# Patient Record
Sex: Female | Born: 1945 | Race: White | Hispanic: No | Marital: Married | Smoking: Never smoker
Health system: Southern US, Community
[De-identification: ages and names within clinical notes are randomized; demographics above are authoritative.]

## PROBLEM LIST (undated history)

## (undated) DIAGNOSIS — Z8719 Personal history of other diseases of the digestive system: Secondary | ICD-10-CM

## (undated) DIAGNOSIS — M8580 Other specified disorders of bone density and structure, unspecified site: Secondary | ICD-10-CM

## (undated) DIAGNOSIS — K219 Gastro-esophageal reflux disease without esophagitis: Secondary | ICD-10-CM

## (undated) DIAGNOSIS — K5730 Diverticulosis of large intestine without perforation or abscess without bleeding: Secondary | ICD-10-CM

## (undated) DIAGNOSIS — M5416 Radiculopathy, lumbar region: Secondary | ICD-10-CM

## (undated) DIAGNOSIS — F419 Anxiety disorder, unspecified: Secondary | ICD-10-CM

## (undated) DIAGNOSIS — K5903 Drug induced constipation: Secondary | ICD-10-CM

## (undated) DIAGNOSIS — Z9109 Other allergy status, other than to drugs and biological substances: Secondary | ICD-10-CM

## (undated) DIAGNOSIS — R112 Nausea with vomiting, unspecified: Secondary | ICD-10-CM

## (undated) DIAGNOSIS — R42 Dizziness and giddiness: Secondary | ICD-10-CM

## (undated) DIAGNOSIS — E785 Hyperlipidemia, unspecified: Secondary | ICD-10-CM

## (undated) DIAGNOSIS — Z9889 Other specified postprocedural states: Secondary | ICD-10-CM

## (undated) HISTORY — PX: COLONOSCOPY W/ POLYPECTOMY: SHX1380

## (undated) HISTORY — DX: Hyperlipidemia, unspecified: E78.5

## (undated) HISTORY — DX: Diverticulosis of large intestine without perforation or abscess without bleeding: K57.30

## (undated) HISTORY — DX: Radiculopathy, lumbar region: M54.16

## (undated) HISTORY — DX: Other specified disorders of bone density and structure, unspecified site: M85.80

## (undated) HISTORY — PX: TUBAL LIGATION: SHX77

## (undated) HISTORY — DX: Gastro-esophageal reflux disease without esophagitis: K21.9

---

## 1976-09-04 HISTORY — PX: NASAL SINUS SURGERY: SHX719

## 1984-09-04 HISTORY — PX: ABDOMINAL HYSTERECTOMY: SHX81

## 1995-05-06 DIAGNOSIS — E785 Hyperlipidemia, unspecified: Secondary | ICD-10-CM

## 1995-05-06 HISTORY — DX: Hyperlipidemia, unspecified: E78.5

## 2002-01-23 HISTORY — PX: HAND SURGERY: SHX662

## 2002-01-28 ENCOUNTER — Emergency Department (HOSPITAL_COMMUNITY): Admission: AC | Admit: 2002-01-28 | Discharge: 2002-01-28 | Payer: Self-pay

## 2002-01-28 ENCOUNTER — Encounter: Payer: Self-pay | Admitting: Emergency Medicine

## 2003-09-05 DIAGNOSIS — K5730 Diverticulosis of large intestine without perforation or abscess without bleeding: Secondary | ICD-10-CM

## 2003-09-05 HISTORY — DX: Diverticulosis of large intestine without perforation or abscess without bleeding: K57.30

## 2004-06-20 ENCOUNTER — Ambulatory Visit: Payer: Self-pay | Admitting: Obstetrics and Gynecology

## 2004-07-25 ENCOUNTER — Ambulatory Visit: Payer: Self-pay | Admitting: Family Medicine

## 2004-09-23 ENCOUNTER — Ambulatory Visit: Payer: Self-pay | Admitting: Family Medicine

## 2004-10-05 ENCOUNTER — Ambulatory Visit: Payer: Self-pay | Admitting: Family Medicine

## 2004-10-05 LAB — CONVERTED CEMR LAB: Hgb A1c MFr Bld: 5.7 %

## 2004-10-12 ENCOUNTER — Ambulatory Visit: Payer: Self-pay | Admitting: Family Medicine

## 2004-10-14 ENCOUNTER — Ambulatory Visit: Payer: Self-pay | Admitting: Family Medicine

## 2004-12-03 ENCOUNTER — Encounter: Payer: Self-pay | Admitting: Family Medicine

## 2004-12-03 LAB — CONVERTED CEMR LAB

## 2005-06-04 ENCOUNTER — Encounter: Payer: Self-pay | Admitting: Family Medicine

## 2005-06-04 LAB — CONVERTED CEMR LAB
Hgb A1c MFr Bld: 5.5 %
Urinalysis: 2.3

## 2005-06-13 ENCOUNTER — Ambulatory Visit: Payer: Self-pay | Admitting: Family Medicine

## 2005-06-15 ENCOUNTER — Ambulatory Visit: Payer: Self-pay | Admitting: Family Medicine

## 2005-06-29 ENCOUNTER — Ambulatory Visit: Payer: Self-pay | Admitting: Family Medicine

## 2005-07-20 ENCOUNTER — Ambulatory Visit: Payer: Self-pay | Admitting: Obstetrics and Gynecology

## 2005-10-09 ENCOUNTER — Ambulatory Visit: Payer: Self-pay | Admitting: Family Medicine

## 2006-01-02 ENCOUNTER — Encounter: Payer: Self-pay | Admitting: Family Medicine

## 2006-01-02 LAB — CONVERTED CEMR LAB: Hgb A1c MFr Bld: 6 %

## 2006-01-03 ENCOUNTER — Ambulatory Visit: Payer: Self-pay | Admitting: Family Medicine

## 2006-01-25 ENCOUNTER — Ambulatory Visit: Payer: Self-pay | Admitting: Family Medicine

## 2006-07-05 ENCOUNTER — Encounter: Payer: Self-pay | Admitting: Family Medicine

## 2006-07-05 LAB — CONVERTED CEMR LAB: Urinalysis: 4.4

## 2006-07-16 ENCOUNTER — Ambulatory Visit: Payer: Self-pay | Admitting: Family Medicine

## 2006-07-16 LAB — CONVERTED CEMR LAB: Hgb A1c MFr Bld: 6.4 %

## 2006-07-18 ENCOUNTER — Ambulatory Visit: Payer: Self-pay | Admitting: Family Medicine

## 2006-07-18 DIAGNOSIS — F411 Generalized anxiety disorder: Secondary | ICD-10-CM | POA: Insufficient documentation

## 2006-07-31 ENCOUNTER — Ambulatory Visit: Payer: Self-pay | Admitting: Obstetrics and Gynecology

## 2006-10-03 ENCOUNTER — Ambulatory Visit: Payer: Self-pay | Admitting: Family Medicine

## 2006-10-25 ENCOUNTER — Ambulatory Visit: Payer: Self-pay | Admitting: Family Medicine

## 2006-11-21 ENCOUNTER — Ambulatory Visit: Payer: Self-pay | Admitting: Family Medicine

## 2006-12-03 ENCOUNTER — Ambulatory Visit: Payer: Self-pay | Admitting: Unknown Physician Specialty

## 2007-01-07 ENCOUNTER — Encounter: Payer: Self-pay | Admitting: Family Medicine

## 2007-01-07 DIAGNOSIS — K219 Gastro-esophageal reflux disease without esophagitis: Secondary | ICD-10-CM

## 2007-01-07 DIAGNOSIS — E119 Type 2 diabetes mellitus without complications: Secondary | ICD-10-CM

## 2007-01-09 DIAGNOSIS — J309 Allergic rhinitis, unspecified: Secondary | ICD-10-CM | POA: Insufficient documentation

## 2007-01-09 DIAGNOSIS — N6019 Diffuse cystic mastopathy of unspecified breast: Secondary | ICD-10-CM

## 2007-01-09 DIAGNOSIS — N958 Other specified menopausal and perimenopausal disorders: Secondary | ICD-10-CM | POA: Insufficient documentation

## 2007-01-14 ENCOUNTER — Ambulatory Visit: Payer: Self-pay | Admitting: Family Medicine

## 2007-01-14 LAB — CONVERTED CEMR LAB
AST: 18 units/L (ref 0–37)
CO2: 28 meq/L (ref 19–32)
Calcium: 9.2 mg/dL (ref 8.4–10.5)
GFR calc Af Amer: 94 mL/min
GFR calc non Af Amer: 78 mL/min
Glucose, Bld: 153 mg/dL — ABNORMAL HIGH (ref 70–99)
Sodium: 144 meq/L (ref 135–145)

## 2007-01-16 ENCOUNTER — Ambulatory Visit: Payer: Self-pay | Admitting: Family Medicine

## 2007-03-21 ENCOUNTER — Ambulatory Visit: Payer: Self-pay | Admitting: Family Medicine

## 2007-03-21 ENCOUNTER — Encounter (INDEPENDENT_AMBULATORY_CARE_PROVIDER_SITE_OTHER): Payer: Self-pay | Admitting: *Deleted

## 2007-04-02 ENCOUNTER — Telehealth (INDEPENDENT_AMBULATORY_CARE_PROVIDER_SITE_OTHER): Payer: Self-pay | Admitting: *Deleted

## 2007-06-03 ENCOUNTER — Telehealth (INDEPENDENT_AMBULATORY_CARE_PROVIDER_SITE_OTHER): Payer: Self-pay | Admitting: *Deleted

## 2007-07-26 ENCOUNTER — Ambulatory Visit: Payer: Self-pay | Admitting: Family Medicine

## 2007-07-26 DIAGNOSIS — E782 Mixed hyperlipidemia: Secondary | ICD-10-CM | POA: Insufficient documentation

## 2007-07-27 LAB — CONVERTED CEMR LAB
ALT: 12 units/L (ref 0–35)
AST: 17 units/L (ref 0–37)
CO2: 28 meq/L (ref 19–32)
Calcium: 9.7 mg/dL (ref 8.4–10.5)
Hgb A1c MFr Bld: 6.3 % — ABNORMAL HIGH (ref 4.6–6.0)
Microalb, Ur: 1.2 mg/dL (ref 0.0–1.9)
Potassium: 4.6 meq/L (ref 3.5–5.1)
Total CHOL/HDL Ratio: 4.2
Triglycerides: 322 mg/dL (ref 0–149)
VLDL: 64 mg/dL — ABNORMAL HIGH (ref 0–40)

## 2007-07-31 ENCOUNTER — Ambulatory Visit: Payer: Self-pay | Admitting: Family Medicine

## 2007-08-15 ENCOUNTER — Ambulatory Visit: Payer: Self-pay | Admitting: Obstetrics and Gynecology

## 2007-08-23 ENCOUNTER — Ambulatory Visit: Payer: Self-pay | Admitting: Family Medicine

## 2007-08-23 LAB — FECAL OCCULT BLOOD, GUAIAC: Fecal Occult Blood: NEGATIVE

## 2007-08-26 ENCOUNTER — Encounter (INDEPENDENT_AMBULATORY_CARE_PROVIDER_SITE_OTHER): Payer: Self-pay | Admitting: *Deleted

## 2007-09-06 ENCOUNTER — Telehealth (INDEPENDENT_AMBULATORY_CARE_PROVIDER_SITE_OTHER): Payer: Self-pay | Admitting: *Deleted

## 2007-10-20 ENCOUNTER — Other Ambulatory Visit: Payer: Self-pay

## 2007-10-20 ENCOUNTER — Observation Stay: Payer: Self-pay | Admitting: Internal Medicine

## 2007-10-21 ENCOUNTER — Ambulatory Visit: Payer: Self-pay | Admitting: Internal Medicine

## 2007-10-21 ENCOUNTER — Encounter: Payer: Self-pay | Admitting: Family Medicine

## 2007-11-22 ENCOUNTER — Ambulatory Visit: Payer: Self-pay | Admitting: Family Medicine

## 2007-11-22 LAB — CONVERTED CEMR LAB: Hgb A1c MFr Bld: 6.3 % — ABNORMAL HIGH (ref 4.6–6.0)

## 2007-11-27 ENCOUNTER — Ambulatory Visit: Payer: Self-pay | Admitting: Family Medicine

## 2007-12-27 ENCOUNTER — Ambulatory Visit: Payer: Self-pay | Admitting: Family Medicine

## 2008-05-18 ENCOUNTER — Telehealth: Payer: Self-pay | Admitting: Family Medicine

## 2008-08-06 ENCOUNTER — Ambulatory Visit: Payer: Self-pay | Admitting: Family Medicine

## 2008-08-06 LAB — CONVERTED CEMR LAB
ALT: 17 units/L (ref 0–35)
AST: 27 units/L (ref 0–37)
BUN: 10 mg/dL (ref 6–23)
Basophils Relative: 0.5 % (ref 0.0–3.0)
CO2: 27 meq/L (ref 19–32)
Calcium: 9.2 mg/dL (ref 8.4–10.5)
Chloride: 109 meq/L (ref 96–112)
Creatinine, Ser: 0.8 mg/dL (ref 0.4–1.2)
Creatinine,U: 128.5 mg/dL
Direct LDL: 109.8 mg/dL
Eosinophils Relative: 2.1 % (ref 0.0–5.0)
Glucose, Bld: 128 mg/dL — ABNORMAL HIGH (ref 70–99)
Hemoglobin: 14.7 g/dL (ref 12.0–15.0)
Lymphocytes Relative: 29.9 % (ref 12.0–46.0)
MCHC: 34.8 g/dL (ref 30.0–36.0)
Microalb, Ur: 0.6 mg/dL (ref 0.0–1.9)
Monocytes Relative: 7.8 % (ref 3.0–12.0)
Neutro Abs: 4.7 10*3/uL (ref 1.4–7.7)
Neutrophils Relative %: 59.7 % (ref 43.0–77.0)
RBC: 4.85 M/uL (ref 3.87–5.11)
Total CHOL/HDL Ratio: 4.2
Total Protein: 6.9 g/dL (ref 6.0–8.3)
VLDL: 41 mg/dL — ABNORMAL HIGH (ref 0–40)
WBC: 7.8 10*3/uL (ref 4.5–10.5)

## 2008-08-10 ENCOUNTER — Ambulatory Visit: Payer: Self-pay | Admitting: Family Medicine

## 2008-08-12 ENCOUNTER — Telehealth: Payer: Self-pay | Admitting: Family Medicine

## 2008-09-15 ENCOUNTER — Ambulatory Visit: Payer: Self-pay | Admitting: Obstetrics and Gynecology

## 2008-09-15 ENCOUNTER — Encounter: Payer: Self-pay | Admitting: Family Medicine

## 2008-09-22 ENCOUNTER — Ambulatory Visit: Payer: Self-pay | Admitting: Family Medicine

## 2008-09-30 ENCOUNTER — Ambulatory Visit: Payer: Self-pay | Admitting: Family Medicine

## 2008-12-07 ENCOUNTER — Telehealth: Payer: Self-pay | Admitting: Family Medicine

## 2009-01-18 ENCOUNTER — Telehealth: Payer: Self-pay | Admitting: Family Medicine

## 2009-02-17 ENCOUNTER — Ambulatory Visit: Payer: Self-pay | Admitting: Family Medicine

## 2009-02-17 LAB — CONVERTED CEMR LAB
Basophils Absolute: 0 10*3/uL (ref 0.0–0.1)
Basophils Relative: 0.6 % (ref 0.0–3.0)
HCT: 42.5 % (ref 36.0–46.0)
Hemoglobin: 14.8 g/dL (ref 12.0–15.0)
Lymphocytes Relative: 26.6 % (ref 12.0–46.0)
Lymphs Abs: 2.2 10*3/uL (ref 0.7–4.0)
MCHC: 34.9 g/dL (ref 30.0–36.0)
Monocytes Relative: 9.6 % (ref 3.0–12.0)
Neutro Abs: 4.9 10*3/uL (ref 1.4–7.7)
RBC: 4.83 M/uL (ref 3.87–5.11)
RDW: 12.8 % (ref 11.5–14.6)

## 2009-02-24 ENCOUNTER — Ambulatory Visit: Payer: Self-pay | Admitting: Family Medicine

## 2009-04-05 ENCOUNTER — Telehealth: Payer: Self-pay | Admitting: Family Medicine

## 2009-07-21 ENCOUNTER — Ambulatory Visit: Payer: Self-pay | Admitting: Family Medicine

## 2009-07-22 LAB — CONVERTED CEMR LAB
ALT: 17 units/L (ref 0–35)
Albumin: 4 g/dL (ref 3.5–5.2)
Alkaline Phosphatase: 66 units/L (ref 39–117)
BUN: 10 mg/dL (ref 6–23)
Basophils Relative: 0.9 % (ref 0.0–3.0)
Bilirubin, Direct: 0 mg/dL (ref 0.0–0.3)
Direct LDL: 132.4 mg/dL
Eosinophils Absolute: 0.1 10*3/uL (ref 0.0–0.7)
Eosinophils Relative: 1.7 % (ref 0.0–5.0)
GFR calc non Af Amer: 67.16 mL/min (ref >60)
HCT: 45.3 % (ref 36.0–46.0)
Hemoglobin: 14.9 g/dL (ref 12.0–15.0)
Hgb A1c MFr Bld: 6.4 % (ref 4.6–6.5)
Lymphs Abs: 2.3 10*3/uL (ref 0.7–4.0)
MCHC: 32.8 g/dL (ref 30.0–36.0)
MCV: 90.5 fL (ref 78.0–100.0)
Microalb, Ur: 1.3 mg/dL (ref 0.0–1.9)
Monocytes Absolute: 0.6 10*3/uL (ref 0.1–1.0)
Neutro Abs: 5.1 10*3/uL (ref 1.4–7.7)
Potassium: 4.8 meq/L (ref 3.5–5.1)
RBC: 5.01 M/uL (ref 3.87–5.11)
Sodium: 146 meq/L — ABNORMAL HIGH (ref 135–145)
Total Protein: 7.2 g/dL (ref 6.0–8.3)
Vit D, 25-Hydroxy: 8 ng/mL — ABNORMAL LOW (ref 30–89)
WBC: 8.2 10*3/uL (ref 4.5–10.5)

## 2009-08-04 ENCOUNTER — Ambulatory Visit: Payer: Self-pay | Admitting: Family Medicine

## 2009-10-12 ENCOUNTER — Ambulatory Visit: Payer: Self-pay | Admitting: Family Medicine

## 2009-10-12 DIAGNOSIS — E559 Vitamin D deficiency, unspecified: Secondary | ICD-10-CM | POA: Insufficient documentation

## 2009-10-12 LAB — CONVERTED CEMR LAB
ALT: 13 units/L (ref 0–35)
AST: 19 units/L (ref 0–37)

## 2009-10-13 LAB — CONVERTED CEMR LAB: Vit D, 25-Hydroxy: 41 ng/mL

## 2009-12-06 ENCOUNTER — Ambulatory Visit: Payer: Self-pay | Admitting: Family Medicine

## 2009-12-06 LAB — CONVERTED CEMR LAB
Cholesterol: 223 mg/dL — ABNORMAL HIGH (ref 0–200)
Direct LDL: 146.1 mg/dL
Total CHOL/HDL Ratio: 4

## 2009-12-07 LAB — CONVERTED CEMR LAB: Vit D, 25-Hydroxy: 46 ng/mL (ref 30–89)

## 2009-12-09 ENCOUNTER — Ambulatory Visit: Payer: Self-pay | Admitting: Family Medicine

## 2009-12-15 ENCOUNTER — Ambulatory Visit: Payer: Self-pay | Admitting: Obstetrics and Gynecology

## 2009-12-21 ENCOUNTER — Ambulatory Visit: Payer: Self-pay | Admitting: Obstetrics and Gynecology

## 2009-12-24 ENCOUNTER — Encounter: Payer: Self-pay | Admitting: Family Medicine

## 2010-03-09 ENCOUNTER — Telehealth: Payer: Self-pay | Admitting: Family Medicine

## 2010-04-07 ENCOUNTER — Encounter (INDEPENDENT_AMBULATORY_CARE_PROVIDER_SITE_OTHER): Payer: Self-pay | Admitting: *Deleted

## 2010-05-31 ENCOUNTER — Ambulatory Visit: Payer: Self-pay | Admitting: Internal Medicine

## 2010-06-23 ENCOUNTER — Ambulatory Visit: Payer: Self-pay | Admitting: Obstetrics and Gynecology

## 2010-07-21 ENCOUNTER — Ambulatory Visit: Payer: Self-pay | Admitting: Family Medicine

## 2010-07-22 ENCOUNTER — Ambulatory Visit: Payer: Self-pay | Admitting: Family Medicine

## 2010-07-27 ENCOUNTER — Encounter: Payer: Self-pay | Admitting: Family Medicine

## 2010-08-08 ENCOUNTER — Ambulatory Visit: Payer: Self-pay | Admitting: Family Medicine

## 2010-08-09 LAB — CONVERTED CEMR LAB
Albumin: 4 g/dL (ref 3.5–5.2)
Alkaline Phosphatase: 66 units/L (ref 39–117)
BUN: 17 mg/dL (ref 6–23)
Calcium: 9.3 mg/dL (ref 8.4–10.5)
Creatinine, Ser: 1 mg/dL (ref 0.4–1.2)
Creatinine,U: 141.9 mg/dL
GFR calc non Af Amer: 57.93 mL/min — ABNORMAL LOW (ref >60.00)
Glucose, Bld: 154 mg/dL — ABNORMAL HIGH (ref 70–99)
HDL: 43.4 mg/dL (ref >39.00)
Hgb A1c MFr Bld: 7.1 % — ABNORMAL HIGH (ref 4.6–6.5)
Microalb Creat Ratio: 0.4 mg/g (ref 0.0–30.0)
Microalb, Ur: 0.5 mg/dL (ref 0.0–1.9)
Sodium: 139 meq/L (ref 135–145)
Triglycerides: 318 mg/dL — ABNORMAL HIGH (ref 0.0–149.0)

## 2010-08-10 ENCOUNTER — Ambulatory Visit: Payer: Self-pay | Admitting: Family Medicine

## 2010-08-10 DIAGNOSIS — M7710 Lateral epicondylitis, unspecified elbow: Secondary | ICD-10-CM | POA: Insufficient documentation

## 2010-08-17 ENCOUNTER — Encounter: Payer: Self-pay | Admitting: Family Medicine

## 2010-10-04 NOTE — Assessment & Plan Note (Signed)
Summary: Sarah Velasquez PPD GIVE TO [name]  Nurse Visit   Allergies: 1)  ! Omeprazole (Omeprazole) 2)  Demerol (Meperidine Hcl) 3)  * Tricor (Fenofibrate Micronized) 4)  Zithromax (Azithromycin)  Immunizations Administered:  PPD Skin Test:    Vaccine Type: PPD    Site: left forearm    Mfr: Sanofi Pasteur    Dose: 0.1 ml    Route: ID    Given by: Delilah Shan CMA (AAMA)    Exp. Date: 07/07/2011    Lot #: [id number]  PPD Results    Date of reading: 07/25/2010    Interpretation: negative  Orders Added: 1)  TB Skin Test [86580] 2)  Admin 1st Vaccine [47829]

## 2010-10-04 NOTE — Assessment & Plan Note (Signed)
Summary: 4 MONTH FOLLOW UP/[name]   Vital Signs:  Patient profile:   65 year old female Weight:      173.75 pounds BMI:     29.47 Temp:     97.9 degrees F oral Pulse rate:   76 / minute Pulse rhythm:   regular BP sitting:   118 / 74  (left arm) Cuff size:   large  Vitals Entered By: Sydell Axon LPN (December 09, 1608 8:19 AM) CC: 4 Month follow-up after lab work   History of Present Illness: Pt here for followup. She lost her job and started getting insurance last week, paying for it herself...she is paying for her insurance via [employer] and her husband must be insured also and is demented and uncoverable. She is depressed about work and jobs but cannot find a job...is currently on unemployment. She has taken a class for substitute teaching.  She checks her sugar at home.  Allergies: 1)  ! Omeprazole (Omeprazole) 2)  Demerol (Meperidine Hcl) 3)  * Tricor (Fenofibrate Micronized) 4)  Zithromax (Azithromycin)   Impression & Recommendations:  Problem # 1:  UNSPECIFIED VITAMIN D DEFICIENCY (ICD-268.9) Assessment Improved Now go to Vit D 1000Iu two times a day. Will check Vit D again next Fall.  Problem # 2:  MIXED HYPERLIPIDEMIA (ICD-272.2) Assessment: Deteriorated  Simvastatin not doing the job. She wants to go back to Vytorin which helped her. Scipt sent.  She has tolerated this ok in the past. Will check again in the Fall. The following medications were removed from the medication list:    Simvastatin 80 Mg Tabs (Simvastatin) ..... One tab by mouth at night. Her updated medication list for this problem includes:    Vytorin 10-80 Mg Tabs (Ezetimibe-simvastatin) ..... One tab by mouth at night  Labs Reviewed: SGOT: 22 (12/06/2009)   SGPT: 15 (12/06/2009)   HDL:52.50 (12/06/2009), 47.30 (07/21/2009)  LDL:DEL (08/06/2008), DEL (07/26/2007)  Chol:223 (12/06/2009), 204 (07/21/2009)  Trig:286.0 (12/06/2009), 236.0 (07/21/2009)  Problem # 3:  DIABETES MELLITUS, TYPE II  (ICD-250.00) Assessment: Unchanged Stable. Script written for new machine. Nos currently stable, typically running 120's in AM, occas 140. If nos trend higher, incr Metformin as dir, incr AM if PM nos high, PM if AM nos high...discussed. Will recheck in the Fall. Her updated medication list for this problem includes:    Metformin Hcl 500 Mg Tabs (Metformin hcl) ..... One tab by mouth at am and pm.  Complete Medication List: 1)  Prevacid 30 Mg Cpdr (Lansoprazole) .... Take one by mouth  daily 2)  Estradiol 2 Mg Tabs (Estradiol) .... Take 1 tablet by mouth once a day 3)  Zyrtec 10 Mg Tabs (Cetirizine hcl) .... One tab by mouth once daily 4)  Metformin Hcl 500 Mg Tabs (Metformin hcl) .... One tab by mouth at am and pm. 5)  Zantac 150 Maximum Strength 150 Mg Tabs (Ranitidine hcl) .Marland Kitchen.. 1 tablet at bedtime 6)  Buspar 15 Mg Tabs (Buspirone hcl) .Marland Kitchen.. 1 tab by mouth  two times a day,  take consistently with regard to food. 7)  Flonase 50 Mcg/act Susp (Fluticasone propionate) .... 2 sprays each nostril  daily 8)  Astepro 0.15 % Soln (Azelastine hcl) .... 2 sprays each nostril two times a day 9)  Freestyle Test Strp (Glucose blood) .... Check sugar daily and if needed for  for symptoms of elevated sugar are low sugar icd9 code 250.00 10)  Freestyle Lancets Misc (Lancets) .... Use as directed two times a  day 11)  Ergocalciferol 50000 Unit Caps (Ergocalciferol) .... One tab by mouth once weekly 12)  Vytorin 10-80 Mg Tabs (Ezetimibe-simvastatin) .... One tab by mouth at night  Patient Instructions: 1)  Get new meter today. 2)  RTC in the Fall, usual time for Comp Exam. Call in May for appt. Prescriptions: VYTORIN 10-80 MG TABS (EZETIMIBE-SIMVASTATIN) one tab by mouth at night  #30 x 12   Entered and Authorized by:   Shaune Leeks MD   Signed by:   Shaune Leeks MD on 12/09/2009   Method used:   [facility]        [facility] [phone]* (retail)       [address] [place], [place]  [address]       Ph: [phone] or [phone]       Fax: [phone]   RxID:   [id number]   Current Allergies (reviewed today): ! OMEPRAZOLE (OMEPRAZOLE) DEMEROL (MEPERIDINE HCL) * TRICOR (FENOFIBRATE MICRONIZED) ZITHROMAX (AZITHROMYCIN)

## 2010-10-04 NOTE — Letter (Signed)
Summary: Nadara Eaton letter  [facility]  [address] [place], [place] [address]   Phone: [phone]  Fax: [phone]       04/07/2010 MRN: [id number]  DENECIA BRUNETTE [address] [place], [place]  [address]  Dear Ms. [facility] [place], and [facility] announce the retirement of Arta Silence, M.D., from full-time practice at the [facility] office effective March 03, 2010 and his plans of returning part-time.  It is important to Dr. Hetty Ely and to our practice that you understand that [facility] has seven physicians in our office for your health care needs.  We will continue to offer the same exceptional care that you have today.    Dr. Hetty Ely has spoken to many of you about his plans for retirement and returning part-time in the fall.   We will continue to work with you through the transition to schedule appointments for you in the office and meet the high standards that [facility] is committed to.   Again, it is with great pleasure that we share the news that Dr. Hetty Ely will return to [facility] in October of 2011 with a reduced schedule.    If you have any questions, or would like to request an appointment with one of our physicians, please call us at [phone] and press the option for Scheduling an appointment.  We take pleasure in providing you with excellent patient care and look forward to seeing you at your next office visit.  Our [facility] Physicians are:  Tillman Abide, M.D. Laurita Quint, M.D. Roxy Manns, M.D. Kerby Nora, M.D. Hannah Beat, M.D. Ruthe Mannan, M.D. We proudly welcomed Raechel Ache, M.D. and Eustaquio Boyden, M.D. to the practice in July/August 2011.  Sincerely,  [facility]

## 2010-10-04 NOTE — Assessment & Plan Note (Signed)
Summary: CPX/CLE   Vital Signs:  Patient profile:   65 year old female Weight:      175.25 pounds Temp:     97.0 degrees F oral Pulse rate:   76 / minute Pulse rhythm:   regular BP sitting:   126 / 78  (left arm) Cuff size:   large  Vitals Entered By: Sydell Axon LPN (August 10, 2010 9:29 AM) CC: 30 Minute checkup, sees Dr. Logan Bores for her GYN care, had a colonoscopy 3/08 by Dr. Mechele Collin   History of Present Illness: Pt here for Comp Exam. She sees Dr Logan Bores for Gyn care, last seen in Sep.  Hasn't had Pap in long time...told she doesn't need one. Had repeat Mammo with U/s which was then fine.  Her A1C is elevated ...no change in diet but gets minimal exercise. She is home all day. She doesn't snack much...she eats sweets and realizes she shouldn't. She has not substituted in school yet. She had orientation last week. She is still in the checkout phase. Husband is still having difficulty, especially at night...lots of stress.  Preventive Screening-Counseling & Management  Alcohol-Tobacco     Alcohol drinks/day: <1     Alcohol type: rare wine once a year     Smoking Status: never     Passive Smoke Exposure: no  Caffeine-Diet-Exercise     Caffeine use/day: rare     Does Patient Exercise: no     Type of exercise: walks     Times/week: 7  Problems Prior to Update: 1)  Lateral Epicondylitis, Left  (ICD-726.32) 2)  Diabetes Mellitus, Type II  (ICD-250.00) 3)  Mixed Hyperlipidemia  (ICD-272.2) 4)  Gerd  (ICD-530.81) 5)  Unspecified Vitamin D Deficiency  (ICD-268.9) 6)  Anxiety Disorder, Generalized  (ICD-300.02) 7)  Disorder, 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 Take One By Mouth  Daily 2)  Estradiol 2 Mg Tabs (Estradiol) .... Take 1/2  Tablet By Mouth Once A Day 3)  Zyrtec 10 Mg Tabs (Cetirizine Hcl) .... One Tab By Mouth Once Daily 4)  Metformin Hcl 500 Mg Tabs (Metformin Hcl) .... One Tab By Mouth At Am and Pm. 5)  Zantac 150 Maximum Strength 150 Mg  Tabs (Ranitidine Hcl) .Marland Kitchen.. 1 Tablet At Bedtime 6)  Buspar 15 Mg Tabs (Buspirone Hcl) .... 1/2 Tab By Mouth  Two Times A Day,  Take Consistently With Regard To Food. 7)  Freestyle Test  Strp (Glucose Blood) .... Check Sugar Daily and If Needed For  For Symptoms of Elevated Sugar Are Low Sugar Icd9 Code 250.00 8)  Freestyle Lancets  Misc (Lancets) .... Use As Directed Two Times A Day 9)  Vytorin 10-80 Mg Tabs (Ezetimibe-Simvastatin) .... One Tab By Mouth At Night 10)  Vitamin D 1000 Unit Tabs (Cholecalciferol) .Marland Kitchen.. 1 By Mouth Two Times A Day  Current Medications (verified): 1)  Prevacid 30 Mg Cpdr (Lansoprazole) .... Take One By Mouth  Daily 2)  Estradiol 2 Mg Tabs (Estradiol) .... Take 1/2  Tablet By Mouth Once A Day 3)  Zyrtec 10 Mg Tabs (Cetirizine Hcl) .... One Tab By Mouth Once Daily 4)  Metformin Hcl 500 Mg Tabs (Metformin Hcl) .... One  Tab By Mouth At Am and Pm. 5)  Zantac 150 Maximum Strength 150 Mg  Tabs (Ranitidine Hcl) .Marland Kitchen.. 1 Tablet At Bedtime 6)  Buspar 15 Mg Tabs (Buspirone Hcl) .... 1/2 Tab By Mouth  Two Times A Day,  Take Consistently With Regard To Food. 7)  Freestyle Test  Strp (Glucose Blood) .... Check Sugar Daily and If Needed For  For Symptoms of Elevated Sugar Are Low Sugar Icd9 Code 250.00 8)  Freestyle Lancets  Misc (Lancets) .... Use As Directed Two Times A Day 9)  Vytorin 10-80 Mg Tabs (Ezetimibe-Simvastatin) .... One Tab By Mouth At Night 10)  Vitamin D 1000 Unit Tabs (Cholecalciferol) .Marland Kitchen.. 1 By Mouth Two Times A Day  Allergies: 1)  ! Omeprazole (Omeprazole) 2)  Demerol (Meperidine Hcl) 3)  * Tricor (Fenofibrate Micronized) 4)  Zithromax (Azithromycin)  Past  History:  Past Medical History: Last updated: 01/07/2007 GERD Diabetes mellitus, type II Diverticulosis, colon (09/2003) Hyperlipidemia (05/1995)  Past Surgical History: Last updated: 10/25/2007 NSVDx2 BTL Sinus opn 1978 Hyst, ovaries intact Uterine prolapse  1986 EGD- H. H., esophagitis/ Sigmoidoscopy WNL (Dr. Mechele Collin)  03/04/1996 ABD. U/S WNL 10/97 EGD, Sm. H. H., multi polyps 09/16/2003 Colonoscopy polyps, multi. benign, int. hemorrhoids, divertics .09/16/03 Stress cardiolite, apical thinning, negative ischemia 09/14/2003 Colonoscoopy Ext/Int. hemorrhoids Mechele Collin) 5 years 12/03/2006 [facility]) Chest Pain R/O'd  GERD 2/15-2/16/2009 Stress Myoview negative 10/21/2007  Family History: Last updated: 03-Sep-2010 Father: Died at age 42, CABG, emphysema, esophageal and brain cancer  Mother :Died at age 54 MI Siblings: Brother Clerance Lav) A 46  MI               Sister A 31 (Etoh)  Social History: Last updated: 2010/09/03 Marital Status: Married, lives with husband Children: Son age 72 married [place], one daughter Occupation:  CSR (Museum/gallery conservator) [employer]    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    Downsized Caffeine use/day:  rare Does Patient Exercise:  no  Review of Systems General:  Complains of fatigue; denies chills, fever, sweats, weakness, and weight loss; Poor sleep from husband's disease.. Eyes:  Denies  blurring, discharge, and eye pain; chronic right eye tearing. ENT:  Denies decreased hearing, ear discharge, earache, and ringing in ears. CV:  Denies chest pain or discomfort, fainting, palpitations, shortness of breath with exertion, swelling of feet, and swelling of hands. Resp:  Denies cough, shortness of breath, and wheezing. GI:  Denies abdominal pain, bloody stools, change in bowel habits, constipation, dark tarry stools, diarrhea, indigestion, loss of appetite, nausea, vomiting, vomiting blood, and yellowish skin color. GU:  Denies discharge, dysuria, nocturia, and urinary frequency; bladder problrem with prolapsed bladder and rectum. . MS:  Denies joint pain, muscle aches, muscle weakness, and stiffness. Derm:  Denies dryness, itching, and rash. Neuro:  Denies numbness, poor balance, tingling, and tremors.  Physical Exam  General:  alert, well-developed, well-nourished, and well-hydrated.  NAD  Head:  Normocephalic and atraumatic without obvious abnormalities. No apparent alopecia or balding. Sinuses NT. Eyes:  Conjunctiva clear bilaterally.  Ears:  External ear exam shows no significant lesions or deformities.  Otoscopic examination reveals clear canals, tympanic membranes are intact bilaterally without bulging, retraction, inflammation or discharge. Hearing is grossly normal bilaterally. Nose:  External nasal examination shows no deformity or inflammation. Nasal mucosa are pink and moist without lesions or exudates. Mouth:  Oral mucosa and oropharynx without lesions or exudates.  Teeth in good repair. Neck:  No deformities, masses, or tenderness noted. Chest Wall:  No deformities, masses, or tenderness noted. Breasts:  Not done, Dr Logan Bores Lungs:  Normal respiratory effort, chest expands symmetrically. Lungs are clear to auscultation, no crackles or wheezes. Heart:  Normal rate and regular rhythm. S1 and S2 normal without gallop, murmur, click, rub or other extra sounds. Abdomen:   abdomen soft and non-tender without masses, organomegaly or hernias noted. Rectal:  Not done, Dr Sanjuana Kava:  Not done, Dr Logan Bores Msk:  No deformity or scoliosis noted of thoracic or lumbar spine.   Pulses:  R and L carotid,radial,femoral,dorsalis pedis and posterior tibial pulses are full and equal bilaterally Extremities:  No clubbing, cyanosis, edema noted with normal full range of motion of all joints.  Surgical correction of hand trauma with finger for opposable thumb on right hand. Neurologic:  No cranial nerve deficits noted. Station and gait are normal. Sensory, motor and coordinative functions appear intact. Skin:  Intact without suspicious lesions or rashes Cervical Nodes:  No lymphadenopathy noted Inguinal Nodes:  No significant adenopathy Psych:  Cognition and judgment appear intact. Alert and cooperative with normal attention span and concentration. No apparent delusions, illusions, hallucinations, anxious and tearful discussing her husband's situation and status.  Diabetes Management Exam:    Foot Exam (with socks and/or shoes not present):       Sensory-Pinprick/Light touch:          Left medial foot (L-4): normal          Left dorsal foot (L-5): normal          Left lateral foot (S-1): normal          Right medial foot (L-4): normal          Right dorsal foot (L-5): normal          Right lateral foot (S-1): normal       Sensory-Monofilament:          Left foot: normal          Right foot: normal       Inspection:          Left foot: normal          Right foot: normal       Nails:          Left foot: normal          Right foot: normal   Impression & Recommendations:  Problem # 1:  HEALTH MAINTENANCE EXAM (ICD-V70.0) Assessment Comment Only Flu vaccine administered today. Discussed Zostavax. Will give Pneumovax  next year at 89 yoa.  Problem # 2:  LATERAL EPICONDYLITIS, LEFT (ICD-726.32) Assessment: New Is currently wearing an elbow strap. Will have her  seen at [facility] for poss inj tx. Orders: Orthopedic Referral (Ortho)  Problem # 3:  DIABETES MELLITUS, TYPE II (ICD-250.00) Assessment: Deteriorated Mildly elevated over previous values. Discussed motivation and control. Will increase Metformin. Her updated medication list for this problem includes:  Metformin Hcl 1000 Mg Tabs (Metformin hcl) ..... One tab by mouth two times a day.  Labs Reviewed: Creat: 1.0 (08/08/2010)     Last Eye Exam: normal (07/20/2009) Reviewed HgBA1c results: 7.1 (08/08/2010)  6.4 (07/21/2009)  Problem # 4:  MIXED HYPERLIPIDEMIA (ICD-272.2) Assessment: Unchanged Cont curr meds. Her updated medication list for this problem includes:    Vytorin 10-80 Mg Tabs (Ezetimibe-simvastatin) ..... One tab by mouth at night  Labs Reviewed: SGOT: 37 (08/08/2010)   SGPT: 19 (08/08/2010)   HDL:43.40 (08/08/2010), 52.50 (12/06/2009)  LDL:DEL (08/06/2008), DEL (07/26/2007)  Chol:185 (08/08/2010), 223 (12/06/2009)  Trig:318.0 (08/08/2010), 286.0 (12/06/2009)  Problem # 5:  GERD (ICD-530.81) Stable, again discussed conservative measures. Her updated medication list for this problem includes:    Prevacid 30 Mg Cpdr (Lansoprazole) .Marland Kitchen... Take one by mouth  daily    Zantac 150 Maximum Strength 150 Mg Tabs (Ranitidine hcl) .Marland Kitchen... 1 tablet at bedtime  Problem # 6:  UNSPECIFIED VITAMIN D DEFICIENCY (ICD-268.9) Assessment: Improved Adequately replaced. Cont curr tx.  Problem # 7:  ANXIETY DISORDER, GENERALIZED (ICD-300.02) Assessment: Unchanged Stable, doing well given the current situation. Will refer pt to [facility] for help with husband's care. Her updated medication list for this problem includes:    Buspar 15 Mg Tabs (Buspirone hcl) .Marland Kitchen... 1/2 tab by mouth  two times a day,  take consistently with regard to food.  Complete Medication List: 1)  Prevacid 30 Mg Cpdr (Lansoprazole) .... Take one by mouth  daily 2)  Estradiol 2 Mg Tabs (Estradiol) .... Take 1/2   tablet by mouth once a day 3)  Zyrtec 10 Mg Tabs (Cetirizine hcl) .... One tab by mouth once daily 4)  Metformin Hcl 1000 Mg Tabs (Metformin hcl) .... One tab by mouth two times a day. 5)  Zantac 150 Maximum Strength 150 Mg Tabs (Ranitidine hcl) .Marland Kitchen.. 1 tablet at bedtime 6)  Buspar 15 Mg Tabs (Buspirone hcl) .... 1/2 tab by mouth  two times a day,  take consistently with regard to food. 7)  Freestyle Test Strp (Glucose blood) .... Check sugar daily and if needed for  for symptoms of elevated sugar are low sugar icd9 code 250.00 8)  Freestyle Lancets Misc (Lancets) .... Use as directed two times a day 9)  Vytorin 10-80 Mg Tabs (Ezetimibe-simvastatin) .... One tab by mouth at night 10)  Vitamin D 1000 Unit Tabs (Cholecalciferol) .Marland Kitchen.. 1 by mouth two times a day  Other Orders: Admin 1st Vaccine (14782) Flu Vaccine 49yrs + ([address])  Patient Instructions: 1)  RTC 6mos, A1C prior 250.00 2)  Refer to Ortho. 3)  Will refer to [facility] 4)  Increase Metformin to 1000mg  two times a day. 5)  Look into Shingles shot.  6)  Flu shot today. Prescriptions: METFORMIN HCL 1000 MG TABS (METFORMIN HCL) one tab by mouth two times a day.  #60 x 12   Entered and Authorized by:   Shaune Leeks MD   Signed by:   Shaune Leeks MD on 08/10/2010   Method used:   [facility]        [facility] [phone]* (retail)       [address] [place], [place]  [address]       Ph: [phone] or [phone]       Fax: [phone]   RxID:   [id number]    Orders Added: 1)  Admin 1st Vaccine [90471] 2)  Flu Vaccine 87yrs + [62130] 3)  Orthopedic Referral [Ortho] 4)  Est. Patient 40-64 years [86578]    Current Allergies (reviewed today): ! OMEPRAZOLE (OMEPRAZOLE) DEMEROL (MEPERIDINE HCL) * TRICOR (FENOFIBRATE MICRONIZED) ZITHROMAX (AZITHROMYCIN)Flu Vaccine Consent Questions     Do you have a history of severe allergic reactions to this vaccine? no    Any prior  history of allergic reactions to egg and/or gelatin? no    Do you have a sensitivity to the preservative Thimersol? no    Do you have a past history of Guillan-Barre Syndrome? no    Do you currently have an acute febrile illness? no    Have you ever had a severe reaction to latex? no    Vaccine information given and explained to patient? yes    Are you currently pregnant? no    Lot Number:[id number]   Exp Date:03/04/2011   Site Given  Left Deltoid IM (AZITHROMYCIN)

## 2010-10-04 NOTE — Letter (Signed)
Summary: [facility] / UROGYNECOLOGY NEW PATIENT CONSULT / PELVIC PROLAPSE / DR.  [facility] / UROGYNECOLOGY NEW PATIENT CONSULT / PELVIC PROLAPSE / DR. AMIE KAWASAKI   Imported By: Carin Primrose 12/28/2009 15:58:08  _____________________________________________________________________  External Attachment:    Type:   Image     Comment:   External Document

## 2010-10-04 NOTE — Progress Notes (Signed)
Summary: Rx Buspar  Phone Note Refill Request Call back at [phone] Message from:  [facility] on March 09, 2010 11:12 AM  Refills Requested: Medication #1:  BUSPAR 15 MG TABS 1 tab by mouth  two times a day  Method Requested: Electronic Initial call taken by: Sydell Axon LPN,  March 09, 1190 11:17 AM  Follow-up for Phone Call       Follow-up by: Crawford Givens MD,  March 09, 2010 2:16 PM    Prescriptions: BUSPAR 15 MG TABS (BUSPIRONE HCL) 1 tab by mouth  two times a day,  take consistently with regard to food.  #60 x 5   Entered and Authorized by:   Crawford Givens MD   Signed by:   Crawford Givens MD on 03/09/2010   Method used:   [facility]        [facility] [phone]* (retail)       [address] [place], [place]  [address]       Ph: [phone] or [phone]       Fax: [phone]   RxID:   [id number]

## 2010-10-04 NOTE — Letter (Signed)
Summary: [place] Engineer, manufacturing systems  [employer] Health Examination Certificate   Imported By: Beau Fanny 07/27/2010 08:27:47  _____________________________________________________________________  External Attachment:    Type:   Image     Comment:   External Document

## 2010-10-04 NOTE — Assessment & Plan Note (Signed)
Summary: ST/CLE   Vital Signs:  Patient profile:   65 year old female Height:      64.5 inches Weight:      176.50 pounds Temp:     98.1 degrees F oral Pulse rate:   84 / minute Pulse rhythm:   regular BP sitting:   136 / 70  (left arm) Cuff size:   large  Vitals Entered By: Selena Batten Dance CMA Duncan Dull) (May 31, 2010 3:24 PM) CC: Sore throat   History of Present Illness: CC: ST  4d h/o ST, noted white spot on right tonsil.  No documented fevers/chills but feeling diaphoretic at night.  + mild cough, congestion.  Mild ear fullness on right.  + PND and nasal drainage (clear).  + sinus pressure.    No abd pain, n/v/d.  No myalgias, arthralgias.  No sick contacts.  No smokers at home.  Has tried gargling with salty water.    Current Medications (verified): 1)  Prevacid 30 Mg Cpdr (Lansoprazole) .... Take One By Mouth  Daily 2)  Estradiol 2 Mg Tabs (Estradiol) .... Take 1/2  Tablet By Mouth Once A Day 3)  Zyrtec 10 Mg Tabs (Cetirizine Hcl) .... One Tab By Mouth Once Daily 4)  Metformin Hcl 500 Mg Tabs (Metformin Hcl) .... One Tab By Mouth At Am and Pm. 5)  Zantac 150 Maximum Strength 150 Mg  Tabs (Ranitidine Hcl) .Marland Kitchen.. 1 Tablet At Bedtime 6)  Buspar 15 Mg Tabs (Buspirone Hcl) .... 1/2 Tab By Mouth  Two Times A Day,  Take Consistently With Regard To Food. 7)  Freestyle Test  Strp (Glucose Blood) .... Check Sugar Daily and If Needed For  For Symptoms of Elevated Sugar Are Low Sugar Icd9 Code 250.00 8)  Freestyle Lancets  Misc (Lancets) .... Use As Directed Two Times A Day 9)  Vytorin 10-80 Mg Tabs (Ezetimibe-Simvastatin) .... One Tab By Mouth At Night 10)  Vitamin D 1000 Unit Tabs (Cholecalciferol) .Marland Kitchen.. 1 By Mouth Two Times A Day  Allergies: 1)  ! Omeprazole (Omeprazole) 2)  Demerol (Meperidine Hcl) 3)  * Tricor (Fenofibrate Micronized) 4)  Zithromax (Azithromycin)  Past History:  Past Medical History: Last updated: 01/07/2007 GERD Diabetes mellitus, type II Diverticulosis,  colon (09/2003) Hyperlipidemia (05/1995)  Social History: Last updated: 08/04/2009 Marital Status: Married, lives with husband Children: Son age 25 married [place], one daughter Occupation:  CSR (Museum/gallery conservator) [employer]    Let GO PMH-FH-SH reviewed for relevance  Review of Systems       per HPI  Physical Exam  General:  alert, well-developed, well-nourished, and well-hydrated.  NAD Head:  Normocephalic and atraumatic without obvious abnormalities. No apparent alopecia or balding. Sinuses NT. Eyes:  Conjunctiva clear bilaterally.  Ears:  External ear exam shows no significant lesions or deformities.  Otoscopic examination reveals clear canals, tympanic membranes are intact bilaterally without bulging, retraction, inflammation or discharge. Hearing is grossly normal bilaterally. Nose:  no mucosal edema, no mucosal erythema.    Mouth:  + pharyngeal erythema.  + pale nodule on right tonsil, no exudates Neck:  No deformities, masses, or tenderness noted. Lungs:  Normal respiratory effort, chest expands symmetrically. Lungs are clear to auscultation, no crackles or wheezes. Heart:  Normal rate and regular rhythm. S1 and S2 normal without gallop, murmur, click, rub or other extra sounds. Abdomen:  abdomen soft and non-tender without masses, organomegaly or hernias noted. Pulses:  2+ rad pulses Extremities:  No clubbing, cyanosis, edema noted  with normal full range of motion of all joints.  Surgical correction of hand trauma with finger for opposable thumb on right hand. Skin:  Intact without suspicious lesions or rashes   Impression & Recommendations:  Problem # 1:  ACUTE PHARYNGITIS (ICD-462) rapid strep negative.  viral URTI.  treat supportively - ibuprofen, nasal saline, rest and fluids.  RTC for red flags.  discussed anticipated course of illness.  ? if allergies playing component (previously prescribed flonase and astelin, not currently  taking).  Orders: Rapid Strep (98119)  Complete Medication List: 1)  Prevacid 30 Mg Cpdr (Lansoprazole) .... Take one by mouth  daily 2)  Estradiol 2 Mg Tabs (Estradiol) .... Take 1/2  tablet by mouth once a day 3)  Zyrtec 10 Mg Tabs (Cetirizine hcl) .... One tab by mouth once daily 4)  Metformin Hcl 500 Mg Tabs (Metformin hcl) .... One tab by mouth at am and pm. 5)  Zantac 150 Maximum Strength 150 Mg Tabs (Ranitidine hcl) .Marland Kitchen.. 1 tablet at bedtime 6)  Buspar 15 Mg Tabs (Buspirone hcl) .... 1/2 tab by mouth  two times a day,  take consistently with regard to food. 7)  Freestyle Test Strp (Glucose blood) .... Check sugar daily and if needed for  for symptoms of elevated sugar are low sugar icd9 code 250.00 8)  Freestyle Lancets Misc (Lancets) .... Use as directed two times a day 9)  Vytorin 10-80 Mg Tabs (Ezetimibe-simvastatin) .... One tab by mouth at night 10)  Vitamin D 1000 Unit Tabs (Cholecalciferol) .Marland Kitchen.. 1 by mouth two times a day  Patient Instructions: 1)  Rapid strep today. 2)  Ibuprofen for throat inflammation.  Nasal saline for nose irritation.  Suck on cold things like popsicles, salt water gargles.  Plenty of rest and water. 3)  return if having high fevers >101.5, trouble breathing or swallowing, or other concerns.   4)  Pleasure to meet you today, call clinic with questions.  Current Allergies (reviewed today): ! OMEPRAZOLE (OMEPRAZOLE) DEMEROL (MEPERIDINE HCL) * TRICOR (FENOFIBRATE MICRONIZED) ZITHROMAX (AZITHROMYCIN)  Laboratory Results  Date/Time Received: May 31, 2010 4:04 PM Date/Time Reported: May 31, 2010 4:04 PM   Other Tests  Rapid Strep: negative

## 2010-10-04 NOTE — Assessment & Plan Note (Signed)
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 She has no back or musculoskeletal problems. She has lots of stress lately, esp with her husband. She otherwise feels well with no complaints.  Her shots are UTD. SHe has not had a PPD.  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 Take One By Mouth  Daily 2)  Estradiol 2 Mg Tabs (Estradiol) .... Take 1/2  Tablet By Mouth Once A Day 3)  Zyrtec 10 Mg Tabs (Cetirizine Hcl) .... One Tab By Mouth Once Daily 4)  Metformin Hcl 500 Mg Tabs (Metformin Hcl) .... One Tab By Mouth At Am and Pm. 5)  Zantac 150 Maximum Strength 150 Mg  Tabs (Ranitidine Hcl) .Marland Kitchen.. 1 Tablet At Bedtime 6)  Buspar 15 Mg Tabs (Buspirone Hcl) .... 1/2 Tab By Mouth  Two Times A Day,  Take Consistently With Regard To Food. 7)  Freestyle Test  Strp (Glucose Blood) .... Check Sugar Daily and If Needed For  For Symptoms of Elevated Sugar Are Low Sugar Icd9 Code  250.00 8)  Freestyle Lancets  Misc (Lancets) .... Use As Directed Two Times A Day 9)  Vytorin 10-80 Mg Tabs (Ezetimibe-Simvastatin) .... One Tab By Mouth At Night 10)  Vitamin D 1000 Unit Tabs (Cholecalciferol) .Marland Kitchen.. 1 By Mouth Two Times A Day  Current Medications (verified): 1)  Prevacid 30 Mg Cpdr (Lansoprazole) .... Take One By Mouth  Daily 2)  Estradiol 2 Mg Tabs (Estradiol) .... Take 1/2  Tablet By Mouth Once A Day 3)  Zyrtec 10 Mg Tabs (Cetirizine Hcl) .... One Tab By Mouth Once Daily 4)  Metformin Hcl 500 Mg Tabs (Metformin Hcl) .... One Tab By Mouth At Am and Pm. 5)  Zantac 150 Maximum Strength 150 Mg  Tabs (Ranitidine Hcl) .Marland Kitchen.. 1 Tablet At Bedtime 6)  Buspar 15 Mg Tabs (Buspirone Hcl) .... 1/2 Tab By Mouth  Two Times A Day,  Take Consistently With Regard To Food. 7)  Freestyle Test  Strp (Glucose Blood) .... Check Sugar Daily and If Needed For  For Symptoms of Elevated Sugar Are Low Sugar Icd9 Code 250.00 8)  Freestyle Lancets  Misc (Lancets) .... Use As Directed Two Times A Day 9)  Vytorin 10-80 Mg Tabs (Ezetimibe-Simvastatin) .... One Tab By Mouth At Night 10)  Vitamin D 1000 Unit Tabs (Cholecalciferol) .Marland Kitchen.. 1 By Mouth Two Times A Day  Allergies: 1)  ! Omeprazole (Omeprazole) 2)  Demerol (Meperidine Hcl) 3)  * Tricor (Fenofibrate Micronized) 4)  Zithromax (Azithromycin)  Physical Exam  General:  alert, well-developed, well-nourished, and well-hydrated.  NAD Head:  Normocephalic and atraumatic without obvious abnormalities. No apparent alopecia or balding. Sinuses NT. Eyes:  Conjunctiva clear bilaterally.  Ears:  External ear exam shows no significant lesions or deformities.  Otoscopic examination reveals clear canals, tympanic membranes are intact bilaterally without bulging, retraction, inflammation or discharge. Hearing is grossly normal bilaterally. Nose:  External nasal examination shows no deformity or inflammation. Nasal mucosa are pink and moist without lesions or  exudates. Mouth:  Oral mucosa and oropharynx without lesions or exudates.  Teeth in good repair. Neck:  No deformities, masses, or tenderness noted. Chest Wall:  No deformities, masses, or tenderness noted. Lungs:  Normal respiratory effort, chest expands symmetrically. Lungs are clear to auscultation, no crackles or wheezes. Heart:  Normal rate and regular rhythm. S1 and S2 normal without gallop, murmur, click, rub or other extra sounds. Abdomen:  abdomen soft and non-tender without masses, organomegaly or hernias noted. Msk:  No deformity or scoliosis noted of thoracic or lumbar spine.   Extremities:  No clubbing, cyanosis, edema noted with normal full range of motion of all joints.  Surgical correction of hand trauma with finger for opposable thumb on right hand.   Impression & Recommendations:  Problem # 1:  ACUTE PHARYNGITIS (ICD-462) Assessment Improved Resolved.  Problem # 2:  HEALTH MAINTENANCE EXAM (ICD-V70.0) Assessment: Unchanged Form signed. PPD will be placed next week and checked (today is Thu and can't be done today).  Complete Medication List: 1)  Prevacid 30 Mg Cpdr (Lansoprazole) .... Take one by mouth  daily 2)  Estradiol 2 Mg Tabs (Estradiol) .... Take 1/2  tablet by mouth once a day 3)  Zyrtec 10 Mg Tabs (Cetirizine hcl) .... One tab by mouth once daily 4)  Metformin Hcl 500 Mg Tabs (Metformin hcl) .... One tab by mouth at am and pm. 5)  Zantac 150 Maximum Strength 150 Mg Tabs (Ranitidine hcl) .Marland Kitchen.. 1 tablet at bedtime 6)  Buspar 15 Mg Tabs (Buspirone hcl) .... 1/2 tab by mouth  two times a day,  take consistently with regard to food. 7)  Freestyle Test Strp (Glucose blood) .... Check sugar daily and if needed for  for symptoms of elevated sugar are low sugar icd9 code 250.00 8)  Freestyle Lancets Misc (Lancets) .... Use as directed two times a day 9)  Vytorin 10-80 Mg Tabs (Ezetimibe-simvastatin) .... One tab by mouth at night 10)  Vitamin D 1000 Unit Tabs  (Cholecalciferol) .Marland Kitchen.. 1 by mouth two times a day  Patient Instructions: 1)  RTC 48 hrs after PPD placement for reading and form.   Orders Added: 1)  Est. Patient Level III [16109]    Current Allergies (reviewed today): ! OMEPRAZOLE (OMEPRAZOLE) DEMEROL (MEPERIDINE HCL) * TRICOR (FENOFIBRATE MICRONIZED) ZITHROMAX (AZITHROMYCIN)

## 2010-10-06 NOTE — Consult Note (Signed)
Summary: Dr.Howard Miller,Orthopaedic,Note  Dr.Howard Miller,Orthopaedic,Note   Imported By: Beau Fanny 08/25/2010 10:44:16  _____________________________________________________________________  External Attachment:    Type:   Image     Comment:   External Document

## 2010-12-13 ENCOUNTER — Other Ambulatory Visit: Payer: Self-pay | Admitting: Family Medicine

## 2011-01-12 ENCOUNTER — Other Ambulatory Visit: Payer: Self-pay | Admitting: Family Medicine

## 2011-01-12 NOTE — Telephone Encounter (Signed)
Rx called to pharmacy

## 2011-01-21 ENCOUNTER — Encounter: Payer: Self-pay | Admitting: Family Medicine

## 2011-01-24 ENCOUNTER — Other Ambulatory Visit: Payer: Self-pay | Admitting: Family Medicine

## 2011-01-25 ENCOUNTER — Other Ambulatory Visit (INDEPENDENT_AMBULATORY_CARE_PROVIDER_SITE_OTHER): Payer: BC Managed Care – PPO | Admitting: Family Medicine

## 2011-01-25 DIAGNOSIS — E119 Type 2 diabetes mellitus without complications: Secondary | ICD-10-CM

## 2011-01-25 LAB — HEMOGLOBIN A1C: Hgb A1c MFr Bld: 7.1 % — ABNORMAL HIGH (ref 4.6–6.5)

## 2011-02-01 ENCOUNTER — Ambulatory Visit (INDEPENDENT_AMBULATORY_CARE_PROVIDER_SITE_OTHER): Payer: BC Managed Care – PPO | Admitting: Family Medicine

## 2011-02-01 ENCOUNTER — Encounter: Payer: Self-pay | Admitting: Family Medicine

## 2011-02-01 VITALS — BP 112/72 | HR 84 | Temp 97.0°F | Ht 64.5 in | Wt 173.5 lb

## 2011-02-01 DIAGNOSIS — B369 Superficial mycosis, unspecified: Secondary | ICD-10-CM

## 2011-02-01 DIAGNOSIS — M7710 Lateral epicondylitis, unspecified elbow: Secondary | ICD-10-CM

## 2011-02-01 DIAGNOSIS — E119 Type 2 diabetes mellitus without complications: Secondary | ICD-10-CM

## 2011-02-01 MED ORDER — NYSTATIN 100000 UNIT/GM EX CREA: TOPICAL_CREAM | Freq: Two times a day (BID) | CUTANEOUS | Status: AC

## 2011-02-01 NOTE — Patient Instructions (Signed)
RTC 6 mos Comp Exam, labs prior.

## 2011-02-01 NOTE — Assessment & Plan Note (Signed)
Outbreak in left inguinal crease. Sarah Velasquez been using cortisone cream.  Will have her use Nystatin exclusively for two days, then add cortisone. Treat minimum of one week.

## 2011-02-01 NOTE — Progress Notes (Signed)
  Subjective:    Patient ID: Sarah Velasquez, female    DOB: Jul 31, 1946, 65 y.o.   MRN: [id number]  HPI Pt here for 6 month followup. She was seen by Dr Hyacinth Meeker for lat epicond, got injection and then a second. She has to do lots of carrying. [place] sent someone out to the house and the husband refused help. She is really under lots of stress  with her husband's dementia. She walks with her neighbor which she couldn't do much lately as her walking partner has been gone. She has no acute complaints.  .    Review of Systems  Constitutional: Negative for fever, chills, diaphoresis, activity change and fatigue.  HENT: Negative for ear pain, congestion, rhinorrhea and postnasal drip.   Eyes: Negative for redness.  Respiratory: Negative for cough, chest tightness, shortness of breath and wheezing.   Cardiovascular: Negative for chest pain.  Musculoskeletal:       Still some elbow discomfort but slightly better after two injections.       Objective:   Physical Exam  Constitutional: She appears well-developed and well-nourished. No distress.  HENT:  Head: Normocephalic and atraumatic.  Right Ear: External ear normal.  Left Ear: External ear normal.  Nose: Nose normal.  Mouth/Throat: Oropharynx is clear and moist. No oropharyngeal exudate.  Eyes: Conjunctivae and EOM are normal. Pupils are equal, round, and reactive to light.  Neck: Normal range of motion. Neck supple. No thyromegaly present.  Cardiovascular: Normal rate, regular rhythm and normal heart sounds.   Pulmonary/Chest: Effort normal and breath sounds normal. She has no wheezes. She has no rales.  Lymphadenopathy:    She has no cervical adenopathy.  Skin: She is not diaphoretic.  Psychiatric: She has a normal mood and affect. Her behavior is normal.       Tearful at times discussing husband's situation.          Assessment & Plan:

## 2011-02-01 NOTE — Assessment & Plan Note (Signed)
Slightly improved. Encouraged to wear strap.

## 2011-02-01 NOTE — Assessment & Plan Note (Addendum)
A1C unchanged. Acceptable with new guidelines. Discussed continued vigilance. A1C 7.1.

## 2011-04-28 ENCOUNTER — Telehealth: Payer: Self-pay | Admitting: Family Medicine

## 2011-05-25 ENCOUNTER — Ambulatory Visit (INDEPENDENT_AMBULATORY_CARE_PROVIDER_SITE_OTHER): Payer: Medicare Other | Admitting: Family Medicine

## 2011-05-25 ENCOUNTER — Encounter: Payer: Self-pay | Admitting: Family Medicine

## 2011-05-25 VITALS — BP 134/74 | HR 77 | Temp 97.6°F | Wt 169.4 lb

## 2011-05-25 DIAGNOSIS — K219 Gastro-esophageal reflux disease without esophagitis: Secondary | ICD-10-CM

## 2011-05-25 DIAGNOSIS — E119 Type 2 diabetes mellitus without complications: Secondary | ICD-10-CM

## 2011-05-25 DIAGNOSIS — R42 Dizziness and giddiness: Secondary | ICD-10-CM

## 2011-05-25 MED ORDER — MECLIZINE HCL 25 MG PO TABS: 25.0000 mg | ORAL_TABLET | Freq: Three times a day (TID) | ORAL | Status: DC | PRN

## 2011-05-25 NOTE — Assessment & Plan Note (Signed)
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 Work hard on better sugar control. Wear wide-toed shoes to avoid pressure. Inspect carefully daily to avoid blisters or bruises that could cause infection.

## 2011-05-25 NOTE — Patient Instructions (Addendum)
Take Antivert as needed an as discussed. GERD can be treated with medication but also with lifestyle changes including avoidance of late meals, excess alcohol, smoking cessation, avoiding spicy foods, chocolate, peppermint, colas, red wine and  acidic juices such as orange or tomato juice. Do not take mint or menthol products, use sugarless candy instead (Jolly Ranchers) Try Prilosec 45 mins before brfst and supper.  Inspect feet regularly to avoid infection.

## 2011-05-25 NOTE — Assessment & Plan Note (Addendum)
Appears to be vertigo. May take Antivert every six hours as needed.  Would start with taking in the middle of the night regularly when typically gets up, in order to have on board in the AM.  If then becomes dizzy later in the day, repeat dose, but only if needed. Keep fluid intake up.

## 2011-05-25 NOTE — Assessment & Plan Note (Signed)
See instructions for GERD hygiene. May try OTC Prilosec bid as directed 45 mins before brfst and supper.

## 2011-05-25 NOTE — Progress Notes (Signed)
  Subjective:    Patient ID: Sarah Velasquez, female    DOB: 1946/02/25, 65 y.o.   MRN: [id number]  HPI Pt here for acute appt for dizziness. She has awakened the last two days with dizziness. She has h/o of veritigo. She now has numbness in the left little toe. She is diabetic. She also is having chest discomfort from GERD. She takes 30mg  Prevacid in the AM and 150mg  Zantac in the evening. She typically gets sxs with empty stomach.     Review of SystemsNoncontributory except as above.       Objective:   Physical Exam  Constitutional: She is oriented to person, place, and time. She appears well-developed and well-nourished. No distress.  HENT:  Head: Normocephalic and atraumatic.  Right Ear: External ear normal.  Left Ear: External ear normal.  Nose: Nose normal.  Mouth/Throat: Oropharynx is clear and moist. No oropharyngeal exudate.  Eyes: Conjunctivae and EOM are normal. Pupils are equal, round, and reactive to light.  Neck: Normal range of motion. Neck supple. No thyromegaly present.  Cardiovascular: Normal rate, regular rhythm and normal heart sounds.   Pulmonary/Chest: Effort normal and breath sounds normal. She has no wheezes. She has no rales.  Lymphadenopathy:    She has no cervical adenopathy.  Neurological: She is alert and oriented to person, place, and time. She has normal reflexes. She displays normal reflexes. No cranial nerve deficit. She exhibits normal muscle tone. Coordination normal.  Skin: She is not diaphoretic.          Assessment & Plan:

## 2011-07-18 ENCOUNTER — Encounter: Payer: Self-pay | Admitting: Family Medicine

## 2011-07-18 ENCOUNTER — Ambulatory Visit (INDEPENDENT_AMBULATORY_CARE_PROVIDER_SITE_OTHER): Payer: Medicare Other | Admitting: Family Medicine

## 2011-07-18 VITALS — BP 120/72 | HR 81 | Temp 97.9°F | Ht 64.5 in | Wt 167.8 lb

## 2011-07-18 DIAGNOSIS — J029 Acute pharyngitis, unspecified: Secondary | ICD-10-CM

## 2011-07-18 DIAGNOSIS — J040 Acute laryngitis: Secondary | ICD-10-CM

## 2011-07-18 MED ORDER — HYDROCODONE-HOMATROPINE 5-1.5 MG/5ML PO SYRP: ORAL_SOLUTION | ORAL | Status: AC

## 2011-07-18 NOTE — Progress Notes (Signed)
  Subjective:    Patient ID: Sarah Velasquez, female    DOB: [birth year], 65 y.o.   MRN: [id number]  HPI  Sarah Velasquez, a 65 y.o. female presents today in the office for the following:    Patient of Dr. Hetty Ely. Has been halfway sick since Thursday. Lost her voice yesterday. Coughing up brownish green stuff. Around aa little child sick last week. Runny nose. Sneezing some.   Patent presents with runny nose, sneezing, cough, sore throat, malaise and minimal / low-grade fever . And lost voice for 4-5 days  + recent exposure to others with similar symptoms.   The patent denies sore throat as the primary complaint. Denies sthortness of breath/wheezing, high fever, chest pain, rhinits for more than 14 days, significant myalgia, otalgia, facial pain, abdominal pain, changes in bowel or bladder.  PMH, PHS, Allergies, Problem List, Medications, Family History, and Social History have all been reviewed.  ROS: as above, eating and drinking - tolerating PO. Urinating normally. No excessive vomitting or diarrhea. O/w as above.  PHYSICAL EXAM  Blood pressure 120/72, pulse 81, temperature 97.9 F (36.6 C), temperature source Oral, height 5' 4.5" (1.638 m), weight 167 lb 12.8 oz (76.114 kg), SpO2 99.00%.  PE: GEN: WDWN, Non-toxic, Atraumatic, normocephalic. A and O x 3. HEENT: Oropharynx clear without exudate, MMM, no significant LAD, mild rhinnorhea Ears: TM clear, COL visualized with good landmarks CV: RRR, no m/g/r. Pulm: CTA B, no wheezes, rhonchi, or crackles, normal respiratory effort. EXT: no c/c/e Psych: well oriented, neither depressed nor anxious in appearance  A/P: 1. Viral laryngitis and associated URI. Reassured. Supportive care. Can also use Hycodan at night when necessary    Review of Systems     Objective:   Physical Exam        Assessment & Plan:

## 2011-07-18 NOTE — Patient Instructions (Signed)
Laryngitis At the top of your windpipe is your voice box. It is the source of your voice. Inside your voice box are 2 bands of muscles called vocal cords. When you breathe, your vocal cords are relaxed and open so that air can get into the lungs. When you decide to say something, these cords come together and vibrate. The sound from these vibrations goes into your throat and comes out through your mouth as sound. Laryngitis is an inflammation of the vocal cords that causes hoarseness, cough, loss of voice, sore throat, and dry throat. Laryngitis can often be related to a viral infection, excessive smoking, excessive talking or yelling, inhalation of toxic fumes, allergies, and reflux of acid from your stomach. CAUSES Laryngitis can be temporary (acute) or long-term (chronic). Most cases of acute laryngitis improve with time. The typical cause of acute laryngitis is viral infection, vocal strain, measles or mumps, or bacterial infections. Chronic laryngitis lasts for more than 3 weeks. It is usually caused by vocal cord strain, vocal cord injury, postnasal drip, growths on the vocal cords, or acid reflux. RISK FACTORS  Respiratory infections.   Exposure to irritating substances, such as cigarette smoke, excessive amounts of alcohol, stomach acids, and workplace chemicals.   Voice trauma, such as vocal cord injury from shouting or speaking too loud.  DIAGNOSIS  The most common sign of laryngitis is hoarseness. This can range from partial to total loss of your voice. Laryngoscopy may be necessary. This allows your caregiver to look into the larynx in order to diagnose your condition. HOME CARE INSTRUCTIONS  Drink enough fluids to keep your urine clear or pale yellow.   Rest until you no longer have symptoms or as directed by your caregiver.   Breathe in moist air.   Take all medicine as directed by your caregiver.   Do not smoke.   Talk as little as possible (this includes whispering).    Write on paper instead of talking until your voice is back to normal.   Follow up with your caregiver if your condition has not improved after 10 days.  SEEK MEDICAL CARE IF:   You have trouble breathing.   You cough up blood.   You have persistent fever.   You have increasing pain.   You have difficulty swallowing.  Document Released: 08/21/2005 Document Revised: 05/03/2011 Document Reviewed: 10/27/2010 [employer] Patient Information 2012 [employer]. 

## 2011-07-25 ENCOUNTER — Other Ambulatory Visit: Payer: Self-pay | Admitting: Family Medicine

## 2011-07-25 DIAGNOSIS — E782 Mixed hyperlipidemia: Secondary | ICD-10-CM

## 2011-07-25 DIAGNOSIS — E119 Type 2 diabetes mellitus without complications: Secondary | ICD-10-CM

## 2011-07-25 DIAGNOSIS — E559 Vitamin D deficiency, unspecified: Secondary | ICD-10-CM

## 2011-07-26 ENCOUNTER — Other Ambulatory Visit (INDEPENDENT_AMBULATORY_CARE_PROVIDER_SITE_OTHER): Payer: Medicare Other

## 2011-07-26 DIAGNOSIS — E559 Vitamin D deficiency, unspecified: Secondary | ICD-10-CM

## 2011-07-26 DIAGNOSIS — E119 Type 2 diabetes mellitus without complications: Secondary | ICD-10-CM

## 2011-07-26 DIAGNOSIS — E782 Mixed hyperlipidemia: Secondary | ICD-10-CM

## 2011-07-26 LAB — CBC WITH DIFFERENTIAL/PLATELET
Basophils Absolute: 0 10*3/uL (ref 0.0–0.1)
Eosinophils Absolute: 0.2 10*3/uL (ref 0.0–0.7)
Lymphocytes Relative: 30 % (ref 12.0–46.0)
MCHC: 33.4 g/dL (ref 30.0–36.0)
MCV: 90.3 fl (ref 78.0–100.0)
Monocytes Absolute: 0.5 10*3/uL (ref 0.1–1.0)
Neutro Abs: 5.1 10*3/uL (ref 1.4–7.7)
Neutrophils Relative %: 61.8 % (ref 43.0–77.0)
RDW: 13.7 % (ref 11.5–14.6)

## 2011-07-26 LAB — LDL CHOLESTEROL, DIRECT: Direct LDL: 96.2 mg/dL

## 2011-07-26 LAB — MICROALBUMIN / CREATININE URINE RATIO
Creatinine,U: 116 mg/dL
Microalb, Ur: 0.5 mg/dL (ref 0.0–1.9)

## 2011-07-26 LAB — HEPATIC FUNCTION PANEL
ALT: 21 U/L (ref 0–35)
AST: 28 U/L (ref 0–37)
Alkaline Phosphatase: 70 U/L (ref 39–117)
Bilirubin, Direct: 0.1 mg/dL (ref 0.0–0.3)
Total Bilirubin: 0.7 mg/dL (ref 0.3–1.2)
Total Protein: 7.5 g/dL (ref 6.0–8.3)

## 2011-07-26 LAB — RENAL FUNCTION PANEL
Albumin: 4.1 g/dL (ref 3.5–5.2)
BUN: 12 mg/dL (ref 6–23)
Calcium: 9.3 mg/dL (ref 8.4–10.5)
Chloride: 105 mEq/L (ref 96–112)
Phosphorus: 3.9 mg/dL (ref 2.3–4.6)

## 2011-07-26 LAB — LIPID PANEL
Total CHOL/HDL Ratio: 3
Triglycerides: 210 mg/dL — ABNORMAL HIGH (ref 0.0–149.0)

## 2011-07-27 LAB — VITAMIN D 25 HYDROXY (VIT D DEFICIENCY, FRACTURES): Vit D, 25-Hydroxy: 66 ng/mL (ref 30–89)

## 2011-08-09 ENCOUNTER — Ambulatory Visit (INDEPENDENT_AMBULATORY_CARE_PROVIDER_SITE_OTHER): Payer: Medicare Other | Admitting: Family Medicine

## 2011-08-09 ENCOUNTER — Encounter: Payer: Self-pay | Admitting: Family Medicine

## 2011-08-09 DIAGNOSIS — N6019 Diffuse cystic mastopathy of unspecified breast: Secondary | ICD-10-CM

## 2011-08-09 DIAGNOSIS — E559 Vitamin D deficiency, unspecified: Secondary | ICD-10-CM

## 2011-08-09 DIAGNOSIS — E785 Hyperlipidemia, unspecified: Secondary | ICD-10-CM

## 2011-08-09 DIAGNOSIS — E119 Type 2 diabetes mellitus without complications: Secondary | ICD-10-CM

## 2011-08-09 DIAGNOSIS — E782 Mixed hyperlipidemia: Secondary | ICD-10-CM

## 2011-08-09 DIAGNOSIS — Z0000 Encounter for general adult medical examination without abnormal findings: Secondary | ICD-10-CM

## 2011-08-09 DIAGNOSIS — F411 Generalized anxiety disorder: Secondary | ICD-10-CM

## 2011-08-09 DIAGNOSIS — R42 Dizziness and giddiness: Secondary | ICD-10-CM

## 2011-08-09 DIAGNOSIS — K219 Gastro-esophageal reflux disease without esophagitis: Secondary | ICD-10-CM

## 2011-08-09 NOTE — Assessment & Plan Note (Signed)
Good nos except Trigs slightly high, watch sweets and carbs. Lab Results  Component Value Date   CHOL 161 07/26/2011   HDL 52.70 07/26/2011   LDLDIRECT 96.2 07/26/2011   TRIG 210.0* 07/26/2011   CHOLHDL 3 07/26/2011

## 2011-08-09 NOTE — Patient Instructions (Signed)
RTC with Dr Para March as needed.

## 2011-08-09 NOTE — Assessment & Plan Note (Signed)
Improved control, cont the good work.   Discussed diet and exercise. Lab Results  Component Value Date   HGBA1C 6.8* 07/26/2011  Down from 7.1.

## 2011-08-09 NOTE — Assessment & Plan Note (Signed)
Improved, essentially resolved.

## 2011-08-09 NOTE — Assessment & Plan Note (Signed)
Mammo ordered.

## 2011-08-09 NOTE — Progress Notes (Signed)
  Subjective:    Patient ID: Sarah Velasquez, female    DOB: 13-Mar-1946, 65 y.o.   MRN: [id number]  HPI Pt here for Comp Exam. She sees Dr Logan Bores for Gyn care, last seen in Apr.  She has  No complaints today and feels well.   Review of Systems  Constitutional: Negative for fever, chills, diaphoresis, activity change, appetite change, fatigue and unexpected weight change.  HENT: Negative for hearing loss, ear pain, nosebleeds, rhinorrhea, trouble swallowing, tinnitus and ear discharge.   Eyes: Negative for pain, discharge, redness and visual disturbance.  Respiratory: Negative for cough, chest tightness, shortness of breath and wheezing.   Cardiovascular: Negative for chest pain, palpitations and leg swelling.  Gastrointestinal: Negative for nausea, vomiting, abdominal pain, diarrhea, constipation, blood in stool and abdominal distention.  Genitourinary: Negative for dysuria and frequency.  Musculoskeletal: Negative for myalgias, back pain and arthralgias.  Skin: Negative for rash.  Neurological: Negative for dizziness, tremors, syncope and numbness.  Hematological: Negative for adenopathy. Does not bruise/bleed easily.  Psychiatric/Behavioral: Negative for hallucinations and agitation. The patient is not nervous/anxious.        Objective:   Physical Exam  Constitutional: She is oriented to person, place, and time. She appears well-developed and well-nourished. No distress.  HENT:  Head: Normocephalic and atraumatic.  Right Ear: External ear normal.  Left Ear: External ear normal.  Nose: Nose normal.  Mouth/Throat: Oropharynx is clear and moist.  Eyes: Conjunctivae and EOM are normal. Pupils are equal, round, and reactive to light. No scleral icterus.  Neck: Normal range of motion. Neck supple. No thyromegaly present.  Cardiovascular: Normal rate, regular rhythm, normal heart sounds and intact distal pulses.   No murmur heard. Pulmonary/Chest: Effort normal and breath sounds normal. No  respiratory distress. She has no wheezes.  Abdominal: Soft. Bowel sounds are normal. She exhibits no mass. There is no tenderness.  Genitourinary:       No breast/pelvic done   Musculoskeletal: Normal range of motion.       Back Nontender in the Midline  Lymphadenopathy:    She has no cervical adenopathy.  Neurological: She is alert and oriented to person, place, and time. She exhibits normal muscle tone. Coordination normal.  Skin: Skin is warm and dry. No rash noted. She is not diaphoretic.  Psychiatric: She has a normal mood and affect. Her behavior is normal. Judgment and thought content normal.          Assessment & Plan:

## 2011-08-09 NOTE — Assessment & Plan Note (Signed)
Stable with good control. Cont Buspar at current 10mg  dose.

## 2011-08-09 NOTE — Assessment & Plan Note (Signed)
Adequate, cont curr therapy.

## 2011-08-09 NOTE — Assessment & Plan Note (Signed)
Well controlled with weight loss and Prevacid use.

## 2011-08-16 NOTE — Progress Notes (Signed)
  Subjective:    Patient ID: Sarah Velasquez, female    DOB: 1946-05-30, 65 y.o.   MRN: [id number]  HPI    Review of Systems     Objective:   Physical Exam        Assessment & Plan:  HMPE  I have personally reviewed the Medicare Annual Wellness questionnaire and have noted 1. The patient's medical and social history 2. Their use of alcohol, tobacco or illicit drugs 3. Their current medications and supplements 4. The patient's functional ability including ADL's, fall risks, home safety risks and hearing or visual             impairment. 5. Diet and physical activities 6. Evidence for depression or mood disorders Better affect than previously. Dealing with her husband's dementia well at this point.

## 2011-09-03 ENCOUNTER — Other Ambulatory Visit: Payer: Self-pay | Admitting: Family Medicine

## 2011-09-05 DIAGNOSIS — M5416 Radiculopathy, lumbar region: Secondary | ICD-10-CM

## 2011-09-05 HISTORY — DX: Radiculopathy, lumbar region: M54.16

## 2011-09-05 HISTORY — PX: OTHER SURGICAL HISTORY: SHX169

## 2011-10-02 DIAGNOSIS — Z23 Encounter for immunization: Secondary | ICD-10-CM | POA: Diagnosis not present

## 2011-10-09 ENCOUNTER — Other Ambulatory Visit: Payer: Self-pay | Admitting: *Deleted

## 2011-10-09 ENCOUNTER — Other Ambulatory Visit: Payer: Self-pay | Admitting: Family Medicine

## 2011-10-09 MED ORDER — EZETIMIBE-SIMVASTATIN 10-80 MG PO TABS: 1.0000 | ORAL_TABLET | Freq: Every day | ORAL | Status: DC

## 2011-11-16 DIAGNOSIS — H16229 Keratoconjunctivitis sicca, not specified as Sjogren's, unspecified eye: Secondary | ICD-10-CM | POA: Diagnosis not present

## 2011-12-04 DIAGNOSIS — M8580 Other specified disorders of bone density and structure, unspecified site: Secondary | ICD-10-CM

## 2011-12-04 HISTORY — DX: Other specified disorders of bone density and structure, unspecified site: M85.80

## 2011-12-04 HISTORY — PX: OTHER SURGICAL HISTORY: SHX169

## 2011-12-04 LAB — HM PAP SMEAR

## 2011-12-20 DIAGNOSIS — Z124 Encounter for screening for malignant neoplasm of cervix: Secondary | ICD-10-CM | POA: Diagnosis not present

## 2011-12-20 DIAGNOSIS — Z0000 Encounter for general adult medical examination without abnormal findings: Secondary | ICD-10-CM | POA: Diagnosis not present

## 2011-12-27 DIAGNOSIS — E8941 Symptomatic postprocedural ovarian failure: Secondary | ICD-10-CM | POA: Diagnosis not present

## 2012-01-18 ENCOUNTER — Ambulatory Visit: Payer: Self-pay | Admitting: Obstetrics and Gynecology

## 2012-01-18 DIAGNOSIS — N6459 Other signs and symptoms in breast: Secondary | ICD-10-CM | POA: Diagnosis not present

## 2012-01-18 DIAGNOSIS — Z1231 Encounter for screening mammogram for malignant neoplasm of breast: Secondary | ICD-10-CM | POA: Diagnosis not present

## 2012-03-22 ENCOUNTER — Other Ambulatory Visit: Payer: Self-pay | Admitting: Family Medicine

## 2012-03-22 NOTE — Telephone Encounter (Signed)
Refilled per request.

## 2012-03-22 NOTE — Telephone Encounter (Signed)
Pt left v/m pt scheduled appt with Dr Reece Agar next week and is out of Buspar and request it sent in today to [facility].

## 2012-03-29 ENCOUNTER — Ambulatory Visit (INDEPENDENT_AMBULATORY_CARE_PROVIDER_SITE_OTHER): Payer: Medicare Other | Admitting: Family Medicine

## 2012-03-29 ENCOUNTER — Encounter: Payer: Self-pay | Admitting: Family Medicine

## 2012-03-29 VITALS — BP 120/80 | HR 76 | Temp 98.2°F | Wt 168.0 lb

## 2012-03-29 DIAGNOSIS — E782 Mixed hyperlipidemia: Secondary | ICD-10-CM

## 2012-03-29 DIAGNOSIS — E119 Type 2 diabetes mellitus without complications: Secondary | ICD-10-CM

## 2012-03-29 DIAGNOSIS — J069 Acute upper respiratory infection, unspecified: Secondary | ICD-10-CM | POA: Diagnosis not present

## 2012-03-29 LAB — COMPREHENSIVE METABOLIC PANEL
CO2: 28 mEq/L (ref 19–32)
Calcium: 9.5 mg/dL (ref 8.4–10.5)
Chloride: 104 mEq/L (ref 96–112)
Creatinine, Ser: 0.8 mg/dL (ref 0.4–1.2)
GFR: 79.73 mL/min (ref >60.00)
Glucose, Bld: 144 mg/dL — ABNORMAL HIGH (ref 70–99)
Total Bilirubin: 0.7 mg/dL (ref 0.3–1.2)

## 2012-03-29 LAB — LDL CHOLESTEROL, DIRECT: Direct LDL: 93.2 mg/dL

## 2012-03-29 LAB — MICROALBUMIN / CREATININE URINE RATIO
Creatinine,U: 86.6 mg/dL
Microalb Creat Ratio: 0.5 mg/g (ref 0.0–30.0)

## 2012-03-29 LAB — LIPID PANEL
Cholesterol: 163 mg/dL (ref 0–200)
Triglycerides: 235 mg/dL — ABNORMAL HIGH (ref 0.0–149.0)

## 2012-03-29 LAB — HEMOGLOBIN A1C: Hgb A1c MFr Bld: 6.9 % — ABNORMAL HIGH (ref 4.6–6.5)

## 2012-03-29 MED ORDER — GUAIFENESIN-CODEINE 100-10 MG/5ML PO SYRP: 5.0000 mL | ORAL_SOLUTION | Freq: Two times a day (BID) | ORAL | Status: AC | PRN

## 2012-03-29 NOTE — Assessment & Plan Note (Signed)
Anticipate viral. Supportive care discussed. Red flags to return discussed. Trial of cheratussin for cough.

## 2012-03-29 NOTE — Patient Instructions (Addendum)
Good to see you today. Blood work today. Keep an eye on sugars - think about returning to [facility]. Return in 6 months for medicare wellness visit, prior fasting for blood work.

## 2012-03-29 NOTE — Progress Notes (Signed)
  Subjective:    Patient ID: Sarah Velasquez, female    DOB: 02-Aug-1946, 66 y.o.   MRN: [id number]  HPI CC: would like blood work today  Has horrible cold currently, going on for last week.  Deep cough.  Taking alka seltzer plus.  asks for cough syrup  Takes care of husband with FTD.  Therefore doesn't take care of herself as she should.  Stays stressed.  On buspar for this, wants to continue current dosing regimen.  New Medicare pt.  Had initial wellness exam 08/2011.  DM - dx about 8 yrs ago.  Doesn't check sugars regularly.  Vision screen - 11/2011, told normal.  Last foot exam thinks 08/2011.  No low sugars. Lab Results  Component Value Date   HGBA1C 6.8* 07/26/2011   HLD - on vytorin. No myalgias.  No h/o HTN.  Past Medical History  Diagnosis Date  . Diabetes mellitus     typeII  . Hyperlipidemia 05/1995  . Diverticulosis of colon 09/2003    colonoscopy ext.and internal hemorrhoids (Dr. Mechele Collin )12/03/2006// colonoscopy polyps multiple  benign; interal hemorrhoids ,divertics 09/16/2003  . GERD (gastroesophageal reflux disease)     EGD hiatal hernia; esophagitis/sigmoidoscopy WNL (Dr. Mechele Collin) 03/04/1996: ABD. U/S  WNL (06/1996)//EGD multiple polyps ;small H.H (09/16/2003)  . Chest pain 02/15-16/2009    Hospital [facility]  chest pain rule out //stress Myoview negative 10/21/2007//Stress cardiolite ;apical thinning ,negative ischemia (09/14/2003)     Review of Systems Per HPI    Objective:   Physical Exam  Nursing note and vitals reviewed. Constitutional: She appears well-developed and well-nourished. No distress.  HENT:  Head: Normocephalic and atraumatic.  Right Ear: External ear normal.  Left Ear: External ear normal.  Nose: Nose normal.  Mouth/Throat: Oropharynx is clear and moist. No oropharyngeal exudate.  Eyes: Conjunctivae and EOM are normal. Pupils are equal, round, and reactive to light. No scleral icterus.  Neck: Normal range of motion. Neck supple. Carotid bruit is  not present.  Cardiovascular: Normal rate, regular rhythm, normal heart sounds and intact distal pulses.   No murmur heard. Pulmonary/Chest: Effort normal and breath sounds normal. No respiratory distress. She has no wheezes. She has no rales.  Musculoskeletal: She exhibits no edema.       Diabetic foot exam: Normal inspection No skin breakdown No calluses  Normal DP/PT pulses Normal sensation to light tough and monofilament Nails normal  Lymphadenopathy:    She has no cervical adenopathy.  Skin: Skin is warm and dry. No rash noted.  Psychiatric: She has a normal mood and affect.       Assessment & Plan:

## 2012-03-29 NOTE — Assessment & Plan Note (Signed)
Chronic, stable. H/o good control. Recheck blood work today. Recommended restart going to gym.

## 2012-03-29 NOTE — Assessment & Plan Note (Signed)
Check FLP. If trig staying elevated, may rec start fish oil (per pt h/o fish allergy)

## 2012-04-02 ENCOUNTER — Encounter: Payer: Self-pay | Admitting: Family Medicine

## 2012-05-04 ENCOUNTER — Other Ambulatory Visit: Payer: Self-pay | Admitting: Family Medicine

## 2012-05-18 DIAGNOSIS — Z23 Encounter for immunization: Secondary | ICD-10-CM | POA: Diagnosis not present

## 2012-05-28 ENCOUNTER — Encounter: Payer: Self-pay | Admitting: Family Medicine

## 2012-05-28 ENCOUNTER — Ambulatory Visit (INDEPENDENT_AMBULATORY_CARE_PROVIDER_SITE_OTHER): Payer: Medicare Other | Admitting: Family Medicine

## 2012-05-28 VITALS — BP 154/80 | HR 84 | Temp 98.1°F | Wt 167.2 lb

## 2012-05-28 DIAGNOSIS — M25579 Pain in unspecified ankle and joints of unspecified foot: Secondary | ICD-10-CM | POA: Diagnosis not present

## 2012-05-28 DIAGNOSIS — M25569 Pain in unspecified knee: Secondary | ICD-10-CM

## 2012-05-28 DIAGNOSIS — M25559 Pain in unspecified hip: Secondary | ICD-10-CM

## 2012-05-28 DIAGNOSIS — M25552 Pain in left hip: Secondary | ICD-10-CM

## 2012-05-28 MED ORDER — CYCLOBENZAPRINE HCL 5 MG PO TABS: 5.0000 mg | ORAL_TABLET | Freq: Two times a day (BID) | ORAL | Status: DC | PRN

## 2012-05-28 MED ORDER — NAPROXEN 500 MG PO TABS: ORAL_TABLET | ORAL | Status: DC

## 2012-05-28 NOTE — Patient Instructions (Addendum)
Keep track of blood pressure at home (store or pharmacy) - if consistently >135/85, please let me know. I think you have left greater trochanteric bursitis - or bursa inflammation on left hip - as well as early arthritis of knees and possibly ankle.   Treat with naprosyn twice daily with food and flexeril muscle relaxant at night (may make you sleepy).  May also use ice/heat. If not better or any worsening, please return to see me. If lateral hip pain not improved with meds and stretching exercises, call us for referral to Dr. Patsy Lager. Good to see you today, call us with questions.

## 2012-05-28 NOTE — Progress Notes (Signed)
  Subjective:    Patient ID: Sarah Velasquez, female    DOB: 1946/04/28, 66 y.o.   MRN: [id number]  HPI CC: joint pains  Pleasant 66yo with h/o DM, HLD, GERD, and osteopenia presents with arthralgias.  BP elevated today.  No h/o HTN.  Under increased stress and in pain, attributes to these.  Several month h/o bilateral asymmetrical arthralgias.  R ankle, bilateral knees and bilateral hips, especially L lateral hip.  No neck pain, shoulder pain, elbows or wrists or hands.  No pain at left ankle.  Denies numbness.  Denies swelling, warmth or redness of joints.  Denies swollen digits.  No new rashes, abd pain, n/v, oral lesions, eye problems.  So far hasn't tried anything for pain.  Worried about taking NSAIDs because of interaction with aspirin.  2 yrs ago fell off step ladder and right hit bottom.  Since then very tender.  Wt Readings from Last 3 Encounters:  05/28/12 167 lb 4 oz (75.864 kg)  03/29/12 168 lb (76.204 kg)  08/09/11 170 lb 12 oz (77.452 kg)  lost 8-10 lbs in last year.  Lives with husband - takes care of him (h/o FTD).  Pt has difficult time with this.  Past Medical History  Diagnosis Date  . Diabetes mellitus     typeII  . Hyperlipidemia 05/1995  . Diverticulosis of colon 09/2003    colonoscopy ext.and internal hemorrhoids (Dr. Mechele Collin )12/03/2006// colonoscopy polyps multiple  benign; interal hemorrhoids ,divertics 09/16/2003  . GERD (gastroesophageal reflux disease)     EGD hiatal hernia; esophagitis/sigmoidoscopy WNL (Dr. Mechele Collin) 03/04/1996: ABD. U/S  WNL (06/1996)//EGD multiple polyps ;small H.H (09/16/2003)  . Chest pain 02/15-16/2009    Hospital [facility]  chest pain rule out //stress Myoview negative 10/21/2007//Stress cardiolite ;apical thinning ,negative ischemia (09/14/2003)  . Osteopenia 12/2011    spine -1.8, hip -0.7    Review of Systems Per HPI    Objective:   Physical Exam  Vitals reviewed. Constitutional: She appears well-developed and well-nourished. No  distress.  Musculoskeletal:       L ankle: WNL R ankle: no tenderness to palpation at ligaments or tendons.  + tender at lateral ankle with eversion R > L knee - crepitus, tender to palpation anterior knee joints, no obvious effusion present.  Neg drawer test, neg mcmurray's, no PF grind, no abnormal patellar movement, no pain with valgus/varus strain.  No erythema/warmth of joints. Neg SLR test.  No hip pain with int/ext rotation against resistance. + tender to palpation at greater trochanteric bursa on left as well as SIJ on left.  Skin: Skin is warm and dry. No rash noted.       Assessment & Plan:

## 2012-05-29 ENCOUNTER — Encounter: Payer: Self-pay | Admitting: Family Medicine

## 2012-05-29 DIAGNOSIS — M25552 Pain in left hip: Secondary | ICD-10-CM | POA: Insufficient documentation

## 2012-05-29 DIAGNOSIS — M25579 Pain in unspecified ankle and joints of unspecified foot: Secondary | ICD-10-CM | POA: Insufficient documentation

## 2012-05-29 DIAGNOSIS — M25569 Pain in unspecified knee: Secondary | ICD-10-CM | POA: Insufficient documentation

## 2012-05-29 NOTE — Assessment & Plan Note (Signed)
Anticipate L greater trochanteric bursitis.  Treat with naprosyn and provided with exercises from [facility] pt advisor. Update if not improving as expected for referral to [facility] to consider bursa injection.

## 2012-05-29 NOTE — Assessment & Plan Note (Signed)
Possibly early arthritis.  Remote h/o fall.  Treat with NSAIDs, consider imaging if not improved.

## 2012-05-29 NOTE — Assessment & Plan Note (Signed)
Anticipate osteoarthritis presentation.  Treat with naprosyn, if not better, check xrays. Discussed short term course of NSAIDs ok with aspirin, but if needing long term may change med. Pt agrees with plan.

## 2012-06-11 ENCOUNTER — Ambulatory Visit (INDEPENDENT_AMBULATORY_CARE_PROVIDER_SITE_OTHER): Payer: Medicare Other | Admitting: Family Medicine

## 2012-06-11 ENCOUNTER — Ambulatory Visit (INDEPENDENT_AMBULATORY_CARE_PROVIDER_SITE_OTHER)
Admission: RE | Admit: 2012-06-11 | Discharge: 2012-06-11 | Disposition: A | Discharge status: Home / Self Care | Payer: Medicare Other | Source: Ambulatory Visit | Attending: Family Medicine | Admitting: Family Medicine

## 2012-06-11 ENCOUNTER — Encounter: Payer: Self-pay | Admitting: Family Medicine

## 2012-06-11 VITALS — BP 128/80 | HR 79 | Temp 97.7°F | Wt 169.0 lb

## 2012-06-11 DIAGNOSIS — M25551 Pain in right hip: Secondary | ICD-10-CM

## 2012-06-11 DIAGNOSIS — M25559 Pain in unspecified hip: Secondary | ICD-10-CM

## 2012-06-11 DIAGNOSIS — M5137 Other intervertebral disc degeneration, lumbosacral region: Secondary | ICD-10-CM | POA: Diagnosis not present

## 2012-06-11 DIAGNOSIS — M1710 Unilateral primary osteoarthritis, unspecified knee: Secondary | ICD-10-CM | POA: Diagnosis not present

## 2012-06-11 MED ORDER — TRAMADOL HCL 50 MG PO TABS: 50.0000 mg | ORAL_TABLET | Freq: Two times a day (BID) | ORAL | Status: DC | PRN

## 2012-06-11 NOTE — Patient Instructions (Signed)
XRays today - we will call you with radiology report if change in plan based on their read. I do think you have continued sacroiliitis and greater trochanteric bursitis. I'd like you to set up appointment with Dr. Patsy Lager for consideration of steroid injection into lateral hips. Continue naprosyn 500mg  twice daily as needed (take with food). Take tramadol for breakthrough pain or at night time.

## 2012-06-11 NOTE — Assessment & Plan Note (Addendum)
2 wks ago with worse L hip pain, now worsened R hip pain - mostly consistent with greater trochanteric bursitis on right as well as sacroiliitis.  Treat with restarting naprosyn (without other NSAIDs) and tramadol for breakthrough pain. Given longstanding R sided upper leg pain and buttock pain, checked xrays today - no significant hip DJD, ?L4/L5 vertebral body narrowing.  Await radiology read. Recommended schedule appt with Dr. Patsy Lager for evaluation of GTB and possible steroid injection.

## 2012-06-11 NOTE — Progress Notes (Signed)
  Subjective:    Patient ID: Sarah Velasquez, female    DOB: July 05, 1946, 66 y.o.   MRN: [id number]  HPI CC: hip pain  See prior note for details.  Continued and worsening R sided pain last few weeks.  Describes R lower back, R buttock pain, lateral hip pain, and inner hip pain with some radiation down anterior thigh.  Pain improved with hot shower and with heating pad.  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  Left lat hip pain has improved. Took naprosyn 500mg  bid for 1 wk.  Also taking acetaminophen and aleve OTC.    Continues working part time at daycare.  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  Denies fevers/chills, numbness or weakness of legs.  Had fall 2 yrs ago - fell off step ladder and hit right lower back and buttock. Since then that side has been intermittently tender.    Past Medical History  Diagnosis Date  . Diabetes mellitus     typeII  . Hyperlipidemia 05/1995  . Diverticulosis of colon 09/2003    colonoscopy ext.and internal hemorrhoids (Dr. Mechele Collin )12/03/2006// colonoscopy polyps multiple  benign; interal hemorrhoids ,divertics 09/16/2003  . GERD (gastroesophageal reflux disease)     EGD hiatal hernia; esophagitis/sigmoidoscopy WNL (Dr. Mechele Collin) 03/04/1996: ABD. U/S  WNL (06/1996)//EGD multiple polyps ;small H.H (09/16/2003)  . Chest pain 02/15-16/2009    Hospital [facility]  chest pain rule out //stress Myoview negative 10/21/2007//Stress cardiolite ;apical thinning ,negative ischemia (09/14/2003)  . Osteopenia 12/2011    spine -1.8, hip -0.7  . Hand deformity, congenital     R    Review of Systems Per HPI    Objective:   Physical Exam  Nursing note and vitals reviewed. Constitutional: She appears well-developed and well-nourished. No distress.  Musculoskeletal: She exhibits no edema.       No midline spine tenderness. + R SIJ and GTB pain, L GTB pain. No sciatic notch  pain. Neg SLR bilaterally, neg FABER test. No pain with int/ext rotation at hip  Neurological: She has normal strength. No sensory deficit. She exhibits normal muscle tone. She displays a negative Romberg sign.  Reflex Scores:      Patellar reflexes are 2+ on the right side and 2+ on the left side.      Achilles reflexes are 2+ on the right side and 2+ on the left side.      Able to heel and toe walk  Skin: Skin is warm and dry. No rash noted.       Assessment & Plan:

## 2012-06-26 ENCOUNTER — Encounter: Payer: Self-pay | Admitting: Family Medicine

## 2012-06-26 ENCOUNTER — Ambulatory Visit (INDEPENDENT_AMBULATORY_CARE_PROVIDER_SITE_OTHER): Payer: Medicare Other | Admitting: Family Medicine

## 2012-06-26 VITALS — BP 118/68 | HR 84 | Temp 97.8°F | Wt 170.0 lb

## 2012-06-26 DIAGNOSIS — M5137 Other intervertebral disc degeneration, lumbosacral region: Secondary | ICD-10-CM

## 2012-06-26 DIAGNOSIS — M5136 Other intervertebral disc degeneration, lumbar region: Secondary | ICD-10-CM

## 2012-06-26 DIAGNOSIS — M7060 Trochanteric bursitis, unspecified hip: Secondary | ICD-10-CM

## 2012-06-26 DIAGNOSIS — M76899 Other specified enthesopathies of unspecified lower limb, excluding foot: Secondary | ICD-10-CM

## 2012-06-26 DIAGNOSIS — M461 Sacroiliitis, not elsewhere classified: Secondary | ICD-10-CM | POA: Diagnosis not present

## 2012-06-26 NOTE — Progress Notes (Signed)
Nature conservation officer at [facility] [address] [place] [address] Phone: [phone] Fax: [phone]  Date:  06/26/2012   Name:  Sarah Velasquez   DOB:  [birth year]   MRN:  [id number] Gender: female Age: 66 y.o.  PCP:  Eustaquio Boyden, MD  Evaluating MD: Hannah Beat, MD   Chief Complaint: Hip Pain   History of Present Illness:  Sarah Velasquez is a 66 y.o. pleasant patient who presents with the following:  Much worse in the morning and at night, goes all the way down to her knee. Also has a place on her sides, and also with some lateral side pain.   [name] wasn't patient lives known for many years who presents with right-sided lateral hip pain more than the LEFT. She is intermittently had pain both on the RIGHT and LEFT side. Times previously the LEFT side was significantly worse compared to the RIGHT. At this time, it is mostly her RIGHT side. She is having some discomfort even with lying on her side at nighttime. She is stressed him from taking care of her husband with some frontotemporal dementia.  She denies any groin pain. She also has some posterior buttocks pain on the RIGHT side and some pain that goes down to the knee.  Dg Lumbar Spine Complete  06/11/2012  *RADIOLOGY REPORT*  Clinical Data: Low back pain.  History of hip pain.  History of injury from fall 2 years previously.  LUMBAR SPINE - COMPLETE 4+ VIEW  Comparison: None.  Findings: SI joints appear intact.  There are five non-rib bearing lumbar-type vertebral bodies which are labeled L1-L5.  There is narrowing of intervertebral disc spaces at multiple levels.  It is most severe at the levels of L4-L5 and L5-S1.  Marginal osteophyte formation is present at multiple levels representing degenerative spondylosis.  No fracture or dislocation is evident.  No pars defects are seen.  There is some apophyseal joint degenerative spondylosis.  IMPRESSION: Changes of degenerative disc disease and degenerative spondylosis. No  fracture is evident.   Original Report Authenticated By: Crawford Givens, M.D.    Dg Hip Complete Right  06/11/2012  *RADIOLOGY REPORT*  Clinical Data: History of right hip pain.  RIGHT HIP - COMPLETE 2+ VIEW  Comparison: None.  Findings: There is degenerative spondylosis.  SI joints appear intact.  Hip joint spaces are preserved.  Alignment is normal.  No fracture, bony destruction, or calcific bursitis is evident.  There is osteopenic appearance of bones.  IMPRESSION: There is slightly osteopenic appearance of the bones.  No hip lesion is identified.  There is degenerative spondylosis.   Original Report Authenticated By: Crawford Givens, M.D.     Films were independently reviewed by myself in the office and with the patient and discussed with her. RIGHT hip x-ray: No significant degenerative changes were noted and joint spaces were relatively maintained.  Lumbar spine films: Most significant for significant degenerative disc disease, most seen around L4-L5, as well as L5-S1. There is even some thoracic degenerative disc disease higher up on the spine films.  Patient Active Problem List  Diagnosis  . DIABETES MELLITUS, TYPE II  . UNSPECIFIED VITAMIN D DEFICIENCY  . Mixed hyperlipidemia  . ANXIETY DISORDER, GENERALIZED  . ALLERGIC RHINITIS  . GERD  . FIBROCYSTIC BREAST DISEASE  . DISORDER, MENOPAUSAL NEC  . LATERAL EPICONDYLITIS, LEFT  . Fungal dermatitis  . Vertigo  . Osteopenia  . Left hip pain  . Knee pain  . Ankle pain  .  Right hip pain    Past Medical History  Diagnosis Date  . Diabetes mellitus     typeII  . Hyperlipidemia 05/1995  . Diverticulosis of colon 09/2003    colonoscopy ext.and internal hemorrhoids (Dr. Mechele Collin )12/03/2006// colonoscopy polyps multiple  benign; interal hemorrhoids ,divertics 09/16/2003  . GERD (gastroesophageal reflux disease)     EGD hiatal hernia; esophagitis/sigmoidoscopy WNL (Dr. Mechele Collin) 03/04/1996: ABD. U/S  WNL (06/1996)//EGD multiple polyps ;small  H.H (09/16/2003)  . Chest pain 02/15-16/2009    Hospital [facility]  chest pain rule out //stress Myoview negative 10/21/2007//Stress cardiolite ;apical thinning ,negative ischemia (09/14/2003)  . Osteopenia 12/2011    spine -1.8, hip -0.7  . Hand deformity, congenital     R    Past Surgical History  Procedure Date  . Abdominal hysterectomy 1986    ovaries intact Uterine prolapse  . Nasal sinus surgery 1978  . Vaginal delivery     NSVD x 2  . Tubal ligation   . Dexa 12/2011    spine -1.8, hip -0.7    History  Substance Use Topics  . Smoking status: Never Smoker   . Smokeless tobacco: Never Used  . Alcohol Use: 0.0 oz/week    0 drink(s) per week     rarely - wine    Family History  Problem Relation Age of Onset  . Heart disease Mother 66    MI  . COPD Father     emphysema  . Cancer Father     esophageal and brain cancer  . Heart disease Father 77    CABG  . Alcohol abuse Sister 83    etoh  . Heart disease Brother 105    MI    Allergies  Allergen Reactions  . Shellfish Allergy Anaphylaxis  . Azithromycin     REACTION: rash  . Meperidine Hcl     REACTION: nausea  . Omeprazole     REACTION: rash ([facility])    Medication list has been reviewed and updated.  Outpatient Prescriptions Prior to Visit  Medication Sig Dispense Refill  . busPIRone (BUSPAR) 30 MG tablet Take 1/3 tab by mouth bid      . cetirizine (ZYRTEC ALLERGY) 10 MG tablet Take 10 mg by mouth daily.        . cholecalciferol (VITAMIN D) 1000 UNITS tablet Take 1,000 Units by mouth daily.       . cyclobenzaprine (FLEXERIL) 5 MG tablet Take 1 tablet (5 mg total) by mouth 2 (two) times daily as needed for muscle spasms (sedation precautions).  30 tablet  0  . estradiol (ESTRACE) 1 MG tablet Take 0.5 mg by mouth daily.      Marland Kitchen glucose blood test strip Check sugar daily and if needed for symptoms of elevated sugar are low sugar       . Lancets (FREESTYLE) lancets 1 each by Other route daily. Use as  instructed       . metFORMIN (GLUCOPHAGE) 1000 MG tablet TAKE 1 TABLET TWICE A DAY  60 tablet  10  . naproxen (NAPROSYN) 500 MG tablet Take one po bid x 1 week then prn pain, take with food  60 tablet  0  . omeprazole (PRILOSEC OTC) 20 MG tablet Take 20 mg by mouth daily.        . ranitidine (ZANTAC) 150 MG tablet Take 150 mg by mouth at bedtime.        . traMADol (ULTRAM) 50 MG tablet Take 1 tablet (50 mg total)  by mouth 2 (two) times daily as needed for pain.  30 tablet  0  . VYTORIN 10-80 MG per tablet TAKE 1 TABLET BY MOUTH AT BEDTIME.  30 tablet  11    Review of Systems:   GEN: No fevers, chills. Nontoxic. Primarily MSK c/o today. MSK: Detailed in the HPI GI: tolerating PO intake without difficulty Neuro: detailed above Otherwise the pertinent positives of the ROS are noted above.    Physical Examination: Filed Vitals:   06/26/12 1214  BP: 118/68  Pulse: 84  Temp: 97.8 F (36.6 C)  TempSrc: Oral  Weight: 170 lb (77.111 kg)    There is no height on file to calculate BMI. Ideal Body Weight:     GEN: Well-developed,well-nourished,in no acute distress; alert,appropriate and cooperative throughout examination HEENT: Normocephalic and atraumatic without obvious abnormalities. Ears, externally no deformities PULM: Breathing comfortably in no respiratory distress EXT: No clubbing, cyanosis, or edema PSYCH: Normally interactive. Cooperative during the interview. Pleasant. Friendly and conversant. Not anxious or depressed appearing. Normal, full affect.  Range of motion at  the waist: Flexion, extension, lateral bending and rotation: far flexion to 70. Normal extension and lateral bending.  No echymosis or edema Rises to examination table with mild difficulty Gait: minimally antalgic  Inspection/Deformity: N Paraspinus Tenderness: mild around L3-S1  B Ankle Dorsiflexion (L5,4): 5/5 B Great Toe Dorsiflexion (L5,4): 5/5 Heel Walk (L5): WNL Toe Walk (S1): WNL Rise/Squat  (L4): WNL, mild pain  SENSORY B Medial Foot (L4): WNL B Dorsum (L5): WNL B Lateral (S1): WNL Light Touch: WNL Pinprick: WNL  REFLEXES Knee (L4): 2+ Ankle (S1): 2+  B SLR, seated: neg B SLR, supine: neg B FABER: uncomfortable, but not at the SI joint. B Reverse FABER: neg B Greater Troch: markedly tender on the RIGHT at the deep and superficial bursa. Minorly at the LEFT. B Log Roll: neg B Stork: NT B Sciatic Notch: markedly tender bilaterally.  Assessment and Plan:  1. Trochanteric bursitis   2. Degenerative disc disease, lumbar   3. SI (sacroiliac) joint inflammation    Significant trochanteric bursitis, which hopefully will be amenable to some injection and rehabilitation at least give her some greater relief.  Of underlying concern is some radiculopathy with probable foraminal impingement or nerve encroachment on the RIGHT with some significant degenerative disc disease as  The primary problem. She has multilevel degenerative disc disease with worse through L4-S1. This can be a very challenging problem if symptoms don't abate post injection, then I would suspect more of a spinal origin as the real problem. Physical therapy and potentially an epidural steroid injection could be considered in the future. For now, she would like to be conservative, I think is very reasonable, and we'll give this several months before proceeding in anyway more aggressively.  Trochanteric Bursitis Injection, R Verbal consent obtained. Risks (including infection, potential atrophy), benefits, and alternatives reviewed. Greater trochanter sterilely prepped with Chloraprep. Ethyl Chloride used for anesthesia. 8 cc of Lidocaine 1% injected with 2 cc of 40 mg Depo-Medrol into trochanteric bursa at area of maximal tenderness at greater trochanter. Needle taken to bone to troch bursa, flows easily. Bursa massaged. No bleeding and no complications. Decreased pain after injection. Needle: 22 gauge spinal  needle  Orders Today:  No orders of the defined types were placed in this encounter.    Updated Medication List: (Includes new medications, updates to list, dose adjustments) No orders of the defined types were placed in this encounter.  Medications Discontinued: There are no discontinued medications.   Hannah Beat, MD

## 2012-06-27 ENCOUNTER — Other Ambulatory Visit: Payer: Self-pay | Admitting: Family Medicine

## 2012-06-27 NOTE — Telephone Encounter (Signed)
Sent in

## 2012-06-27 NOTE — Telephone Encounter (Signed)
Patient confirmed she takes 1/3 at a time, but the script is written for 1/2 to last her longer.

## 2012-06-27 NOTE — Telephone Encounter (Signed)
Can we call and verify how she takes buspar 30mg  - 1/2 at a time or 1/3 at a time - bid?

## 2012-06-29 DIAGNOSIS — M5136 Other intervertebral disc degeneration, lumbar region: Secondary | ICD-10-CM | POA: Insufficient documentation

## 2012-07-01 ENCOUNTER — Ambulatory Visit: Payer: Medicare Other | Admitting: Family Medicine

## 2012-07-10 ENCOUNTER — Telehealth: Payer: Self-pay

## 2012-07-10 NOTE — Telephone Encounter (Signed)
Pt left v/m seen 06/26/12 for injection in hip; hip pain improved for one week;pain is now back radiating down leg.Naproxen helps somewhat; should pt continue Naproxen twice a day or what is Dr Copland's recommendation. [facility].

## 2012-07-11 NOTE — Telephone Encounter (Signed)
Call  i think if she can deal with it and it doesn't hurt that bad taking alleve twice a day, then that is reasonable.  With the injection response, I think that the primary problem is coming from your back. If it is bad enough, then the next step is to get an MRI of the back to look at areas where the nerves would be pinched. The spine doctors can do an xray guided injection of steroid there that often helps.   Let me know if she wants to move forward with that

## 2012-07-11 NOTE — Telephone Encounter (Signed)
Patient advised and she wants to wait and see how it feels in a couple of weeks

## 2012-07-12 NOTE — Telephone Encounter (Signed)
ok 

## 2012-07-15 ENCOUNTER — Encounter: Payer: Self-pay | Admitting: Family Medicine

## 2012-07-15 ENCOUNTER — Telehealth: Payer: Self-pay | Admitting: *Deleted

## 2012-07-15 ENCOUNTER — Ambulatory Visit (INDEPENDENT_AMBULATORY_CARE_PROVIDER_SITE_OTHER): Payer: Medicare Other | Admitting: Family Medicine

## 2012-07-15 VITALS — BP 138/82 | HR 90 | Temp 98.3°F | Ht 64.5 in | Wt 168.0 lb

## 2012-07-15 DIAGNOSIS — IMO0002 Reserved for concepts with insufficient information to code with codable children: Secondary | ICD-10-CM

## 2012-07-15 DIAGNOSIS — M5416 Radiculopathy, lumbar region: Secondary | ICD-10-CM

## 2012-07-15 DIAGNOSIS — M461 Sacroiliitis, not elsewhere classified: Secondary | ICD-10-CM | POA: Diagnosis not present

## 2012-07-15 MED ORDER — HYDROCODONE-ACETAMINOPHEN 5-325 MG PO TABS: 1.0000 | ORAL_TABLET | Freq: Four times a day (QID) | ORAL | Status: DC | PRN

## 2012-07-15 NOTE — Telephone Encounter (Signed)
Patient calling crying in a lot of pain and she has taken her aleve and it is not helping at

## 2012-07-15 NOTE — Patient Instructions (Addendum)
REFERRAL: GO THE THE FRONT ROOM AT THE ENTRANCE OF OUR CLINIC, NEAR CHECK IN. ASK FOR Sarah Velasquez. SHE WILL HELP YOU SET UP YOUR REFERRAL. DATE: TIME:  

## 2012-07-15 NOTE — Telephone Encounter (Signed)
Patient is coming in today for appt.

## 2012-07-15 NOTE — Progress Notes (Signed)
Nature conservation officer at [facility] [address] [place] [address] Phone: [phone] Fax: [phone]  Date:  07/15/2012   Name:  Sarah Velasquez   DOB:  Jan 27, 1946   MRN:  [id number] Gender: female Age: 66 y.o.  PCP:  Eustaquio Boyden, MD  Evaluating MD: Hannah Beat, MD   Chief Complaint: Leg Pain and Hip Pain   History of Present Illness:  Sarah Velasquez is a 66 y.o. pleasant patient who presents with the following:  Pleasant patient with a history of some significant back pain, buttocks pain, and lateral hip pain and some radicular pain down the RIGHT side for multiple months. Initially I saw her a few weeks ago, and was concerned about degenerative disc disease, SI joint pain and some significant trochanteric bursitis. I injected her RIGHT trochanteric bursa, and she has some good relief from this latterly or a week or so, but there symptoms have returned. She primarily is having posterior buttock pain and radicular pain now, and it is worse since my initial evaluation.  06/29/2012 OV Much worse in the morning and at night, goes all the way down to her knee. Also has a place on her sides, and also with some lateral side pain.   Sarah Velasquez wasn't patient lives known for many years who presents with right-sided lateral hip pain more than the LEFT. She is intermittently had pain both on the RIGHT and LEFT side. Times previously the LEFT side was significantly worse compared to the RIGHT. At this time, it is mostly her RIGHT side. She is having some discomfort even with lying on her side at nighttime. She is stressed him from taking care of her husband with some frontotemporal dementia.  She denies any groin pain. She also has some posterior buttocks pain on the RIGHT side and some pain that goes down to the knee.  Dg Lumbar Spine Complete  06/11/2012  *RADIOLOGY REPORT*  Clinical Data: Low back pain.  History of hip pain.  History of injury from fall 2 years previously.  LUMBAR SPINE  - COMPLETE 4+ VIEW  Comparison: None.  Findings: SI joints appear intact.  There are five non-rib bearing lumbar-type vertebral bodies which are labeled L1-L5.  There is narrowing of intervertebral disc spaces at multiple levels.  It is most severe at the levels of L4-L5 and L5-S1.  Marginal osteophyte formation is present at multiple levels representing degenerative spondylosis.  No fracture or dislocation is evident.  No pars defects are seen.  There is some apophyseal joint degenerative spondylosis.  IMPRESSION: Changes of degenerative disc disease and degenerative spondylosis. No fracture is evident.   Original Report Authenticated By: Crawford Givens, M.D.    Dg Hip Complete Right  06/11/2012  *RADIOLOGY REPORT*  Clinical Data: History of right hip pain.  RIGHT HIP - COMPLETE 2+ VIEW  Comparison: None.  Findings: There is degenerative spondylosis.  SI joints appear intact.  Hip joint spaces are preserved.  Alignment is normal.  No fracture, bony destruction, or calcific bursitis is evident.  There is osteopenic appearance of bones.  IMPRESSION: There is slightly osteopenic appearance of the bones.  No hip lesion is identified.  There is degenerative spondylosis.   Original Report Authenticated By: Crawford Givens, M.D.     Films were independently reviewed by myself in the office and with the patient and discussed with her. RIGHT hip x-ray: No significant degenerative changes were noted and joint spaces were relatively maintained.  Lumbar spine films: Most significant  for significant degenerative disc disease, most seen around L4-L5, as well as L5-S1. There is even some thoracic degenerative disc disease higher up on the spine films.   Patient Active Problem List  Diagnosis  . DIABETES MELLITUS, TYPE II  . UNSPECIFIED VITAMIN D DEFICIENCY  . Mixed hyperlipidemia  . ANXIETY DISORDER, GENERALIZED  . ALLERGIC RHINITIS  . GERD  . FIBROCYSTIC BREAST DISEASE  . DISORDER, MENOPAUSAL NEC  . LATERAL  EPICONDYLITIS, LEFT  . Fungal dermatitis  . Vertigo  . Osteopenia  . Left hip pain  . Knee pain  . Ankle pain  . Right hip pain  . Degenerative disc disease, lumbar    Past Medical History  Diagnosis Date  . Diabetes mellitus     typeII  . Hyperlipidemia 05/1995  . Diverticulosis of colon 09/2003    colonoscopy ext.and internal hemorrhoids (Dr. Mechele Collin )12/03/2006// colonoscopy polyps multiple  benign; interal hemorrhoids ,divertics 09/16/2003  . GERD (gastroesophageal reflux disease)     EGD hiatal hernia; esophagitis/sigmoidoscopy WNL (Dr. Mechele Collin) 03/04/1996: ABD. U/S  WNL (06/1996)//EGD multiple polyps ;small H.H (09/16/2003)  . Chest pain 02/15-16/2009    Hospital [facility]  chest pain rule out //stress Myoview negative 10/21/2007//Stress cardiolite ;apical thinning ,negative ischemia (09/14/2003)  . Osteopenia 12/2011    spine -1.8, hip -0.7  . Hand deformity, congenital     R    Past Surgical History  Procedure Date  . Abdominal hysterectomy 1986    ovaries intact Uterine prolapse  . Nasal sinus surgery 1978  . Vaginal delivery     NSVD x 2  . Tubal ligation   . Dexa 12/2011    spine -1.8, hip -0.7    History  Substance Use Topics  . Smoking status: Never Smoker   . Smokeless tobacco: Never Used  . Alcohol Use: 0.0 oz/week    0 drink(s) per week     Comment: rarely - wine    Family History  Problem Relation Age of Onset  . Heart disease Mother 30    MI  . COPD Father     emphysema  . Cancer Father     esophageal and brain cancer  . Heart disease Father 30    CABG  . Alcohol abuse Sister 54    etoh  . Heart disease Brother 68    MI    Allergies  Allergen Reactions  . Shellfish Allergy Anaphylaxis  . Azithromycin     REACTION: rash  . Meperidine Hcl     REACTION: nausea  . Omeprazole     REACTION: rash ([facility])    Medication list has been reviewed and updated.  Outpatient Prescriptions Prior to Visit  Medication Sig Dispense Refill  .  busPIRone (BUSPAR) 30 MG tablet Take 0.5 tablets (15 mg total) by mouth 2 (two) times daily. (pt takes 1/3)  30 tablet  11  . cetirizine (ZYRTEC ALLERGY) 10 MG tablet Take 10 mg by mouth daily.        . cholecalciferol (VITAMIN D) 1000 UNITS tablet Take 1,000 Units by mouth daily.       . cyclobenzaprine (FLEXERIL) 5 MG tablet Take 1 tablet (5 mg total) by mouth 2 (two) times daily as needed for muscle spasms (sedation precautions).  30 tablet  0  . estradiol (ESTRACE) 1 MG tablet Take 0.5 mg by mouth daily.      Marland Kitchen glucose blood test strip Check sugar daily and if needed for symptoms of elevated sugar are low sugar       .  Lancets (FREESTYLE) lancets 1 each by Other route daily. Use as instructed       . metFORMIN (GLUCOPHAGE) 1000 MG tablet TAKE 1 TABLET TWICE A DAY  60 tablet  10  . naproxen (NAPROSYN) 500 MG tablet Take one po bid x 1 week then prn pain, take with food  60 tablet  0  . omeprazole (PRILOSEC OTC) 20 MG tablet Take 20 mg by mouth daily.        . ranitidine (ZANTAC) 150 MG tablet Take 150 mg by mouth at bedtime.        Marland Kitchen VYTORIN 10-80 MG per tablet TAKE 1 TABLET BY MOUTH AT BEDTIME.  30 tablet  11  . traMADol (ULTRAM) 50 MG tablet Take 1 tablet (50 mg total) by mouth 2 (two) times daily as needed for pain.  30 tablet  0   Last reviewed on 06/26/2012 12:17 PM by Sydell Axon, LPN  Review of Systems:   GEN: No fevers, chills. Nontoxic. Primarily MSK c/o today. MSK: Detailed in the HPI GI: tolerating PO intake without difficulty Neuro: detailed above Otherwise the pertinent positives of the ROS are noted above.    Physical Examination: Filed Vitals:   07/15/12 1501  BP: 138/82  Pulse: 90  Temp: 98.3 F (36.8 C)  TempSrc: Oral  Height: 5' 4.5" (1.638 m)  Weight: 168 lb (76.204 kg)  SpO2: 97%    Body mass index is 28.39 kg/(m^2). Ideal Body Weight: Weight in (lb) to have BMI = 25: 147.6   GEN: Well-developed,well-nourished,in no acute distress; alert,appropriate and  cooperative throughout examination HEENT: Normocephalic and atraumatic without obvious abnormalities. Ears, externally no deformities PULM: Breathing comfortably in no respiratory distress EXT: No clubbing, cyanosis, or edema PSYCH: Normally interactive. Cooperative during the interview. Pleasant. Friendly and conversant. Not anxious or depressed appearing. Normal, full affect.  Range of motion at  the waist: Flexion, extension, lateral bending and rotation: far flexion to 70. Normal extension and lateral bending.  No echymosis or edema Rises to examination table with mild difficulty Gait: minimally antalgic  Inspection/Deformity: N Paraspinus Tenderness: mild around L3-S1  B Ankle Dorsiflexion (L5,4): 5/5 B Great Toe Dorsiflexion (L5,4): 5/5 Heel Walk (L5): WNL Toe Walk (S1): WNL Rise/Squat (L4): WNL, mild pain  SENSORY B Medial Foot (L4): WNL B Dorsum (L5): WNL B Lateral (S1): WNL Light Touch: WNL Pinprick: WNL  REFLEXES Knee (L4): 2+ Ankle (S1): 2+  B SLR, seated: neg B SLR, supine: pos on R B FABER: uncomfortable, but not at the SI joint. B Reverse FABER: neg B Greater Troch: minimally tender B Log Roll: neg B Stork: NT B Sciatic Notch: markedly tender bilaterally.   Assessment and Plan:   1. Lumbar radiculopathy  MR Lumbar Spine Wo Contrast  2. SI (sacroiliac) joint inflammation     Clinical concern for nerve encroachment. Obtain MRI of the lumbar spine without contrast to evaluate for foraminal impingement, spinal stenosis, spinal cord edema, or other occult spinal process. Worsening patient with failure of conservative treatment for multiple months.  Orders Today:  Orders Placed This Encounter  Procedures  . MR Lumbar Spine Wo Contrast    Standing Status: Future     Number of Occurrences:      Standing Expiration Date: 09/14/2013    Order Specific Question:  Does the patient have a pacemaker, internal devices, implants, aneury    Answer:  No    Order  Specific Question:  Preferred imaging location?    Answer:  External  Order Specific Question:  Reason for exam:    Answer:  [facility], R radiculopathy, severe and worsened, multiple months. known DDD    Updated Medication List: (Includes new medications, updates to list, dose adjustments) Meds ordered this encounter  Medications  . HYDROcodone-acetaminophen (NORCO/VICODIN) 5-325 MG per tablet    Sig: Take 1 tablet by mouth every 6 (six) hours as needed for pain.    Dispense:  50 tablet    Refill:  0    Medications Discontinued: There are no discontinued medications.   Hannah Beat, MD

## 2012-07-17 ENCOUNTER — Telehealth: Payer: Self-pay | Admitting: *Deleted

## 2012-07-17 MED ORDER — PREDNISONE 20 MG PO TABS: ORAL_TABLET | ORAL | Status: DC

## 2012-07-17 NOTE — Telephone Encounter (Signed)
Let's also give her a prednisone taper to take -- this will likely increase her blood sugar while she is on it.   Prednisone 20 mg, 2 po x 5 days, then 1 po x 5 days, #15

## 2012-07-17 NOTE — Telephone Encounter (Signed)
Patient calling says pain medication you give her only last about 2 hours and the she has to be in severe pain for 4 hours before she can take another pain pill. Patient says she can not have her MRI until Monday. Patient wants to know what else she can do. Please advise.  [facility] [address]

## 2012-07-17 NOTE — Telephone Encounter (Signed)
Patient advised and rx sent to pharmacy  

## 2012-07-22 ENCOUNTER — Ambulatory Visit: Payer: Self-pay | Admitting: Family Medicine

## 2012-07-22 DIAGNOSIS — M5126 Other intervertebral disc displacement, lumbar region: Secondary | ICD-10-CM | POA: Diagnosis not present

## 2012-07-22 DIAGNOSIS — Q762 Congenital spondylolisthesis: Secondary | ICD-10-CM | POA: Diagnosis not present

## 2012-07-23 ENCOUNTER — Encounter: Payer: Self-pay | Admitting: Family Medicine

## 2012-07-24 ENCOUNTER — Other Ambulatory Visit: Payer: Self-pay | Admitting: Family Medicine

## 2012-07-24 DIAGNOSIS — M5116 Intervertebral disc disorders with radiculopathy, lumbar region: Secondary | ICD-10-CM

## 2012-07-29 ENCOUNTER — Other Ambulatory Visit: Payer: Self-pay

## 2012-07-29 MED ORDER — HYDROCODONE-ACETAMINOPHEN 5-325 MG PO TABS: 1.0000 | ORAL_TABLET | Freq: Four times a day (QID) | ORAL | Status: DC | PRN

## 2012-07-29 NOTE — Telephone Encounter (Signed)
Pt left v/m requesting refill [facility]one-apap to [facility]. Pt has appt spine specialist on 08/08/12.pt request call back.

## 2012-07-29 NOTE — Telephone Encounter (Signed)
Ok to refill #50, 0 refills 

## 2012-07-29 NOTE — Telephone Encounter (Signed)
rx called pharmacy  

## 2012-08-08 DIAGNOSIS — IMO0002 Reserved for concepts with insufficient information to code with codable children: Secondary | ICD-10-CM | POA: Diagnosis not present

## 2012-08-08 DIAGNOSIS — M48062 Spinal stenosis, lumbar region with neurogenic claudication: Secondary | ICD-10-CM | POA: Diagnosis not present

## 2012-08-08 DIAGNOSIS — M5126 Other intervertebral disc displacement, lumbar region: Secondary | ICD-10-CM | POA: Diagnosis not present

## 2012-08-13 DIAGNOSIS — M47817 Spondylosis without myelopathy or radiculopathy, lumbosacral region: Secondary | ICD-10-CM | POA: Diagnosis not present

## 2012-08-13 DIAGNOSIS — IMO0002 Reserved for concepts with insufficient information to code with codable children: Secondary | ICD-10-CM | POA: Diagnosis not present

## 2012-08-13 DIAGNOSIS — M48062 Spinal stenosis, lumbar region with neurogenic claudication: Secondary | ICD-10-CM | POA: Diagnosis not present

## 2012-08-16 ENCOUNTER — Encounter: Payer: Self-pay | Admitting: Family Medicine

## 2012-08-25 ENCOUNTER — Other Ambulatory Visit: Payer: Self-pay | Admitting: Family Medicine

## 2012-08-26 DIAGNOSIS — IMO0002 Reserved for concepts with insufficient information to code with codable children: Secondary | ICD-10-CM | POA: Diagnosis not present

## 2012-08-26 DIAGNOSIS — M48062 Spinal stenosis, lumbar region with neurogenic claudication: Secondary | ICD-10-CM | POA: Diagnosis not present

## 2012-08-26 DIAGNOSIS — M47817 Spondylosis without myelopathy or radiculopathy, lumbosacral region: Secondary | ICD-10-CM | POA: Diagnosis not present

## 2012-09-03 DIAGNOSIS — M5126 Other intervertebral disc displacement, lumbar region: Secondary | ICD-10-CM | POA: Diagnosis not present

## 2012-09-03 DIAGNOSIS — IMO0002 Reserved for concepts with insufficient information to code with codable children: Secondary | ICD-10-CM | POA: Diagnosis not present

## 2012-09-03 DIAGNOSIS — M48062 Spinal stenosis, lumbar region with neurogenic claudication: Secondary | ICD-10-CM | POA: Diagnosis not present

## 2012-09-11 ENCOUNTER — Encounter: Payer: Self-pay | Admitting: Family Medicine

## 2012-09-20 DIAGNOSIS — M48062 Spinal stenosis, lumbar region with neurogenic claudication: Secondary | ICD-10-CM | POA: Diagnosis not present

## 2012-09-20 DIAGNOSIS — IMO0002 Reserved for concepts with insufficient information to code with codable children: Secondary | ICD-10-CM | POA: Diagnosis not present

## 2012-09-20 DIAGNOSIS — M5126 Other intervertebral disc displacement, lumbar region: Secondary | ICD-10-CM | POA: Diagnosis not present

## 2012-09-21 ENCOUNTER — Other Ambulatory Visit: Payer: Self-pay | Admitting: Family Medicine

## 2012-09-21 DIAGNOSIS — Z0000 Encounter for general adult medical examination without abnormal findings: Secondary | ICD-10-CM

## 2012-09-21 DIAGNOSIS — E782 Mixed hyperlipidemia: Secondary | ICD-10-CM

## 2012-09-21 DIAGNOSIS — E119 Type 2 diabetes mellitus without complications: Secondary | ICD-10-CM

## 2012-09-24 ENCOUNTER — Other Ambulatory Visit (INDEPENDENT_AMBULATORY_CARE_PROVIDER_SITE_OTHER): Payer: Medicare Other

## 2012-09-24 DIAGNOSIS — E782 Mixed hyperlipidemia: Secondary | ICD-10-CM | POA: Diagnosis not present

## 2012-09-24 DIAGNOSIS — E119 Type 2 diabetes mellitus without complications: Secondary | ICD-10-CM | POA: Diagnosis not present

## 2012-09-24 LAB — BASIC METABOLIC PANEL
CO2: 27 mEq/L (ref 19–32)
Chloride: 106 mEq/L (ref 96–112)
Creatinine, Ser: 0.8 mg/dL (ref 0.4–1.2)
Sodium: 141 mEq/L (ref 135–145)

## 2012-09-24 LAB — LIPID PANEL
LDL Cholesterol: 60 mg/dL (ref 0–99)
Total CHOL/HDL Ratio: 3
Triglycerides: 142 mg/dL (ref 0.0–149.0)

## 2012-09-24 LAB — HEMOGLOBIN A1C: Hgb A1c MFr Bld: 6.8 % — ABNORMAL HIGH (ref 4.6–6.5)

## 2012-09-24 LAB — MICROALBUMIN / CREATININE URINE RATIO
Creatinine,U: 218.5 mg/dL
Microalb, Ur: 1.3 mg/dL (ref 0.0–1.9)

## 2012-09-27 DIAGNOSIS — M5126 Other intervertebral disc displacement, lumbar region: Secondary | ICD-10-CM | POA: Diagnosis not present

## 2012-09-27 DIAGNOSIS — IMO0002 Reserved for concepts with insufficient information to code with codable children: Secondary | ICD-10-CM | POA: Diagnosis not present

## 2012-09-30 ENCOUNTER — Encounter: Payer: Self-pay | Admitting: Family Medicine

## 2012-09-30 ENCOUNTER — Ambulatory Visit (INDEPENDENT_AMBULATORY_CARE_PROVIDER_SITE_OTHER): Payer: Medicare Other | Admitting: Family Medicine

## 2012-09-30 VITALS — BP 126/78 | HR 80 | Temp 98.1°F | Ht 64.0 in | Wt 157.8 lb

## 2012-09-30 DIAGNOSIS — Z0000 Encounter for general adult medical examination without abnormal findings: Secondary | ICD-10-CM | POA: Diagnosis not present

## 2012-09-30 DIAGNOSIS — M5137 Other intervertebral disc degeneration, lumbosacral region: Secondary | ICD-10-CM

## 2012-09-30 DIAGNOSIS — Z1211 Encounter for screening for malignant neoplasm of colon: Secondary | ICD-10-CM

## 2012-09-30 DIAGNOSIS — E782 Mixed hyperlipidemia: Secondary | ICD-10-CM

## 2012-09-30 DIAGNOSIS — M5136 Other intervertebral disc degeneration, lumbar region: Secondary | ICD-10-CM

## 2012-09-30 DIAGNOSIS — E119 Type 2 diabetes mellitus without complications: Secondary | ICD-10-CM

## 2012-09-30 NOTE — Assessment & Plan Note (Signed)
Chronic, stable. Reviewed #s with pt. Weight loss has helped.

## 2012-09-30 NOTE — Progress Notes (Signed)
Subjective:    Patient ID: Sarah Velasquez, female    DOB: [birth year], 67 y.o.   MRN: [id number]  HPI CC: medicare wellness  Pleasant 67 yo with h/o DM presents for medicare wellness visit.  Planning on having surgery for DDD and L3 lumbar radiculitis.  To see Dr. Marikay Alar with [facility].  On oxycodone which doesn't help.  Hydrocodone didn't help.  Initially saw Dr. Yves Dill (physiatrist) s/p 2 ESI.  Cannot tolerate gabapentin.  Has tried cymbalta.  Takes care of ill husband.  Struggles with his progressively worsening dementia.  Wt Readings from Last 3 Encounters:  09/30/12 157 lb 12 oz (71.555 kg)  07/15/12 168 lb (76.204 kg)  06/26/12 170 lb (77.111 kg)  states lost 13 lbs from feeling ill, in pain.  Preventative: Colonoscopy - [facility]) 2008. H/o polyps.  Due for rpt.  No blood in stool.   Well woman with OBGYN.  01/2012.  Had pap smear done 12/2010. Mammogram 01/2012 normal. UTD flu, shingles, pneumonia Td 2010 Dexa 12/2011 normal per pt Advanced directives: has at home.  Asked her to bring one at home.  Declines prolonged life support.  Passes hearing and vision screens today.  Vision screen 11/2011. Declines falls in last year. Declines depression/sadness, anhedonia  Medications and allergies reviewed and updated in chart.  Past histories reviewed and updated if relevant as below. Patient Active Problem List  Diagnosis  . DIABETES MELLITUS, TYPE II  . UNSPECIFIED VITAMIN D DEFICIENCY  . Mixed hyperlipidemia  . ANXIETY DISORDER, GENERALIZED  . ALLERGIC RHINITIS  . GERD  . FIBROCYSTIC BREAST DISEASE  . DISORDER, MENOPAUSAL NEC  . LATERAL EPICONDYLITIS, LEFT  . Fungal dermatitis  . Vertigo  . Osteopenia  . Left hip pain  . Knee pain  . Ankle pain  . Right hip pain  . Degenerative disc disease, lumbar  . Medicare annual wellness visit, initial   Past Medical History  Diagnosis Date  . Diabetes mellitus     typeII  . Hyperlipidemia 05/1995  . Diverticulosis  of colon 09/2003    colonoscopy ext.and internal hemorrhoids (Dr. Mechele Collin )12/03/2006// colonoscopy polyps multiple  benign; interal hemorrhoids ,divertics 09/16/2003  . GERD (gastroesophageal reflux disease)     EGD hiatal hernia; esophagitis/sigmoidoscopy WNL (Dr. Mechele Collin) 03/04/1996: ABD. U/S  WNL (06/1996)//EGD multiple polyps ;small H.H (09/16/2003)  . Chest pain 02/15-16/2009    Hospital [facility]  chest pain rule out //stress Myoview negative 10/21/2007//Stress cardiolite ;apical thinning ,negative ischemia (09/14/2003)  . Osteopenia 12/2011    spine -1.8, hip -0.7  . Hand deformity, congenital     R  . Right lumbar radiculitis 2013    DDD, spondylosis, L3 radiculitis (MRI 07/2012) - ESI 09/2011 (Dr Yves Dill)    Past Surgical History  Procedure Date  . Abdominal hysterectomy 1986    ovaries intact Uterine prolapse  . Nasal sinus surgery 1978  . Vaginal delivery     NSVD x 2  . Tubal ligation   . Dexa 12/2011    spine -1.8, hip -0.7  . Esi 09/2011    (Chasnis, Gavin Potters)   History  Substance Use Topics  . Smoking status: Never Smoker   . Smokeless tobacco: Never Used  . Alcohol Use: 0.0 oz/week    0 drink(s) per week     Comment: rarely - wine   Family History  Problem Relation Age of Onset  . Heart disease Mother 71    MI  . COPD Father     emphysema  .  Cancer Father     esophageal and brain cancer  . Heart disease Father 55    CABG  . Alcohol abuse Sister 19    etoh  . Heart disease Brother 67    MI   Allergies  Allergen Reactions  . Shellfish Allergy Anaphylaxis  . Azithromycin     REACTION: rash  . Meperidine Hcl     REACTION: nausea  . Neurontin (Gabapentin) Other (See Comments)    Incoherent  . Omeprazole     REACTION: rash ([facility] brand)   Current Outpatient Prescriptions on File Prior to Visit  Medication Sig Dispense Refill  . cetirizine (ZYRTEC ALLERGY) 10 MG tablet Take 10 mg by mouth daily.        . cholecalciferol (VITAMIN D) 1000 UNITS tablet  Take 1,000 Units by mouth daily.       Marland Kitchen estradiol (ESTRACE) 1 MG tablet Take 0.5 mg by mouth daily.      Marland Kitchen glucose blood test strip Check sugar daily and if needed for symptoms of elevated sugar are low sugar       . Lancets (FREESTYLE) lancets 1 each by Other route daily. Use as instructed       . metFORMIN (GLUCOPHAGE) 1000 MG tablet TAKE 1 TABLET TWICE A DAY  180 tablet  3  . omeprazole (PRILOSEC OTC) 20 MG tablet Take 20 mg by mouth daily.        . ranitidine (ZANTAC) 150 MG tablet Take 150 mg by mouth at bedtime.        Marland Kitchen VYTORIN 10-80 MG per tablet TAKE 1 TABLET BY MOUTH AT BEDTIME.  30 tablet  11     Review of Systems  Constitutional: Negative for fever, chills, activity change, appetite change, fatigue and unexpected weight change.  HENT: Negative for hearing loss and neck pain.   Eyes: Negative for visual disturbance.  Respiratory: Negative for cough, chest tightness, shortness of breath and wheezing.   Cardiovascular: Negative for chest pain, palpitations and leg swelling.  Gastrointestinal: Positive for constipation (from narcotic). Negative for nausea, vomiting, abdominal pain, diarrhea, blood in stool and abdominal distention.  Genitourinary: Negative for hematuria and difficulty urinating.  Musculoskeletal: Negative for myalgias and arthralgias.  Skin: Negative for rash.  Neurological: Negative for dizziness, seizures, syncope and headaches.  Hematological: Does not bruise/bleed easily.  Psychiatric/Behavioral: Negative for dysphoric mood. The patient is not nervous/anxious.        Objective:   Physical Exam  Nursing note and vitals reviewed. Constitutional: She is oriented to person, place, and time. She appears well-developed and well-nourished. No distress.  HENT:  Head: Normocephalic and atraumatic.  Right Ear: Hearing, tympanic membrane, external ear and ear canal normal.  Left Ear: Hearing, tympanic membrane, external ear and ear canal normal.  Nose: Nose  normal.  Mouth/Throat: Oropharynx is clear and moist. No oropharyngeal exudate.  Eyes: Conjunctivae normal and EOM are normal. Pupils are equal, round, and reactive to light. No scleral icterus.  Neck: Normal range of motion. Neck supple. Carotid bruit is not present. No thyromegaly present.  Cardiovascular: Normal rate, regular rhythm, normal heart sounds and intact distal pulses.   No murmur heard. Pulses:      Radial pulses are 2+ on the right side, and 2+ on the left side.  Pulmonary/Chest: Effort normal and breath sounds normal. No respiratory distress. She has no wheezes. She has no rales.  Abdominal: Soft. Bowel sounds are normal. She exhibits no distension and no mass. There is  no tenderness. There is no rebound and no guarding.  Musculoskeletal: Normal range of motion. She exhibits no edema.       Congenital deformity R hand  Lymphadenopathy:    She has no cervical adenopathy.  Neurological: She is alert and oriented to person, place, and time.       CN grossly intact, station and gait intact  Skin: Skin is warm and dry. No rash noted.  Psychiatric: She has a normal mood and affect. Her behavior is normal. Judgment and thought content normal.       Assessment & Plan:

## 2012-09-30 NOTE — Assessment & Plan Note (Signed)
Chronic, stable. Good control. Anticipate weight loss has helped.

## 2012-09-30 NOTE — Assessment & Plan Note (Signed)
With L3 lumbar radiculitis.  Sees Dr. Yetta Barre of Neurosurgery.  To schedule lumbar spine surgery.

## 2012-09-30 NOTE — Patient Instructions (Addendum)
Stool kit today. Good to see you today, call us with questions. Return as needed or in 6 months for diabetes follow up, prior fasting for blood work. Bring me copy of advanced directives for your chart.

## 2012-09-30 NOTE — Assessment & Plan Note (Signed)
I have personally reviewed the Medicare Annual Wellness questionnaire and have noted 1. The patient's medical and social history 2. Their use of alcohol, tobacco or illicit drugs 3. Their current medications and supplements 4. The patient's functional ability including ADL's, fall risks, home safety risks and hearing or visual impairment. 5. Diet and physical activity 6. Evidence for depression or mood disorders The patients weight, height, BMI have been recorded in the chart.  Hearing and vision has been addressed. I have made referrals, counseling and provided education to the patient based review of the above and I have provided the pt with a written personalized care plan for preventive services. See scanned questionairre. Advanced directives discussed: has at home - asked her to bring copy for chart.  Reviewed preventative protocols and updated unless pt declined. UTD preventative protocols except for colonoscopy - sent home with iFOB.  Planning on scheduling colonoscopy after back surgery.

## 2012-10-03 ENCOUNTER — Other Ambulatory Visit: Payer: Self-pay | Admitting: Family Medicine

## 2012-10-04 ENCOUNTER — Other Ambulatory Visit: Payer: Self-pay | Admitting: Neurological Surgery

## 2012-10-04 NOTE — Telephone Encounter (Signed)
[facility]ill one touch lancets # 100 x 3.

## 2012-10-05 ENCOUNTER — Other Ambulatory Visit: Payer: Self-pay | Admitting: Family Medicine

## 2012-10-08 ENCOUNTER — Other Ambulatory Visit (INDEPENDENT_AMBULATORY_CARE_PROVIDER_SITE_OTHER): Payer: Medicare Other

## 2012-10-08 ENCOUNTER — Encounter: Payer: Self-pay | Admitting: *Deleted

## 2012-10-08 DIAGNOSIS — Z1211 Encounter for screening for malignant neoplasm of colon: Secondary | ICD-10-CM | POA: Diagnosis not present

## 2012-10-08 LAB — FECAL OCCULT BLOOD, IMMUNOCHEMICAL: Fecal Occult Bld: NEGATIVE

## 2012-10-11 NOTE — Pre-Procedure Instructions (Signed)
JEHIELI BRASSELL  10/11/2012   Your procedure is scheduled on:  Thursday October 17, 2012.  Report to [facility] at 9:15 AM.  Call this number if you have problems the morning of surgery: [phone]   Remember:   Do not eat food or drink liquids after midnight.   Take these medicines the morning of surgery with A SIP OF WATER: Estradiol (Estrace), Omeprazole (Prilosec), Oxycodone (Percocet) if needed for pain   Do not wear jewelry, make-up or nail polish.  Do not wear lotions, powders, or perfumes. You may NOT wear deodorant.  Do not shave 48 hours prior to surgery.   Do not bring valuables to the hospital.  Contacts, dentures or bridgework may not be worn into surgery.  Leave suitcase in the car. After surgery it may be brought to your room.  For patients admitted to the hospital, checkout time is 11:00 AM the day of discharge.   Patients discharged the day of surgery will not be allowed to drive home.  Name and phone number of your driver: Family/Friend  Special Instructions: Shower using CHG 2 nights before surgery and the night before surgery.  If you shower the day of surgery use CHG.  Use special wash - you have one bottle of CHG for all showers.  You should use approximately 1/3 of the bottle for each shower.   Please read over the following fact sheets that you were given: Pain Booklet, Coughing and Deep Breathing, MRSA Information and Surgical Site Infection Prevention

## 2012-10-14 ENCOUNTER — Encounter (HOSPITAL_COMMUNITY): Payer: Self-pay

## 2012-10-14 ENCOUNTER — Encounter (HOSPITAL_COMMUNITY)
Admission: RE | Admit: 2012-10-14 | Discharge: 2012-10-14 | Disposition: A | Discharge status: Home / Self Care | Payer: Medicare Other | Source: Ambulatory Visit | Attending: Neurological Surgery | Admitting: Neurological Surgery

## 2012-10-14 DIAGNOSIS — Z01811 Encounter for preprocedural respiratory examination: Secondary | ICD-10-CM | POA: Diagnosis not present

## 2012-10-14 DIAGNOSIS — Z01812 Encounter for preprocedural laboratory examination: Secondary | ICD-10-CM | POA: Diagnosis not present

## 2012-10-14 DIAGNOSIS — Z01810 Encounter for preprocedural cardiovascular examination: Secondary | ICD-10-CM | POA: Insufficient documentation

## 2012-10-14 DIAGNOSIS — Z01818 Encounter for other preprocedural examination: Secondary | ICD-10-CM | POA: Diagnosis not present

## 2012-10-14 HISTORY — DX: Other specified postprocedural states: Z98.890

## 2012-10-14 HISTORY — DX: Other allergy status, other than to drugs and biological substances: Z91.09

## 2012-10-14 HISTORY — DX: Personal history of other diseases of the digestive system: Z87.19

## 2012-10-14 HISTORY — DX: Dizziness and giddiness: R42

## 2012-10-14 HISTORY — DX: Nausea with vomiting, unspecified: R11.2

## 2012-10-14 HISTORY — DX: Drug induced constipation: K59.03

## 2012-10-14 LAB — CBC WITH DIFFERENTIAL/PLATELET
Basophils Absolute: 0 10*3/uL (ref 0.0–0.1)
Eosinophils Absolute: 0.2 10*3/uL (ref 0.0–0.7)
Eosinophils Relative: 2 % (ref 0–5)
MCH: 29.3 pg (ref 26.0–34.0)
MCV: 88.2 fL (ref 78.0–100.0)
Neutrophils Relative %: 56 % (ref 43–77)
Platelets: 242 10*3/uL (ref 150–400)
RBC: 4.98 MIL/uL (ref 3.87–5.11)
RDW: 13.3 % (ref 11.5–15.5)
WBC: 8.5 10*3/uL (ref 4.0–10.5)

## 2012-10-14 LAB — BASIC METABOLIC PANEL
CO2: 28 mEq/L (ref 19–32)
Calcium: 10 mg/dL (ref 8.4–10.5)
Creatinine, Ser: 0.72 mg/dL (ref 0.50–1.10)
GFR calc non Af Amer: 88 mL/min — ABNORMAL LOW (ref >90)
Glucose, Bld: 115 mg/dL — ABNORMAL HIGH (ref 70–99)
Sodium: 143 mEq/L (ref 135–145)

## 2012-10-14 LAB — PROTIME-INR
INR: 0.99 (ref 0.00–1.49)
Prothrombin Time: 13 seconds (ref 11.6–15.2)

## 2012-10-14 LAB — SURGICAL PCR SCREEN: MRSA, PCR: NEGATIVE

## 2012-10-14 NOTE — Progress Notes (Signed)
Patient informed Nurse that she had a stress test a few years ago at [facility] with Dr. Juanito Doom. Will request records. Patient denied having a cardiac cath or sleep study. PCP is Dr. Eustaquio Boyden. LOV in EPIC.

## 2012-10-15 ENCOUNTER — Other Ambulatory Visit: Payer: Self-pay

## 2012-10-15 MED ORDER — GLUCOSE BLOOD VI STRP: 1.0000 | ORAL_STRIP | Freq: Every day | Status: DC

## 2012-10-15 NOTE — Telephone Encounter (Signed)
[facility]; if test more than once a day pt has to do a BS log for 28 days. Medicare guidelines are for pt to test once daily. Pt wants new rx sent with instructions to test once daily to [facility] [address] (pt states she only test once daily). Pt is scheduled for surgery on Thur 10/17/12 and will  have 3-6 week recovery period. Pt request call back when sent to [facility] ASAP.Please advise.

## 2012-10-15 NOTE — Telephone Encounter (Signed)
plz notify sent in. 

## 2012-10-16 MED ORDER — CEFAZOLIN SODIUM-DEXTROSE 2-3 GM-% IV SOLR: 2.0000 g | INTRAVENOUS | Status: AC

## 2012-10-16 NOTE — Telephone Encounter (Signed)
Patient notified

## 2012-10-25 ENCOUNTER — Encounter (HOSPITAL_COMMUNITY): Payer: Self-pay | Admitting: Pharmacy Technician

## 2012-11-01 ENCOUNTER — Encounter (HOSPITAL_COMMUNITY): Payer: Self-pay

## 2012-11-01 ENCOUNTER — Encounter (HOSPITAL_COMMUNITY)
Admission: RE | Admit: 2012-11-01 | Discharge: 2012-11-01 | Disposition: A | Discharge status: Home / Self Care | Payer: Medicare Other | Source: Ambulatory Visit | Attending: Neurological Surgery | Admitting: Neurological Surgery

## 2012-11-01 DIAGNOSIS — Z01812 Encounter for preprocedural laboratory examination: Secondary | ICD-10-CM | POA: Diagnosis not present

## 2012-11-01 DIAGNOSIS — Z79899 Other long term (current) drug therapy: Secondary | ICD-10-CM | POA: Diagnosis not present

## 2012-11-01 DIAGNOSIS — E119 Type 2 diabetes mellitus without complications: Secondary | ICD-10-CM | POA: Diagnosis not present

## 2012-11-01 DIAGNOSIS — M5126 Other intervertebral disc displacement, lumbar region: Secondary | ICD-10-CM | POA: Diagnosis not present

## 2012-11-01 LAB — BASIC METABOLIC PANEL
BUN: 16 mg/dL (ref 6–23)
Calcium: 10 mg/dL (ref 8.4–10.5)
Creatinine, Ser: 0.82 mg/dL (ref 0.50–1.10)
GFR calc Af Amer: 85 mL/min — ABNORMAL LOW (ref >90)
GFR calc non Af Amer: 73 mL/min — ABNORMAL LOW (ref >90)

## 2012-11-01 LAB — CBC
MCHC: 34.6 g/dL (ref 30.0–36.0)
Platelets: 258 10*3/uL (ref 150–400)
RDW: 13.4 % (ref 11.5–15.5)
WBC: 11.9 10*3/uL — ABNORMAL HIGH (ref 4.0–10.5)

## 2012-11-01 NOTE — Pre-Procedure Instructions (Addendum)
Sarah Velasquez  11/01/2012   Your procedure is scheduled on:  Wednesday March 5th, 2014.  Report to [facility] at 6:30 AM.  Call this number if you have problems the morning of surgery: [phone]   Remember:   Do not eat food or drink liquids after midnight.   Take these medicines the morning of surgery with A SIP OF WATER: Omeprazole (Prilosec). Hydrocodone- Acetaminophen if needed for pain.  Stop taking Aspirin, Coumadin, Plavix, Effient and Herbal medications.  Do not take any NSAIDs ie:  Vytorin Ibuprofen,  Advil,Naproxen or any medication containing Aspirin.   Do not wear jewelry, make-up or nail polish.  Do not wear lotions, powders, or perfumes. You may NOT wear deodorant.  Do not shave 48 hours prior to surgery.   Do not bring valuables to the hospital.  Contacts, dentures or bridgework may not be worn into surgery.  Leave suitcase in the car. After surgery it may be brought to your room.  For patients admitted to the hospital, checkout time is 11:00 AM the day of discharge.   Patients discharged the day of surgery will not be allowed to drive home.  Name and phone number of your driver: Family/Friend  Special Instructions: Shower using CHG 2 nights before surgery and the night before surgery.  If you shower the day of surgery use CHG.  Use special wash - you have one bottle of CHG for all showers.  You should use approximately 1/3 of the bottle for each shower.   Please read over the following fact sheets that you were given: Pain Booklet, Coughing and Deep Breathing, MRSA Information and Surgical Site Infection Prevention

## 2012-11-01 NOTE — Pre-Procedure Instructions (Addendum)
Sarah Velasquez  11/01/2012   Your procedure is scheduled on:  Thursday October 17, 2012.  Report to [facility] at 9:15 AM.  Call this number if you have problems the morning of surgery: [phone]   Remember:   Do not eat food or drink liquids after midnight.   Take these medicines the morning of surgery with A SIP OF WATER: Omeprazole (Prilosec). Hydrocodone- Acetaminophen if needed for pain.  Stop taking Aspirin, Coumadin, Plavix, Effient and Herbal medications.  Do not take any NSAIDs ie:  Vytorin Ibuprofen,  Advil,Naproxen or any medication containing Aspirin.   Do not wear jewelry, make-up or nail polish.  Do not wear lotions, powders, or perfumes. You may NOT wear deodorant.  Do not shave 48 hours prior to surgery.   Do not bring valuables to the hospital.  Contacts, dentures or bridgework may not be worn into surgery.  Leave suitcase in the car. After surgery it may be brought to your room.  For patients admitted to the hospital, checkout time is 11:00 AM the day of discharge.   Patients discharged the day of surgery will not be allowed to drive home.  Name and phone number of your driver: Family/Friend  Special Instructions: Shower using CHG 2 nights before surgery and the night before surgery.  If you shower the day of surgery use CHG.  Use special wash - you have one bottle of CHG for all showers.  You should use approximately 1/3 of the bottle for each shower.   Please read over the following fact sheets that you were given: Pain Booklet, Coughing and Deep Breathing, MRSA Information and Surgical Site Infection Prevention

## 2012-11-06 ENCOUNTER — Encounter (HOSPITAL_COMMUNITY): Payer: Self-pay | Admitting: Anesthesiology

## 2012-11-06 ENCOUNTER — Ambulatory Visit (HOSPITAL_COMMUNITY): Payer: Medicare Other

## 2012-11-06 ENCOUNTER — Ambulatory Visit (HOSPITAL_COMMUNITY): Payer: Medicare Other | Admitting: Anesthesiology

## 2012-11-06 ENCOUNTER — Ambulatory Visit (HOSPITAL_COMMUNITY)
Admission: RE | Admit: 2012-11-06 | Discharge: 2012-11-06 | Disposition: A | Discharge status: Home / Self Care | Payer: Medicare Other | Source: Ambulatory Visit | Attending: Neurological Surgery | Admitting: Neurological Surgery

## 2012-11-06 ENCOUNTER — Encounter (HOSPITAL_COMMUNITY)
Admission: RE | Disposition: A | Discharge status: Home / Self Care | Payer: Self-pay | Source: Ambulatory Visit | Attending: Neurological Surgery

## 2012-11-06 DIAGNOSIS — E119 Type 2 diabetes mellitus without complications: Secondary | ICD-10-CM | POA: Diagnosis not present

## 2012-11-06 DIAGNOSIS — Z01812 Encounter for preprocedural laboratory examination: Secondary | ICD-10-CM | POA: Insufficient documentation

## 2012-11-06 DIAGNOSIS — Z79899 Other long term (current) drug therapy: Secondary | ICD-10-CM | POA: Insufficient documentation

## 2012-11-06 DIAGNOSIS — M5126 Other intervertebral disc displacement, lumbar region: Secondary | ICD-10-CM | POA: Insufficient documentation

## 2012-11-06 DIAGNOSIS — M519 Unspecified thoracic, thoracolumbar and lumbosacral intervertebral disc disorder: Secondary | ICD-10-CM | POA: Diagnosis not present

## 2012-11-06 HISTORY — PX: LUMBAR LAMINECTOMY/DECOMPRESSION MICRODISCECTOMY: SHX5026

## 2012-11-06 LAB — GLUCOSE, CAPILLARY
Glucose-Capillary: 124 mg/dL — ABNORMAL HIGH (ref 70–99)
Glucose-Capillary: 150 mg/dL — ABNORMAL HIGH (ref 70–99)

## 2012-11-06 SURGERY — LUMBAR LAMINECTOMY/DECOMPRESSION MICRODISCECTOMY 1 LEVEL
Anesthesia: General | Site: Back | Laterality: Right | Wound class: Clean

## 2012-11-06 MED ORDER — FENTANYL CITRATE 0.05 MG/ML IJ SOLN
INTRAMUSCULAR | Status: DC | PRN
  Administered 2012-11-06: 50 ug via INTRAVENOUS

## 2012-11-06 MED ORDER — MIDAZOLAM HCL 5 MG/5ML IJ SOLN
INTRAMUSCULAR | Status: DC | PRN
  Administered 2012-11-06: 1 mg via INTRAVENOUS

## 2012-11-06 MED ORDER — FENTANYL CITRATE 0.05 MG/ML IJ SOLN
INTRAMUSCULAR | Status: AC
  Filled 2012-11-06: qty 2

## 2012-11-06 MED ORDER — MENTHOL 3 MG MT LOZG: 1.0000 | LOZENGE | OROMUCOSAL | Status: DC | PRN

## 2012-11-06 MED ORDER — THROMBIN 5000 UNITS EX SOLR
CUTANEOUS | Status: DC | PRN
  Administered 2012-11-06 (×2): 5000 [IU] via TOPICAL

## 2012-11-06 MED ORDER — POTASSIUM CHLORIDE IN NACL 20-0.9 MEQ/L-% IV SOLN
INTRAVENOUS | Status: DC
  Filled 2012-11-06 (×2): qty 1000

## 2012-11-06 MED ORDER — EZETIMIBE-SIMVASTATIN 10-80 MG PO TABS
1.0000 | ORAL_TABLET | Freq: Every day | ORAL | Status: DC
  Filled 2012-11-06: qty 1

## 2012-11-06 MED ORDER — OMEPRAZOLE MAGNESIUM 20 MG PO TBEC: 20.0000 mg | DELAYED_RELEASE_TABLET | Freq: Every day | ORAL | Status: DC

## 2012-11-06 MED ORDER — ACETAMINOPHEN 10 MG/ML IV SOLN
1000.0000 mg | Freq: Four times a day (QID) | INTRAVENOUS | Status: DC
  Filled 2012-11-06 (×2): qty 100

## 2012-11-06 MED ORDER — HEMOSTATIC AGENTS (NO CHARGE) OPTIME
TOPICAL | Status: DC | PRN
  Administered 2012-11-06: 1 via TOPICAL

## 2012-11-06 MED ORDER — SODIUM CHLORIDE 0.9 % IJ SOLN: 3.0000 mL | INTRAMUSCULAR | Status: DC | PRN

## 2012-11-06 MED ORDER — OXYCODONE HCL 5 MG PO TABS: 5.0000 mg | ORAL_TABLET | ORAL | Status: DC | PRN

## 2012-11-06 MED ORDER — HYDROMORPHONE HCL PF 1 MG/ML IJ SOLN
INTRAMUSCULAR | Status: AC
  Filled 2012-11-06: qty 1

## 2012-11-06 MED ORDER — ONDANSETRON HCL 4 MG/2ML IJ SOLN: 4.0000 mg | INTRAMUSCULAR | Status: DC | PRN

## 2012-11-06 MED ORDER — ARTIFICIAL TEARS OP OINT
TOPICAL_OINTMENT | OPHTHALMIC | Status: DC | PRN
  Administered 2012-11-06: 1 via OPHTHALMIC

## 2012-11-06 MED ORDER — LIDOCAINE HCL 4 % MT SOLN
OROMUCOSAL | Status: DC | PRN
  Administered 2012-11-06: 4 mL via TOPICAL

## 2012-11-06 MED ORDER — FENTANYL CITRATE 0.05 MG/ML IJ SOLN
INTRAMUSCULAR | Status: DC | PRN
  Administered 2012-11-06: 150 ug via INTRAVENOUS

## 2012-11-06 MED ORDER — PHENOL 1.4 % MT LIQD: 1.0000 | OROMUCOSAL | Status: DC | PRN

## 2012-11-06 MED ORDER — HYDROCODONE-ACETAMINOPHEN 7.5-325 MG PO TABS: 1.0000 | ORAL_TABLET | ORAL | Status: DC | PRN

## 2012-11-06 MED ORDER — GLYCOPYRROLATE 0.2 MG/ML IJ SOLN
INTRAMUSCULAR | Status: DC | PRN
  Administered 2012-11-06: 0.4 mg via INTRAVENOUS

## 2012-11-06 MED ORDER — BACITRACIN 50000 UNITS IM SOLR
INTRAMUSCULAR | Status: AC
  Filled 2012-11-06: qty 1

## 2012-11-06 MED ORDER — LIDOCAINE HCL (CARDIAC) 20 MG/ML IV SOLN
INTRAVENOUS | Status: DC | PRN
  Administered 2012-11-06: 80 mg via INTRAVENOUS

## 2012-11-06 MED ORDER — NEOSTIGMINE METHYLSULFATE 1 MG/ML IJ SOLN
INTRAMUSCULAR | Status: DC | PRN
  Administered 2012-11-06: 3 mg via INTRAVENOUS

## 2012-11-06 MED ORDER — ACETAMINOPHEN 325 MG PO TABS: 650.0000 mg | ORAL_TABLET | ORAL | Status: DC | PRN

## 2012-11-06 MED ORDER — HYDROCODONE-ACETAMINOPHEN 7.5-325 MG PO TABS
1.0000 | ORAL_TABLET | Freq: Four times a day (QID) | ORAL | Status: DC | PRN
  Administered 2012-11-06: 1 via ORAL
  Filled 2012-11-06: qty 1

## 2012-11-06 MED ORDER — PROPOFOL 10 MG/ML IV BOLUS
INTRAVENOUS | Status: DC | PRN
  Administered 2012-11-06: 200 mg via INTRAVENOUS

## 2012-11-06 MED ORDER — ESTRADIOL 1 MG PO TABS
0.5000 mg | ORAL_TABLET | Freq: Every day | ORAL | Status: DC
  Filled 2012-11-06: qty 0.5

## 2012-11-06 MED ORDER — ONDANSETRON HCL 4 MG/2ML IJ SOLN: 4.0000 mg | Freq: Once | INTRAMUSCULAR | Status: DC | PRN

## 2012-11-06 MED ORDER — LACTATED RINGERS IV SOLN
INTRAVENOUS | Status: DC | PRN
  Administered 2012-11-06 (×2): via INTRAVENOUS

## 2012-11-06 MED ORDER — DEXAMETHASONE SODIUM PHOSPHATE 10 MG/ML IJ SOLN: 10.0000 mg | INTRAMUSCULAR | Status: DC

## 2012-11-06 MED ORDER — DEXAMETHASONE SODIUM PHOSPHATE 10 MG/ML IJ SOLN
INTRAMUSCULAR | Status: AC
  Administered 2012-11-06: 10 mg via INTRAVENOUS
  Filled 2012-11-06: qty 1

## 2012-11-06 MED ORDER — METFORMIN HCL 500 MG PO TABS
1000.0000 mg | ORAL_TABLET | Freq: Two times a day (BID) | ORAL | Status: DC
Start: 2012-11-06 — End: 2012-11-06
  Filled 2012-11-06 (×2): qty 2

## 2012-11-06 MED ORDER — ROCURONIUM BROMIDE 100 MG/10ML IV SOLN
INTRAVENOUS | Status: DC | PRN
  Administered 2012-11-06: 30 mg via INTRAVENOUS

## 2012-11-06 MED ORDER — PANTOPRAZOLE SODIUM 40 MG PO TBEC: 40.0000 mg | DELAYED_RELEASE_TABLET | Freq: Every day | ORAL | Status: DC

## 2012-11-06 MED ORDER — BUPIVACAINE HCL (PF) 0.25 % IJ SOLN
INTRAMUSCULAR | Status: DC | PRN
  Administered 2012-11-06: 4 mL

## 2012-11-06 MED ORDER — METHYLPREDNISOLONE ACETATE 80 MG/ML IJ SUSP
INTRAMUSCULAR | Status: DC | PRN
  Administered 2012-11-06: 80 mg

## 2012-11-06 MED ORDER — ONDANSETRON HCL 4 MG/2ML IJ SOLN
INTRAMUSCULAR | Status: DC | PRN
  Administered 2012-11-06: 4 mg via INTRAVENOUS

## 2012-11-06 MED ORDER — ACETAMINOPHEN 650 MG RE SUPP: 650.0000 mg | RECTAL | Status: DC | PRN

## 2012-11-06 MED ORDER — SODIUM CHLORIDE 0.9 % IR SOLN
Status: DC | PRN
  Administered 2012-11-06: 09:00:00

## 2012-11-06 MED ORDER — HYDROMORPHONE HCL PF 1 MG/ML IJ SOLN
0.2500 mg | INTRAMUSCULAR | Status: DC | PRN
  Administered 2012-11-06 (×2): 0.5 mg via INTRAVENOUS

## 2012-11-06 MED ORDER — CEFAZOLIN SODIUM 1-5 GM-% IV SOLN
1.0000 g | Freq: Three times a day (TID) | INTRAVENOUS | Status: DC
  Filled 2012-11-06 (×2): qty 50

## 2012-11-06 MED ORDER — MORPHINE SULFATE 2 MG/ML IJ SOLN: 1.0000 mg | INTRAMUSCULAR | Status: DC | PRN

## 2012-11-06 MED ORDER — 0.9 % SODIUM CHLORIDE (POUR BTL) OPTIME
TOPICAL | Status: DC | PRN
  Administered 2012-11-06: 1000 mL

## 2012-11-06 MED ORDER — CYCLOBENZAPRINE HCL 10 MG PO TABS: 10.0000 mg | ORAL_TABLET | Freq: Three times a day (TID) | ORAL | Status: DC | PRN

## 2012-11-06 MED ORDER — CEFAZOLIN SODIUM-DEXTROSE 2-3 GM-% IV SOLR
INTRAVENOUS | Status: AC
  Administered 2012-11-06: 2 g via INTRAVENOUS
  Filled 2012-11-06: qty 50

## 2012-11-06 MED ORDER — SODIUM CHLORIDE 0.9 % IV SOLN
INTRAVENOUS | Status: AC
  Filled 2012-11-06: qty 500

## 2012-11-06 MED ORDER — SODIUM CHLORIDE 0.9 % IJ SOLN: 3.0000 mL | Freq: Two times a day (BID) | INTRAMUSCULAR | Status: DC

## 2012-11-06 MED ORDER — ACETAMINOPHEN 10 MG/ML IV SOLN
INTRAVENOUS | Status: AC
  Administered 2012-11-06: 1000 mg via INTRAVENOUS
  Filled 2012-11-06: qty 100

## 2012-11-06 SURGICAL SUPPLY — 49 items
BAG DECANTER FOR FLEXI CONT (MISCELLANEOUS) ×2 IMPLANT
BENZOIN TINCTURE PRP APPL 2/3 (GAUZE/BANDAGES/DRESSINGS) ×2 IMPLANT
BUR MATCHSTICK NEURO 3.0 LAGG (BURR) ×2 IMPLANT
CANISTER SUCTION 2500CC (MISCELLANEOUS) ×2 IMPLANT
CLOTH BEACON ORANGE TIMEOUT ST (SAFETY) ×2 IMPLANT
CONT SPEC 4OZ CLIKSEAL STRL BL (MISCELLANEOUS) ×2 IMPLANT
DRAPE LAPAROTOMY 100X72X124 (DRAPES) ×2 IMPLANT
DRAPE MICROSCOPE LEICA (MISCELLANEOUS) ×2 IMPLANT
DRAPE MICROSCOPE ZEISS OPMI (DRAPES) IMPLANT
DRAPE POUCH INSTRU U-SHP 10X18 (DRAPES) ×2 IMPLANT
DRAPE SURG 17X23 STRL (DRAPES) ×2 IMPLANT
DRESSING TELFA 8X3 (GAUZE/BANDAGES/DRESSINGS) ×2 IMPLANT
DRSG OPSITE 4X5.5 SM (GAUZE/BANDAGES/DRESSINGS) ×2 IMPLANT
DURAPREP 26ML APPLICATOR (WOUND CARE) ×2 IMPLANT
ELECT REM PT RETURN 9FT ADLT (ELECTROSURGICAL) ×2
ELECTRODE REM PT RTRN 9FT ADLT (ELECTROSURGICAL) ×1 IMPLANT
GAUZE SPONGE 4X4 16PLY XRAY LF (GAUZE/BANDAGES/DRESSINGS) IMPLANT
GLOVE BIO SURGEON STRL SZ8 (GLOVE) ×2 IMPLANT
GLOVE BIOGEL PI IND STRL 7.0 (GLOVE) ×2 IMPLANT
GLOVE BIOGEL PI IND STRL 8.5 (GLOVE) ×1 IMPLANT
GLOVE BIOGEL PI INDICATOR 7.0 (GLOVE) ×2
GLOVE BIOGEL PI INDICATOR 8.5 (GLOVE) ×1
GLOVE ECLIPSE 8.5 STRL (GLOVE) ×2 IMPLANT
GLOVE SS BIOGEL STRL SZ 6.5 (GLOVE) ×2 IMPLANT
GLOVE SUPERSENSE BIOGEL SZ 6.5 (GLOVE) ×2
GOWN BRE IMP SLV AUR LG STRL (GOWN DISPOSABLE) ×2 IMPLANT
GOWN BRE IMP SLV AUR XL STRL (GOWN DISPOSABLE) ×4 IMPLANT
GOWN STRL REIN 2XL LVL4 (GOWN DISPOSABLE) IMPLANT
HEMOSTAT POWDER KIT SURGIFOAM (HEMOSTASIS) IMPLANT
KIT BASIN OR (CUSTOM PROCEDURE TRAY) ×2 IMPLANT
KIT ROOM TURNOVER OR (KITS) ×2 IMPLANT
NEEDLE BLUNT 18X1 FOR OR ONLY (NEEDLE) ×2 IMPLANT
NEEDLE HYPO 25X1 1.5 SAFETY (NEEDLE) ×2 IMPLANT
NEEDLE SPNL 20GX3.5 QUINCKE YW (NEEDLE) ×2 IMPLANT
NS IRRIG 1000ML POUR BTL (IV SOLUTION) ×2 IMPLANT
PACK LAMINECTOMY NEURO (CUSTOM PROCEDURE TRAY) ×2 IMPLANT
PAD ARMBOARD 7.5X6 YLW CONV (MISCELLANEOUS) ×10 IMPLANT
RUBBERBAND STERILE (MISCELLANEOUS) ×4 IMPLANT
SPONGE SURGIFOAM ABS GEL SZ50 (HEMOSTASIS) ×2 IMPLANT
STRIP CLOSURE SKIN 1/2X4 (GAUZE/BANDAGES/DRESSINGS) ×2 IMPLANT
SUT VIC AB 0 CT1 18XCR BRD8 (SUTURE) ×1 IMPLANT
SUT VIC AB 0 CT1 8-18 (SUTURE) ×1
SUT VIC AB 2-0 CP2 18 (SUTURE) ×2 IMPLANT
SUT VIC AB 3-0 SH 8-18 (SUTURE) ×2 IMPLANT
SYR 20ML ECCENTRIC (SYRINGE) ×2 IMPLANT
SYR 5ML LL (SYRINGE) ×2 IMPLANT
TOWEL OR 17X24 6PK STRL BLUE (TOWEL DISPOSABLE) ×2 IMPLANT
TOWEL OR 17X26 10 PK STRL BLUE (TOWEL DISPOSABLE) ×2 IMPLANT
WATER STERILE IRR 1000ML POUR (IV SOLUTION) ×2 IMPLANT

## 2012-11-06 NOTE — Anesthesia Postprocedure Evaluation (Signed)
  Anesthesia Post-op Note  Patient: Sarah Velasquez  Procedure(s) Performed: Procedure(s) with comments: LUMBAR LAMINECTOMY/DECOMPRESSION MICRODISCECTOMY 1 LEVEL (Right) - Right lumbar three-four extraforaminal diskectomy  Patient Location: PACU  Anesthesia Type:General  Level of Consciousness: awake, alert , oriented and patient cooperative  Airway and Oxygen Therapy: Patient Spontanous Breathing  Post-op Pain: mild  Post-op Assessment: Post-op Vital signs reviewed, Patient's Cardiovascular Status Stable, Respiratory Function Stable, Patent Airway, No signs of Nausea or vomiting and Pain level controlled  Post-op Vital Signs: stable  Complications: No apparent anesthesia complications

## 2012-11-06 NOTE — Op Note (Signed)
11/06/2012  9:33 AM  PATIENT:  Sarah Velasquez  67 y.o. female  PRE-OPERATIVE DIAGNOSIS:  Right L3-4 extraforaminal herniated nucleus pulposus with right L3 radiculopathy  POST-OPERATIVE DIAGNOSIS:  Same  PROCEDURE:  Right L3-4 extra foraminal microdiscectomy utilizing microscopic dissection  SURGEON:  Marikay Alar, MD  ASSISTANTS: Dr. Danielle Dess  ANESTHESIA:   General  EBL: 20 ml  Total I/O In: 1000 [I.V.:1000] Out: 50 [Blood:50]  BLOOD ADMINISTERED:none  DRAINS: None   SPECIMEN:  No Specimen  INDICATION FOR PROCEDURE: This patient presented with severe right quadricep pain with numbness and tingling. MRI showed an extra foraminal disc herniation L3-4 on the right. She tried medical management without relief. Recommended a right L3-4 extraforaminal discectomy. Patient understood the risks, benefits, and alternatives and potential outcomes and wished to proceed.  PROCEDURE DETAILS: The patient was taken to the operating room and after induction of adequate generalized endotracheal anesthesia, the patient was rolled into the prone position on the Wilson frame and all pressure points were padded. The lumbar region was cleaned and then prepped with DuraPrep and draped in the usual sterile fashion. 5 cc of local anesthesia was injected and then a dorsal midline incision was made and carried down to the lumbar fascia. The fascia was opened and the paraspinous musculature was taken down in a subperiosteal fashion to expose the L3-4 extraforaminal space on the right. Intraoperative x-ray confirmed my level, and then I used a combination of the high-speed drill and the Kerrison punches to drill the lateral edge of the pars and superior part of the facet at L3-4 on the right. The underlying yellow ligament was opened and removed in a piecemeal fashion to expose the underlying exiting L3 nerve root. The nerve root was well decompressed. I found a large extraforaminal disc protrusion just inferior to and  underneath the L3 nerve root. This was incised and then removed with I nerve hook. Arch amounts of disc were removed. I then incised the annulus and performed intradiscal discectomy. I then palpated with a coronary dilator along the nerve root and into the foramen to assure adequate decompression. I felt no more compression of the nerve root. Dr. Danielle Dess also confirmed this with palpation with a coronary dilator and assisted with a discectomy. I irrigated with saline solution containing bacitracin. Achieved hemostasis with bipolar cautery, lined the dura with Gelfoam, and then closed the fascia with 0 Vicryl. I closed the subcutaneous tissues with 2-0 Vicryl and the subcuticular tissues with 3-0 Vicryl. The skin was then closed with benzoin and Steri-Strips. The drapes were removed, a sterile dressing was applied. The patient was awakened from general anesthesia and transferred to the recovery room in stable condition. At the end of the procedure all sponge, needle and instrument counts were correct.   PLAN OF CARE: Admit for overnight observation  PATIENT DISPOSITION:  PACU - hemodynamically stable.   Delay start of Pharmacological VTE agent (>24hrs) due to surgical blood loss or risk of bleeding:  yes

## 2012-11-06 NOTE — H&P (Signed)
Subjective: Patient is a 67 y.o. female admitted for R L3-4 extraforaminal diskectomy. Onset of symptoms was a few months ago, gradually worsening since that time.  The pain is rated severe, and is located at the across the lower back and radiates to R quad. The pain is described as aching and occurs intermittently. The symptoms have been progressive. Symptoms are exacerbated by exercise. MRI or CT showed R L3-4 HNP   Past Medical History  Diagnosis Date  . Diabetes mellitus     typeII  . Hyperlipidemia 05/1995  . Diverticulosis of colon 09/2003    colonoscopy ext.and internal hemorrhoids (Dr. Mechele Collin )12/03/2006// colonoscopy polyps multiple  benign; interal hemorrhoids ,divertics 09/16/2003  . GERD (gastroesophageal reflux disease)     EGD hiatal hernia; esophagitis/sigmoidoscopy WNL (Dr. Mechele Collin) 03/04/1996: ABD. U/S  WNL (06/1996)//EGD multiple polyps ;small H.H (09/16/2003)  . Chest pain 02/15-16/2009    Hospital [facility]  chest pain rule out //stress Myoview negative 10/21/2007//Stress cardiolite ;apical thinning ,negative ischemia (09/14/2003)  . Osteopenia 12/2011    spine -1.8, hip -0.7  . Right lumbar radiculitis 2013    DDD, spondylosis, L3 radiculitis (MRI 07/2012) - ESI 09/2011 (Dr Yves Dill)   . H/O hiatal hernia   . PONV (postoperative nausea and vomiting)   . Vertigo     hx of  . Environmental allergies   . Drug-induced constipation     Past Surgical History  Procedure Laterality Date  . Abdominal hysterectomy  1986    ovaries intact Uterine prolapse  . Nasal sinus surgery  1978  . Vaginal delivery      NSVD x 2  . Tubal ligation    . Dexa  12/2011    spine -1.8, hip -0.7  . Esi  09/2011    ([place], [place])  . Colonoscopy w/ polypectomy    . Hand surgery  01/23/2002     Three fingers amputated.  Lawnmower   accident.    Prior to Admission medications   Medication Sig Start Date End Date Taking? Authorizing Provider  aspirin 81 MG tablet Take 81 mg by mouth daily.    Yes Historical Provider, MD  cetirizine (ZYRTEC ALLERGY) 10 MG tablet Take 10 mg by mouth daily.     Yes Historical Provider, MD  cholecalciferol (VITAMIN D) 1000 UNITS tablet Take 1,000 Units by mouth daily.    Yes Historical Provider, MD  docusate sodium (COLACE) 100 MG capsule Take 200 mg by mouth daily.    Yes Historical Provider, MD  estradiol (ESTRACE) 1 MG tablet Take 0.5 mg by mouth daily.   Yes Historical Provider, MD  glucose blood (ONE TOUCH ULTRA TEST) test strip 1 each by Other route daily. Use as instructed to check sugars daily.  250.00 10/15/12  Yes Eustaquio Boyden, MD  HYDROcodone-acetaminophen (NORCO) 7.5-325 MG per tablet Take 1 tablet by mouth every 6 (six) hours as needed for pain.   Yes Historical Provider, MD  ibuprofen (ADVIL,MOTRIN) 200 MG tablet Take 400 mg by mouth every 6 (six) hours as needed for pain.   Yes Historical Provider, MD  metFORMIN (GLUCOPHAGE) 1000 MG tablet Take 1,000 mg by mouth 2 (two) times daily with a meal.   Yes Historical Provider, MD  Multiple Vitamin (MULTIVITAMIN) tablet Take 1 tablet by mouth daily.   Yes Historical Provider, MD  omeprazole (PRILOSEC OTC) 20 MG tablet Take 20 mg by mouth daily.     Yes Historical Provider, MD  [facility] DELICA LANCETS 33G MISC USE AS DIRECTED TWICE A  DAY 10/03/12  Yes Eustaquio Boyden, MD  oxyCODONE-acetaminophen (PERCOCET) 7.5-325 MG per tablet Take 1 tablet by mouth every 6 (six) hours as needed.   Yes Historical Provider, MD  polyethylene glycol (MIRALAX / GLYCOLAX) packet Take 17 g by mouth daily.   Yes Historical Provider, MD  ranitidine (ZANTAC) 150 MG tablet Take 150 mg by mouth at bedtime.     Yes Historical Provider, MD  VYTORIN 10-80 MG per tablet TAKE 1 TABLET BY MOUTH AT BEDTIME. 05/04/12  Yes Eustaquio Boyden, MD   Allergies  Allergen Reactions  . Shellfish Allergy Anaphylaxis  . Azithromycin     REACTION: rash  . Meperidine Hcl     REACTION: nausea  . Neurontin (Gabapentin) Other (See Comments)     Incoherent  . Omeprazole     REACTION: rash ([facility] brand)    History  Substance Use Topics  . Smoking status: Never Smoker   . Smokeless tobacco: Never Used  . Alcohol Use: 0.0 oz/week    0 drink(s) per week     Comment: rarely - wine    Family History  Problem Relation Age of Onset  . Heart disease Mother 60    MI  . COPD Father     emphysema  . Cancer Father     esophageal and brain cancer  . Heart disease Father 78    CABG  . Alcohol abuse Sister 19    etoh  . Heart disease Brother 70    MI     Review of Systems  Positive ROS: neg  All other systems have been reviewed and were otherwise negative with the exception of those mentioned in the HPI and as above.  Objective: Vital signs in last 24 hours: Temp:  [97.3 F (36.3 C)] 97.3 F (36.3 C) (03/05 4782) Pulse Rate:  [86] 86 (03/05 0632) Resp:  [18] 18 (03/05 0632) BP: (126)/(77) 126/77 mmHg (03/05 0632) SpO2:  [97 %] 97 % (03/05 9562)  General Appearance: Alert, cooperative, no distress, appears stated age Head: Normocephalic, without obvious abnormality, atraumatic Eyes: PERRL, conjunctiva/corneas clear, EOM's intact, fundi benign, both eyes      Ears: Normal TM's and external ear canals, both ears Throat: Lips, mucosa, and tongue normal; teeth and gums normal Neck: Supple, symmetrical, trachea midline, no adenopathy; thyroid: No enlargement/tenderness/nodules; no carotid bruit or JVD Back: Symmetric, no curvature, ROM normal, no CVA tenderness Lungs: Clear to auscultation bilaterally, respirations unlabored Heart: Regular rate and rhythm, S1 and S2 normal, no murmur, rub or gallop Abdomen: Soft, non-tender, bowel sounds active all four quadrants, no masses, no organomegaly Extremities: Extremities normal, atraumatic, no cyanosis or edema Pulses: 2+ and symmetric all extremities Skin: Skin color, texture, turgor normal, no rashes or lesions  NEUROLOGIC:   Mental status: Alert and oriented x4,  no  aphasia, good attention span, fund of knowledge, and memory Motor Exam - grossly normal Sensory Exam - grossly normal Reflexes: 1+ Coordination - grossly normal Gait - grossly normal Balance - grossly normal Cranial Nerves: I: smell Not tested  II: visual acuity  OS: nl    OD: nl  II: visual fields Full to confrontation  II: pupils Equal, round, reactive to light  III,VII: ptosis None  III,IV,VI: extraocular muscles  Full ROM  V: mastication Normal  V: facial light touch sensation  Normal  V,VII: corneal reflex  Present  VII: facial muscle function - upper  Normal  VII: facial muscle function - lower Normal  VIII: hearing Not tested  IX: soft palate elevation  Normal  IX,X: gag reflex Present  XI: trapezius strength  5/5  XI: sternocleidomastoid strength 5/5  XI: neck flexion strength  5/5  XII: tongue strength  Normal    Data Review Lab Results  Component Value Date   WBC 11.9* 11/01/2012   HGB 14.0 11/01/2012   HCT 40.5 11/01/2012   MCV 86.5 11/01/2012   PLT 258 11/01/2012   Lab Results  Component Value Date   NA 143 11/01/2012   K 4.7 11/01/2012   CL 105 11/01/2012   CO2 26 11/01/2012   BUN 16 11/01/2012   CREATININE 0.82 11/01/2012   GLUCOSE 105* 11/01/2012   Lab Results  Component Value Date   INR 0.99 10/14/2012    Assessment/Plan: Patient admitted for R L3-4 extra-foraminal microdiskectomy. Patient has failed conservative therapy.  I explained the condition and procedure to the patient and answered any questions.  Patient wishes to proceed with procedure as planned. Understands risks/ benefits and typical outcomes of procedure.   JONES,DAVID S 11/06/2012 8:30 AM

## 2012-11-06 NOTE — Preoperative (Signed)
Beta Blockers   Reason not to administer Beta Blockers:Not Applicable 

## 2012-11-06 NOTE — Transfer of Care (Signed)
Immediate Anesthesia Transfer of Care Note  Patient: Sarah Velasquez  Procedure(s) Performed: Procedure(s) with comments: LUMBAR LAMINECTOMY/DECOMPRESSION MICRODISCECTOMY 1 LEVEL (Right) - Right lumbar three-four extraforaminal diskectomy  Patient Location: PACU  Anesthesia Type:General  Level of Consciousness: awake, alert , oriented and patient cooperative  Airway & Oxygen Therapy: Patient Spontanous Breathing and Patient connected to nasal cannula oxygen  Post-op Assessment: Report given to PACU RN, Post -op Vital signs reviewed and stable and Patient moving all extremities  Post vital signs: Reviewed and stable  Complications: No apparent anesthesia complications

## 2012-11-06 NOTE — Anesthesia Preprocedure Evaluation (Addendum)
Anesthesia Evaluation  Patient identified by MRN, date of birth, ID band Patient awake    Reviewed: Allergy & Precautions, H&P , NPO status , Patient's Chart, lab work & pertinent test results, reviewed documented beta blocker date and time   History of Anesthesia Complications (+) PONV  Airway Mallampati: I TM Distance: >3 FB Neck ROM: full    Dental  (+) Dental Advisory Given   Pulmonary          Cardiovascular Rhythm:regular Rate:Normal     Neuro/Psych  Neuromuscular disease    GI/Hepatic hiatal hernia, GERD-  ,  Endo/Other  diabetes  Renal/GU      Musculoskeletal   Abdominal   Peds  Hematology   Anesthesia Other Findings   Reproductive/Obstetrics                          Anesthesia Physical Anesthesia Plan  ASA: II  Anesthesia Plan: General   Post-op Pain Management:    Induction: Intravenous  Airway Management Planned: Oral ETT  Additional Equipment:   Intra-op Plan:   Post-operative Plan: Extubation in OR  Informed Consent: I have reviewed the patients History and Physical, chart, labs and discussed the procedure including the risks, benefits and alternatives for the proposed anesthesia with the patient or authorized representative who has indicated his/her understanding and acceptance.   Dental advisory given  Plan Discussed with: CRNA, Anesthesiologist and Surgeon  Anesthesia Plan Comments:        Anesthesia Quick Evaluation

## 2012-11-06 NOTE — Discharge Summary (Signed)
Physician Discharge Summary  Patient ID: Sarah Velasquez MRN: [id number] DOB/AGE: 1946/03/17 67 y.o.  Admit date: 11/06/2012 Discharge date: 11/06/2012  Admission Diagnoses: HNP L3-4    Discharge Diagnoses: same   Discharged Condition: good  Hospital Course: The patient was admitted on 11/06/2012 and taken to the operating room where the patient underwent right L3-4 microdiskectomy. The patient tolerated the procedure well and was taken to the recovery room and then to the floor in stable condition. The hospital course was routine. There were no complications. The wound remained clean dry and intact. Pt had appropriate back soreness. No complaints of leg pain or new N/T/W. The patient remained afebrile with stable vital signs, and tolerated a regular diet. The patient continued to increase activities, and pain was well controlled with oral pain medications.   Consults: None  Significant Diagnostic Studies:  Results for orders placed during the hospital encounter of 11/06/12  GLUCOSE, CAPILLARY      Result Value Range   Glucose-Capillary 124 (*) 70 - 99 mg/dL  GLUCOSE, CAPILLARY      Result Value Range   Glucose-Capillary 150 (*) 70 - 99 mg/dL    Chest 2 View  12/11/8117  *RADIOLOGY REPORT*  Clinical Data: Preop for laminectomy.  Lumbar HNP.  CHEST - 2 VIEW  Comparison: None.  Findings: The heart size is normal.  The lungs are clear. Degenerative changes are present in the thoracic spine.  IMPRESSION:  1.  No acute cardiopulmonary disease.   Original Report Authenticated By: Marin Roberts, M.D.    Dg Lumbar Spine 2-3 Views  11/06/2012  *RADIOLOGY REPORT*  Clinical Data: L3 - L4 microdiskectomy  LUMBAR SPINE - 2-3 VIEW  Comparison: Lumbar spine radiographs - 06/11/2012  Findings:  Lumbar spine labeling is in keeping with preprocedural lumbar spine radiographs performed 06/11/2012.  Two spot lateral intraoperative radiographic images of the lumbar spine provided for review.  Radiograph  labeled #1 demonstrates a radiopaque marking needle tip overlying the soft tissues posterior to the L2 vertebral body.  Radiograph labeled #3 demonstrates a radiopaque surgical instrument tip overlying the soft tissues posterior to the L3 - L4 intervertebral disc space.  Additional radiopaque support apparatus is seen overlying the soft tissues posterior to this spinal level.  IMPRESSION: Intraoperative spinal localization as above.   Original Report Authenticated By: Tacey Ruiz, MD     Antibiotics:  Anti-infectives   Start     Dose/Rate Route Frequency Ordered Stop   11/06/12 1100  ceFAZolin (ANCEF) IVPB 1 g/50 mL premix     1 g 100 mL/hr over 30 Minutes Intravenous Every 8 hours 11/06/12 1059 11/07/12 0259   11/06/12 0915  bacitracin 50,000 Units in sodium chloride irrigation 0.9 % 500 mL irrigation  Status:  Discontinued       As needed 11/06/12 0915 11/06/12 0939   11/06/12 0831  ceFAZolin (ANCEF) 2-3 GM-% IVPB SOLR    Comments:  MUMM,VALERIE: cabinet override      11/06/12 0831 11/06/12 0841   11/06/12 0801  bacitracin 14782 UNITS injection    Comments:  DAY, DORY: cabinet override      11/06/12 0801 11/06/12 2014      Discharge Exam: Blood pressure 110/68, pulse 72, temperature 97.5 F (36.4 C), temperature source Oral, resp. rate 18, SpO2 93.00%. Neurologic: Grossly normal Incision CDI  Discharge Medications:     Medication List    TAKE these medications       aspirin 81 MG tablet  Take 81 mg  by mouth daily.     cholecalciferol 1000 UNITS tablet  Commonly known as:  VITAMIN D  Take 1,000 Units by mouth daily.     docusate sodium 100 MG capsule  Commonly known as:  COLACE  Take 200 mg by mouth daily.     estradiol 1 MG tablet  Commonly known as:  ESTRACE  Take 0.5 mg by mouth daily.     glucose blood test strip  Commonly known as:  ONE TOUCH ULTRA TEST  1 each by Other route daily. Use as instructed to check sugars daily.  250.00      HYDROcodone-acetaminophen 7.5-325 MG per tablet  Commonly known as:  NORCO  Take 1 tablet by mouth every 4 (four) hours as needed for pain.     ibuprofen 200 MG tablet  Commonly known as:  ADVIL,MOTRIN  Take 400 mg by mouth every 6 (six) hours as needed for pain.     metFORMIN 1000 MG tablet  Commonly known as:  GLUCOPHAGE  Take 1,000 mg by mouth 2 (two) times daily with a meal.     multivitamin tablet  Take 1 tablet by mouth daily.     ONETOUCH DELICA LANCETS 33G Misc  USE AS DIRECTED TWICE A DAY     oxyCODONE-acetaminophen 7.5-325 MG per tablet  Commonly known as:  PERCOCET  Take 1 tablet by mouth every 6 (six) hours as needed.     polyethylene glycol packet  Commonly known as:  MIRALAX / GLYCOLAX  Take 17 g by mouth daily.     PRILOSEC OTC 20 MG tablet  Generic drug:  omeprazole  Take 20 mg by mouth daily.     ranitidine 150 MG tablet  Commonly known as:  ZANTAC  Take 150 mg by mouth at bedtime.     VYTORIN 10-80 MG per tablet  Generic drug:  ezetimibe-simvastatin  TAKE 1 TABLET BY MOUTH AT BEDTIME.     ZYRTEC ALLERGY 10 MG tablet  Generic drug:  cetirizine  Take 10 mg by mouth daily.        Disposition: home   Final Dx: Lumbar microdiskectomy      Discharge Orders   Future Appointments Provider Department Dept Phone   03/24/2013 8:30 AM [facility] [phone]   Future Orders Complete By Expires     Call MD for:  difficulty breathing, headache or visual disturbances  As directed     Call MD for:  persistant nausea and vomiting  As directed     Call MD for:  redness, tenderness, or signs of infection (pain, swelling, redness, odor or green/yellow discharge around incision site)  As directed     Call MD for:  severe uncontrolled pain  As directed     Call MD for:  temperature >100.4  As directed     Diet - low sodium heart healthy  As directed     Discharge instructions  As directed     Comments:      No bending,  twisting or lifting. May shower normally.. No driving for 5-7 days.    Increase activity slowly  As directed     Remove dressing in 48 hours  As directed        Follow-up Information   Follow up with JONES,DAVID S, MD In 3 weeks.   Contact information:   [address] [place] [address] [phone]        Signed: Tia Alert 11/06/2012,  2:20 PM

## 2012-11-06 NOTE — Plan of Care (Signed)
Problem: Consults Goal: Diagnosis - Spinal Surgery Outcome: Completed/Met Date Met:  11/06/12 Lumbar Extraforaminal Diskectomy

## 2012-11-06 NOTE — Anesthesia Procedure Notes (Signed)
Procedure Name: Intubation Date/Time: 11/06/2012 8:38 AM Performed by: Jerilee Hoh Pre-anesthesia Checklist: Patient identified, Emergency Drugs available, Suction available and Patient being monitored Patient Re-evaluated:Patient Re-evaluated prior to inductionOxygen Delivery Method: Circle system utilized Preoxygenation: Pre-oxygenation with 100% oxygen Intubation Type: IV induction Ventilation: Mask ventilation without difficulty Laryngoscope Size: Mac and 3 Grade View: Grade I Tube type: Oral Tube size: 7.5 mm Number of attempts: 1 Airway Equipment and Method: Stylet and LTA kit utilized Placement Confirmation: ETT inserted through vocal cords under direct vision,  positive ETCO2 and breath sounds checked- equal and bilateral Secured at: 22 cm Tube secured with: Tape Dental Injury: Teeth and Oropharynx as per pre-operative assessment

## 2012-11-07 ENCOUNTER — Encounter (HOSPITAL_COMMUNITY): Payer: Self-pay | Admitting: Neurological Surgery

## 2012-11-29 ENCOUNTER — Encounter (HOSPITAL_COMMUNITY): Admission: RE | Payer: Self-pay | Source: Ambulatory Visit

## 2012-11-29 ENCOUNTER — Inpatient Hospital Stay (HOSPITAL_COMMUNITY): Admission: RE | Admit: 2012-11-29 | Payer: Medicare Other | Source: Ambulatory Visit | Admitting: Neurological Surgery

## 2012-11-29 SURGERY — LUMBAR LAMINECTOMY/DECOMPRESSION MICRODISCECTOMY 1 LEVEL
Anesthesia: General | Site: Back | Laterality: Right

## 2013-01-02 LAB — HM DIABETES EYE EXAM

## 2013-01-06 ENCOUNTER — Encounter: Payer: Self-pay | Admitting: Family Medicine

## 2013-01-17 DIAGNOSIS — E119 Type 2 diabetes mellitus without complications: Secondary | ICD-10-CM | POA: Diagnosis not present

## 2013-02-05 ENCOUNTER — Encounter: Payer: Self-pay | Admitting: Family Medicine

## 2013-02-05 ENCOUNTER — Ambulatory Visit (INDEPENDENT_AMBULATORY_CARE_PROVIDER_SITE_OTHER): Payer: Medicare Other | Admitting: Family Medicine

## 2013-02-05 VITALS — BP 120/80 | HR 73 | Temp 98.1°F | Wt 153.0 lb

## 2013-02-05 DIAGNOSIS — R040 Epistaxis: Secondary | ICD-10-CM | POA: Insufficient documentation

## 2013-02-05 NOTE — Patient Instructions (Addendum)
Go to the lab on the way out.  We'll contact you with your lab report. Try to avoid blowing your nose in the meantime.  If you have another episode that won't resolve, then go to the ER or call here so we can pack it.  If you have another episode, then we need to set you up with ENT.   Stop the ibuprofen, take tylenol instead.

## 2013-02-05 NOTE — Progress Notes (Signed)
Her back pain is better now, but not fully resolved.  She had been on NSAIDS prev and is taking 81mg  ASA now.  She is still taking 400mg  of ibuprofen BID prn.  Other then the nosebleed, she had no bleeding concerns.   4 nose bleeds in the last 2 weeks.  They would last up to 30 minutes.  The first 3 were after blowing her nose.  4th episode, last night, was spontaneous.  She likely was passing clots last night. All for 4 episodes from the L nostril.    She feels well o/w.  She has a history of allergies and sinus congestion.    Meds, vitals, and allergies reviewed.   ROS: See HPI.  Otherwise, noncontributory.  nad ncat Tm wnl OP wnl Neck supple, no LA Nasal exam slightly stuffy.  She does have an adherent scab on the medial area of the L nostril, on the septum.  No active bleeding.   No bruising

## 2013-02-06 LAB — CBC WITH DIFFERENTIAL/PLATELET
Basophils Relative: 0.3 % (ref 0.0–3.0)
Eosinophils Absolute: 0.2 10*3/uL (ref 0.0–0.7)
Eosinophils Relative: 2.2 % (ref 0.0–5.0)
HCT: 41.3 % (ref 36.0–46.0)
Lymphs Abs: 3.5 10*3/uL (ref 0.7–4.0)
MCHC: 33.1 g/dL (ref 30.0–36.0)
MCV: 91.8 fl (ref 78.0–100.0)
Monocytes Absolute: 0.6 10*3/uL (ref 0.1–1.0)
Neutro Abs: 6.7 10*3/uL (ref 1.4–7.7)
Neutrophils Relative %: 60.3 % (ref 43.0–77.0)
RBC: 4.49 Mil/uL (ref 3.87–5.11)

## 2013-02-06 LAB — PROTIME-INR: INR: 1.1 ratio — ABNORMAL HIGH (ref 0.8–1.0)

## 2013-02-06 NOTE — Assessment & Plan Note (Signed)
Multiple events.  No bleeding now. Site identified.  Nonsmoker.  Would check CBC and INR.  If normal and no more events, then no further w/u.  If continues, will likely need ENT eval.  See notes on labs.  She agrees. Hold nsaids for now. Change to tylenol. Continue 81mg  ASA for now.

## 2013-02-28 ENCOUNTER — Other Ambulatory Visit: Payer: Self-pay | Admitting: *Deleted

## 2013-02-28 MED ORDER — ONETOUCH DELICA LANCETS 33G MISC: 1.0000 | Freq: Every day | Status: DC

## 2013-03-13 ENCOUNTER — Other Ambulatory Visit: Payer: Self-pay

## 2013-03-22 ENCOUNTER — Other Ambulatory Visit: Payer: Self-pay | Admitting: Family Medicine

## 2013-03-22 DIAGNOSIS — E119 Type 2 diabetes mellitus without complications: Secondary | ICD-10-CM

## 2013-03-24 ENCOUNTER — Other Ambulatory Visit: Payer: Medicare Other

## 2013-03-31 ENCOUNTER — Ambulatory Visit (INDEPENDENT_AMBULATORY_CARE_PROVIDER_SITE_OTHER): Payer: Medicare Other | Admitting: Family Medicine

## 2013-03-31 DIAGNOSIS — E119 Type 2 diabetes mellitus without complications: Secondary | ICD-10-CM

## 2013-03-31 NOTE — Progress Notes (Signed)
DNKA

## 2013-04-07 ENCOUNTER — Other Ambulatory Visit: Payer: Self-pay | Admitting: *Deleted

## 2013-04-07 MED ORDER — EZETIMIBE-SIMVASTATIN 10-80 MG PO TABS: ORAL_TABLET | ORAL | Status: DC

## 2013-05-27 ENCOUNTER — Telehealth: Payer: Self-pay

## 2013-05-27 NOTE — Telephone Encounter (Signed)
Pt missed July 2014 appt; pt scheduled fasting lab 06/02/13 at 9:35 am and f/u appt with Dr Reece Agar on 06/05/13 at 3:30 pm.

## 2013-06-02 ENCOUNTER — Other Ambulatory Visit (INDEPENDENT_AMBULATORY_CARE_PROVIDER_SITE_OTHER): Payer: Medicare Other

## 2013-06-02 DIAGNOSIS — E119 Type 2 diabetes mellitus without complications: Secondary | ICD-10-CM | POA: Diagnosis not present

## 2013-06-04 DIAGNOSIS — Z23 Encounter for immunization: Secondary | ICD-10-CM | POA: Diagnosis not present

## 2013-06-05 ENCOUNTER — Encounter: Payer: Self-pay | Admitting: Family Medicine

## 2013-06-05 ENCOUNTER — Ambulatory Visit (INDEPENDENT_AMBULATORY_CARE_PROVIDER_SITE_OTHER): Payer: Medicare Other | Admitting: Family Medicine

## 2013-06-05 VITALS — BP 134/78 | HR 84 | Temp 98.3°F | Wt 154.2 lb

## 2013-06-05 DIAGNOSIS — K219 Gastro-esophageal reflux disease without esophagitis: Secondary | ICD-10-CM

## 2013-06-05 DIAGNOSIS — E119 Type 2 diabetes mellitus without complications: Secondary | ICD-10-CM

## 2013-06-05 DIAGNOSIS — E782 Mixed hyperlipidemia: Secondary | ICD-10-CM | POA: Diagnosis not present

## 2013-06-05 IMAGING — CR DG LUMBAR SPINE 2-3V
1 series · 1 of 1 positions shown · non-contrast
Comparison: Lumbar spine radiographs - 06/11/2012

CLINICAL DATA: L3 - L4 microdiskectomy

LUMBAR SPINE - 2-3 VIEW

[view not recorded]
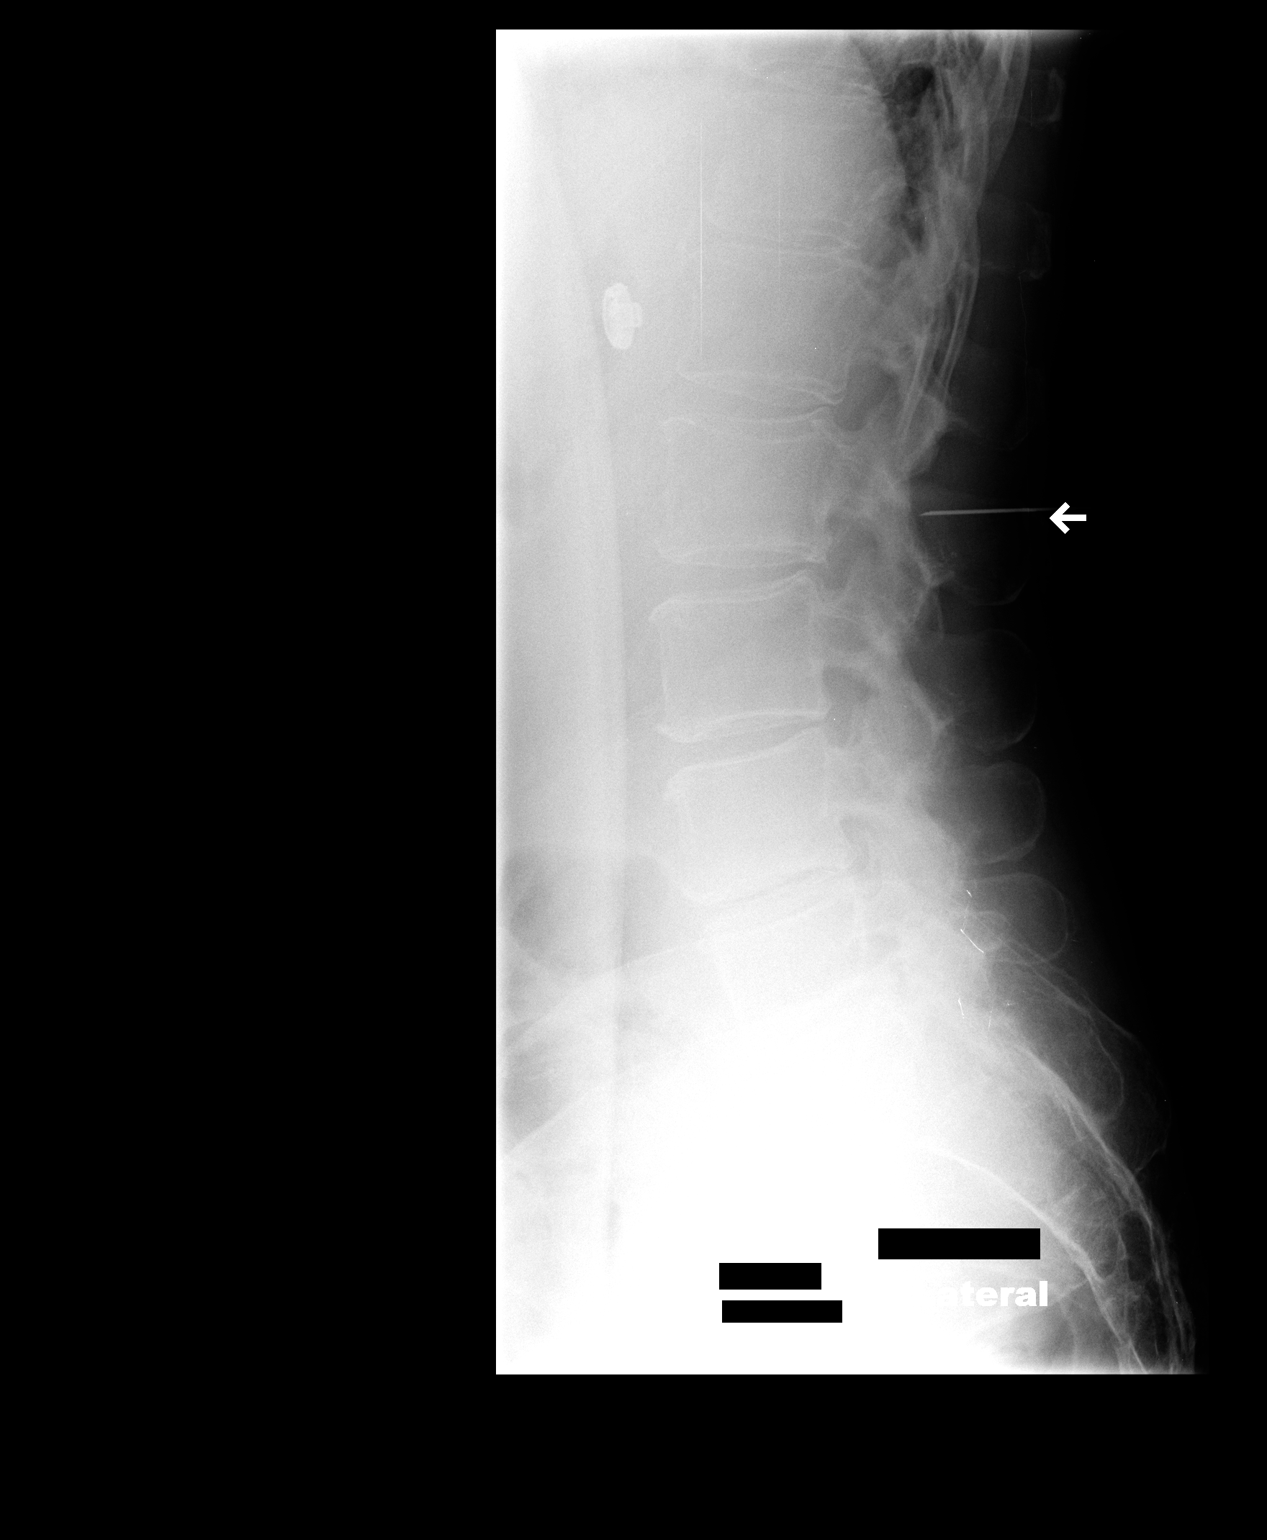

[1 of 1 positions shown; findings below may reference images not displayed]

FINDINGS: Lumbar spine labeling is in keeping with preprocedural lumbar spine
radiographs performed 06/11/2012.

Two spot lateral intraoperative radiographic images of the lumbar
spine provided for review.

Radiograph labeled #1 demonstrates a radiopaque marking needle tip
overlying the soft tissues posterior to the L2 vertebral body.

Radiograph labeled #3 demonstrates a radiopaque surgical instrument
tip overlying the soft tissues posterior to the L3 - L4
intervertebral disc space.  Additional radiopaque support apparatus
is seen overlying the soft tissues posterior to this spinal level.
IMPRESSION: Intraoperative spinal localization as above.

## 2013-06-05 MED ORDER — SIMVASTATIN 80 MG PO TABS: 80.0000 mg | ORAL_TABLET | Freq: Every day | ORAL | Status: DC

## 2013-06-05 MED ORDER — EZETIMIBE 10 MG PO TABS: 10.0000 mg | ORAL_TABLET | Freq: Every day | ORAL | Status: DC

## 2013-06-05 MED ORDER — METFORMIN HCL 1000 MG PO TABS: 1000.0000 mg | ORAL_TABLET | Freq: Two times a day (BID) | ORAL | Status: DC

## 2013-06-05 NOTE — Assessment & Plan Note (Signed)
Chronic. Well controlled on zantac.  No longer on prevacid.

## 2013-06-05 NOTE — Patient Instructions (Signed)
I've sent in separate zetia and simvastatin - price out and start taking. If you start taking, come back in 3 months for lab visit to check cholesterol levels and then in 6 months for office visit Good to see you today, call us with qeustions.

## 2013-06-05 NOTE — Assessment & Plan Note (Signed)
Discussed options - will try separate meds (vytorin). rtc 3 mo for FLP, 6 mo for office visit f/u.

## 2013-06-05 NOTE — Progress Notes (Signed)
Subjective:    Patient ID: Sarah Velasquez, female    DOB: 1946-07-18, 67 y.o.   MRN: [id number]  HPI CC: DM f/u  Accidentally missed last appt.  DM - checks cbgs occasionally to twice daily, and when feeling ill.  Good control at home.  01/2013 was eye exam.  Denies paresthesias.  Foot exam today.  No low sugars or hypoglycemic sxs. Lab Results  Component Value Date   HGBA1C 6.7* 06/02/2013     HLD - prior on lipitor which caused chest pain and heart palpitations, simvastatin alone did not reach goal.  Insurance won't cover crestor or vytorin.    gerd with HH - on zantac bid - helping - works better [employer] of husband with FTD.  "nerves are shot".  Tried buspar in past, didn't like how she felt. Flu shot yesterday.  In midsts of dental work - Comptroller  Past Medical History  Diagnosis Date  . Diabetes mellitus     typeII  . Hyperlipidemia 05/1995  . Diverticulosis of colon 09/2003    colonoscopy ext.and internal hemorrhoids (Dr. Mechele Collin )12/03/2006// colonoscopy polyps multiple  benign; interal hemorrhoids ,divertics 09/16/2003  . GERD (gastroesophageal reflux disease)     EGD hiatal hernia; esophagitis/sigmoidoscopy WNL (Dr. Mechele Collin) 03/04/1996: ABD. U/S  WNL (06/1996)//EGD multiple polyps ;small H.H (09/16/2003)  . Chest pain 02/15-16/2009    Hospital [facility]  chest pain rule out //stress Myoview negative 10/21/2007//Stress cardiolite ;apical thinning ,negative ischemia (09/14/2003)  . Osteopenia 12/2011    spine -1.8, hip -0.7  . Right lumbar radiculitis 2013    DDD, spondylosis, L3 radiculitis (MRI 07/2012) - ESI 09/2011 (Dr Yves Dill)   . H/O hiatal hernia   . PONV (postoperative nausea and vomiting)   . Vertigo     hx of  . Environmental allergies   . Drug-induced constipation     Past Surgical History  Procedure Laterality Date  . Abdominal hysterectomy  1986    ovaries intact Uterine prolapse  . Nasal sinus surgery  1978  . Vaginal delivery     NSVD x 2  . Tubal ligation    . Dexa  12/2011    spine -1.8, hip -0.7  . Esi  09/2011    ([place], [place])  . Colonoscopy w/ polypectomy    . Hand surgery  01/23/2002     Three fingers amputated.  Lawnmower   accident.  . Lumbar laminectomy/decompression microdiscectomy Right 11/06/2012    LUMBAR LAMINECTOMY/DECOMPRESSION MICRODISCECTOMY 1 LEVEL;  Surgeon: Tia Alert, MD;  Right lumbar three-four extraforaminal diskectomy   Review of Systems Per HPI    Objective:   Physical Exam  Nursing note and vitals reviewed. Constitutional: She appears well-developed and well-nourished. No distress.  HENT:  Head: Normocephalic and atraumatic.  Mouth/Throat: Oropharynx is clear and moist. No oropharyngeal exudate.  Eyes: Conjunctivae and EOM are normal. Pupils are equal, round, and reactive to light. No scleral icterus.  Neck: Normal range of motion. Neck supple.  Cardiovascular: Normal rate, regular rhythm, normal heart sounds and intact distal pulses.   No murmur heard. Pulmonary/Chest: Effort normal and breath sounds normal. No respiratory distress. She has no wheezes. She has no rales.  Musculoskeletal: She exhibits no edema.  Diabetic foot exam: Normal inspection No skin breakdown Calluses of right great toe Normal DP/PT pulses Normal sensation to light tough and monofilament Nails normal  Lymphadenopathy:    She has no cervical adenopathy.  Skin: Skin is warm and dry. No rash  noted.  Psychiatric: She has a normal mood and affect.       Assessment & Plan:

## 2013-06-05 NOTE — Assessment & Plan Note (Signed)
Chronic, controlled. Continue metformin. Foot exam today.

## 2013-06-18 ENCOUNTER — Ambulatory Visit: Payer: Self-pay | Admitting: Obstetrics and Gynecology

## 2013-06-18 DIAGNOSIS — Z1231 Encounter for screening mammogram for malignant neoplasm of breast: Secondary | ICD-10-CM | POA: Diagnosis not present

## 2013-06-18 DIAGNOSIS — R922 Inconclusive mammogram: Secondary | ICD-10-CM | POA: Diagnosis not present

## 2013-07-09 ENCOUNTER — Other Ambulatory Visit: Payer: Self-pay | Admitting: *Deleted

## 2013-07-09 MED ORDER — EZETIMIBE-SIMVASTATIN 10-80 MG PO TABS: 1.0000 | ORAL_TABLET | Freq: Every day | ORAL | Status: DC

## 2013-07-15 ENCOUNTER — Ambulatory Visit: Payer: Self-pay | Admitting: Obstetrics and Gynecology

## 2013-07-15 DIAGNOSIS — R928 Other abnormal and inconclusive findings on diagnostic imaging of breast: Secondary | ICD-10-CM | POA: Diagnosis not present

## 2013-07-15 DIAGNOSIS — N6459 Other signs and symptoms in breast: Secondary | ICD-10-CM | POA: Diagnosis not present

## 2013-09-06 ENCOUNTER — Other Ambulatory Visit: Payer: Self-pay | Admitting: Family Medicine

## 2013-09-06 DIAGNOSIS — E782 Mixed hyperlipidemia: Secondary | ICD-10-CM

## 2013-09-15 ENCOUNTER — Telehealth: Payer: Self-pay

## 2013-09-15 NOTE — Telephone Encounter (Signed)
Pt left v/m; pt scheduled for labs on 09/17/13 because pt was having to change cholesterol med due to ins coverage. Pt found out new med was too expensive. Pt switched ins coverage again and vytorin is covered so pt does not need to change med. Pt next appt with Dr Darnell Level is 12/04/13 and pt wants to know if should wait to closer to 12/2013 appt to have labs? Pt request cb.

## 2013-09-15 NOTE — Telephone Encounter (Signed)
Patient advised.   Lab appt rescheduled.

## 2013-09-15 NOTE — Telephone Encounter (Signed)
Yes agree with waiting closer to f/u date if no changes to med list planned.

## 2013-09-17 ENCOUNTER — Other Ambulatory Visit: Payer: Medicare Other

## 2013-10-04 ENCOUNTER — Other Ambulatory Visit: Payer: Self-pay | Admitting: Family Medicine

## 2013-11-26 ENCOUNTER — Other Ambulatory Visit (INDEPENDENT_AMBULATORY_CARE_PROVIDER_SITE_OTHER): Payer: Medicare Other

## 2013-11-26 DIAGNOSIS — E119 Type 2 diabetes mellitus without complications: Secondary | ICD-10-CM

## 2013-11-26 DIAGNOSIS — E782 Mixed hyperlipidemia: Secondary | ICD-10-CM

## 2013-11-26 LAB — COMPREHENSIVE METABOLIC PANEL
ALT: 15 U/L (ref 0–35)
AST: 18 U/L (ref 0–37)
Albumin: 4.2 g/dL (ref 3.5–5.2)
Alkaline Phosphatase: 53 U/L (ref 39–117)
BUN: 12 mg/dL (ref 6–23)
CALCIUM: 9.8 mg/dL (ref 8.4–10.5)
CHLORIDE: 106 meq/L (ref 96–112)
CO2: 26 meq/L (ref 19–32)
Creatinine, Ser: 0.9 mg/dL (ref 0.4–1.2)
GFR: 69.82 mL/min (ref >60.00)
Glucose, Bld: 142 mg/dL — ABNORMAL HIGH (ref 70–99)
POTASSIUM: 4.3 meq/L (ref 3.5–5.1)
SODIUM: 141 meq/L (ref 135–145)
TOTAL PROTEIN: 7.2 g/dL (ref 6.0–8.3)
Total Bilirubin: 0.7 mg/dL (ref 0.3–1.2)

## 2013-11-26 LAB — LIPID PANEL
CHOL/HDL RATIO: 3
Cholesterol: 159 mg/dL (ref 0–200)
HDL: 49.4 mg/dL (ref >39.00)
LDL Cholesterol: 76 mg/dL (ref 0–99)
Triglycerides: 168 mg/dL — ABNORMAL HIGH (ref 0.0–149.0)
VLDL: 33.6 mg/dL (ref 0.0–40.0)

## 2013-12-04 ENCOUNTER — Encounter: Payer: Self-pay | Admitting: Family Medicine

## 2013-12-04 ENCOUNTER — Ambulatory Visit (INDEPENDENT_AMBULATORY_CARE_PROVIDER_SITE_OTHER): Payer: Medicare Other | Admitting: Family Medicine

## 2013-12-04 VITALS — BP 132/80 | HR 71 | Temp 97.3°F | Wt 156.0 lb

## 2013-12-04 DIAGNOSIS — E119 Type 2 diabetes mellitus without complications: Secondary | ICD-10-CM

## 2013-12-04 DIAGNOSIS — Z636 Dependent relative needing care at home: Secondary | ICD-10-CM | POA: Insufficient documentation

## 2013-12-04 DIAGNOSIS — M5137 Other intervertebral disc degeneration, lumbosacral region: Secondary | ICD-10-CM | POA: Diagnosis not present

## 2013-12-04 DIAGNOSIS — M5136 Other intervertebral disc degeneration, lumbar region: Secondary | ICD-10-CM

## 2013-12-04 DIAGNOSIS — Z639 Problem related to primary support group, unspecified: Secondary | ICD-10-CM | POA: Diagnosis not present

## 2013-12-04 DIAGNOSIS — E782 Mixed hyperlipidemia: Secondary | ICD-10-CM

## 2013-12-04 DIAGNOSIS — M51369 Other intervertebral disc degeneration, lumbar region without mention of lumbar back pain or lower extremity pain: Secondary | ICD-10-CM

## 2013-12-04 MED ORDER — SERTRALINE HCL 25 MG PO TABS: 25.0000 mg | ORAL_TABLET | Freq: Every day | ORAL | Status: DC

## 2013-12-04 NOTE — Progress Notes (Signed)
Pre visit review using our clinic review tool, if applicable. No additional management support is needed unless otherwise documented below in the visit note. 

## 2013-12-04 NOTE — Assessment & Plan Note (Signed)
Discussed stressors.  After discussion, pt interested in trial of SSRI - will send zoloft 25mg  daily. Discussed common side effects.  Update if not tolerating well or any concerns. A total of 25 minutes were spent face-to-face with the patient during this encounter and over half of that time was spent on counseling and coordination of care

## 2013-12-04 NOTE — Patient Instructions (Signed)
prevnar today. We will check A1c next blood work. No changes today. Good to see you today, call us with questions. Return in 6 months for wellness exam.

## 2013-12-04 NOTE — Assessment & Plan Note (Signed)
Chronic, continue metformin. Foot exam today prevnar today.

## 2013-12-04 NOTE — Progress Notes (Signed)
BP 132/80  Pulse 71  Temp(Src) 97.3 F (36.3 C) (Oral)  Wt 156 lb (70.761 kg)  SpO2 97%   CC: 6 mo f/u  Subjective:    Patient ID: Sarah Velasquez, female    DOB: 18-Mar-1946, 68 y.o.   MRN: [id number]  HPI: Sarah Velasquez is a 68 y.o. female presenting on 12/04/2013 for Follow-up   Full sensation in chest occasionally.  Attributes to stress.  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  Husband with FTD (having trouble dealing with husband's illness) his memory is especially bad, aunt died last week.  Didn't like how buspar made her feel.  Doesn't want meds for this.  Does have Well Spouse group she meets with once a month.  Doesn't think would be able to afford counselor.  HLD - decided to stay with vytorin - changed insurance and was able to better afford vytorin.  Tolerating med well.  DM - regularly does check sugars today 103 nonfasting.  Compliant with antihyperglycemic regimen which includes: metformin 1000mg  bid.  Denies low sugars or hypoglycemic symptoms.  Denies paresthesias. Last diabetic eye exam 01/2013.  Pneumovax: 2013.  Prevnar: today.  GERD with h/o HH - zantac. Persistent back ache. Recent abnormal R breast mammogram (07/2014), but ok on repeat.  F/u 6 mo mammo pending.  Relevant past medical, surgical, family and social history reviewed and updated as indicated.  Allergies and medications reviewed and updated. Current Outpatient Prescriptions on File Prior to Visit  Medication Sig  . aspirin 81 MG tablet Take 81 mg by mouth daily.  . cetirizine (ZYRTEC ALLERGY) 10 MG tablet Take 10 mg by mouth daily.    . cholecalciferol (VITAMIN D) 1000 UNITS tablet Take 1,000 Units by mouth daily.   Marland Kitchen docusate sodium (COLACE) 100 MG capsule Take 200 mg by mouth daily.   Marland Kitchen estradiol (ESTRACE) 1 MG tablet Take 0.5 mg by mouth daily.  Marland Kitchen glucose blood test strip 1 each by Other route as needed for other (One Touch Ultra 1 each by Other route daily. Use as instructed to check sugars  daily.  250.00). Use as instructed  . metFORMIN (GLUCOPHAGE) 1000 MG tablet Take 1 tablet (1,000 mg total) by mouth 2 (two) times daily with a meal.  . Multiple Vitamin (MULTIVITAMIN) tablet Take 1 tablet by mouth daily.  Glory Rosebush DELICA LANCETS 33A MISC 1 each by Other route daily. As directed  . ranitidine (ZANTAC) 150 MG tablet Take 150 mg by mouth 2 (two) times daily.   Marland Kitchen VYTORIN 10-80 MG per tablet TAKE ONE TABLET AT BEDTIME   No current facility-administered medications on file prior to visit.    Review of Systems Per HPI unless specifically indicated above    Objective:    BP 132/80  Pulse 71  Temp(Src) 97.3 F (36.3 C) (Oral)  Wt 156 lb (70.761 kg)  SpO2 97%  Physical Exam  Nursing note and vitals reviewed. Constitutional: She appears well-developed and well-nourished. No distress.  HENT:  Head: Normocephalic and atraumatic.  Right Ear: External ear normal.  Left Ear: External ear normal.  Nose: Nose normal.  Mouth/Throat: Oropharynx is clear and moist. No oropharyngeal exudate.  Eyes: Conjunctivae and EOM are normal. Pupils are equal, round, and reactive to light. No scleral icterus.  Neck: Normal range of motion. Neck supple.  Cardiovascular: Normal rate, regular rhythm, normal heart sounds and intact distal pulses.   No murmur heard. Pulmonary/Chest: Effort normal and breath sounds normal. No respiratory distress.  She has no wheezes. She has no rales.  Musculoskeletal: She exhibits no edema.  Diabetic foot exam: Normal inspection No skin breakdown No calluses  Normal DP/PT pulses Normal sensation to light touch and monofilament Nails normal  Lymphadenopathy:    She has no cervical adenopathy.  Skin: Skin is warm and dry. No rash noted.  Psychiatric: She has a normal mood and affect.   Results for orders placed in visit on 11/26/13  LIPID PANEL      Result Value Ref Range   Cholesterol 159  0 - 200 mg/dL   Triglycerides 168.0 (*) 0.0 - 149.0 mg/dL    HDL 49.40  >39.00 mg/dL   VLDL 33.6  0.0 - 40.0 mg/dL   LDL Cholesterol 76  0 - 99 mg/dL   Total CHOL/HDL Ratio 3    COMPREHENSIVE METABOLIC PANEL      Result Value Ref Range   Sodium 141  135 - 145 mEq/L   Potassium 4.3  3.5 - 5.1 mEq/L   Chloride 106  96 - 112 mEq/L   CO2 26  19 - 32 mEq/L   Glucose, Bld 142 (*) 70 - 99 mg/dL   BUN 12  6 - 23 mg/dL   Creatinine, Ser 0.9  0.4 - 1.2 mg/dL   Total Bilirubin 0.7  0.3 - 1.2 mg/dL   Alkaline Phosphatase 53  39 - 117 U/L   AST 18  0 - 37 U/L   ALT 15  0 - 35 U/L   Total Protein 7.2  6.0 - 8.3 g/dL   Albumin 4.2  3.5 - 5.2 g/dL   Calcium 9.8  8.4 - 10.5 mg/dL   GFR 69.82  >60.00 mL/min      Assessment & Plan:   Problem List Items Addressed This Visit   DIABETES MELLITUS, TYPE II - Primary     Chronic, continue metformin. Foot exam today prevnar today.    Relevant Orders      HM DIABETES FOOT EXAM (Completed)   Mixed hyperlipidemia     Pt continues vytorin once daily.  Good control on latest FLP.    Degenerative disc disease, lumbar   Unspecified family circumstance     Discussed stressors.  After discussion, pt interested in trial of SSRI - will send zoloft 25mg  daily. Discussed common side effects.  Update if not tolerating well or any concerns. A total of 25 minutes were spent face-to-face with the patient during this encounter and over half of that time was spent on counseling and coordination of care         Follow up plan: Return as needed, for annual exam, prior fasting for blood work.

## 2013-12-04 NOTE — Assessment & Plan Note (Signed)
Pt continues vytorin once daily.  Good control on latest FLP.

## 2013-12-11 ENCOUNTER — Other Ambulatory Visit: Payer: Self-pay

## 2013-12-22 ENCOUNTER — Telehealth: Payer: Self-pay

## 2013-12-22 NOTE — Telephone Encounter (Signed)
Relevant patient education assigned to patient using Emmi. ° °

## 2013-12-24 DIAGNOSIS — Z124 Encounter for screening for malignant neoplasm of cervix: Secondary | ICD-10-CM | POA: Diagnosis not present

## 2013-12-24 DIAGNOSIS — Z01419 Encounter for gynecological examination (general) (routine) without abnormal findings: Secondary | ICD-10-CM | POA: Diagnosis not present

## 2013-12-29 ENCOUNTER — Other Ambulatory Visit: Payer: Self-pay | Admitting: Family Medicine

## 2013-12-30 ENCOUNTER — Other Ambulatory Visit: Payer: Self-pay | Admitting: *Deleted

## 2013-12-30 MED ORDER — GLUCOSE BLOOD VI STRP: ORAL_STRIP | Status: DC

## 2013-12-31 ENCOUNTER — Other Ambulatory Visit: Payer: Self-pay

## 2013-12-31 ENCOUNTER — Telehealth: Payer: Self-pay | Admitting: *Deleted

## 2013-12-31 MED ORDER — GLUCOSE BLOOD VI STRP: ORAL_STRIP | Status: DC

## 2013-12-31 MED ORDER — GLUCOSE BLOOD VI STRP: 1.0000 | ORAL_STRIP | Freq: Two times a day (BID) | Status: DC

## 2014-01-01 DIAGNOSIS — Z1211 Encounter for screening for malignant neoplasm of colon: Secondary | ICD-10-CM | POA: Diagnosis not present

## 2014-01-02 LAB — HM DIABETES EYE EXAM

## 2014-01-15 MED ORDER — GLUCOSE BLOOD VI STRP: 1.0000 | ORAL_STRIP | Freq: Every day | Status: DC

## 2014-01-15 NOTE — Addendum Note (Signed)
Addended by: Ria Bush on: 01/15/2014 11:27 PM   Modules accepted: Orders

## 2014-01-15 NOTE — Telephone Encounter (Signed)
Ok to change

## 2014-01-15 NOTE — Telephone Encounter (Addendum)
Sent in.plz notify pt.  

## 2014-01-15 NOTE — Telephone Encounter (Signed)
Pt left v/m; diabetic test strips were sent to [facility] for # 100. Pt was told by pharmacy that because of pt getting # 100 and instructions checking BS twice a day that the pt will have to fill out a log of checking BS twice a day. Pt does not have the time to ck BS twice a day due to pt taking care of husband with dementia and pts BS is stabilized and running OK. Pt request new rx sent to [facility] to test once daily or as directed so ins will pay for strips without pt having to keep log.Please advise.

## 2014-01-16 NOTE — Telephone Encounter (Signed)
Patient notified

## 2014-01-20 ENCOUNTER — Ambulatory Visit: Payer: Self-pay | Admitting: Obstetrics and Gynecology

## 2014-01-20 DIAGNOSIS — N6459 Other signs and symptoms in breast: Secondary | ICD-10-CM | POA: Diagnosis not present

## 2014-01-20 DIAGNOSIS — R922 Inconclusive mammogram: Secondary | ICD-10-CM | POA: Diagnosis not present

## 2014-02-02 HISTORY — PX: COLONOSCOPY: SHX174

## 2014-02-09 ENCOUNTER — Other Ambulatory Visit: Payer: Self-pay | Admitting: Family Medicine

## 2014-03-02 ENCOUNTER — Ambulatory Visit: Payer: Self-pay | Admitting: Unknown Physician Specialty

## 2014-03-02 DIAGNOSIS — Z881 Allergy status to other antibiotic agents status: Secondary | ICD-10-CM | POA: Diagnosis not present

## 2014-03-02 DIAGNOSIS — K648 Other hemorrhoids: Secondary | ICD-10-CM | POA: Diagnosis not present

## 2014-03-02 DIAGNOSIS — Z7982 Long term (current) use of aspirin: Secondary | ICD-10-CM | POA: Diagnosis not present

## 2014-03-02 DIAGNOSIS — Z860100 Personal history of colon polyps, unspecified: Secondary | ICD-10-CM | POA: Diagnosis not present

## 2014-03-02 DIAGNOSIS — Z79899 Other long term (current) drug therapy: Secondary | ICD-10-CM | POA: Diagnosis not present

## 2014-03-02 DIAGNOSIS — Z1211 Encounter for screening for malignant neoplasm of colon: Secondary | ICD-10-CM | POA: Diagnosis not present

## 2014-03-02 DIAGNOSIS — E119 Type 2 diabetes mellitus without complications: Secondary | ICD-10-CM | POA: Diagnosis not present

## 2014-03-02 DIAGNOSIS — K5730 Diverticulosis of large intestine without perforation or abscess without bleeding: Secondary | ICD-10-CM | POA: Diagnosis not present

## 2014-03-02 DIAGNOSIS — K219 Gastro-esophageal reflux disease without esophagitis: Secondary | ICD-10-CM | POA: Diagnosis not present

## 2014-03-02 DIAGNOSIS — Z09 Encounter for follow-up examination after completed treatment for conditions other than malignant neoplasm: Secondary | ICD-10-CM | POA: Diagnosis not present

## 2014-03-02 DIAGNOSIS — Z885 Allergy status to narcotic agent status: Secondary | ICD-10-CM | POA: Diagnosis not present

## 2014-03-07 ENCOUNTER — Encounter: Payer: Self-pay | Admitting: Family Medicine

## 2014-03-09 ENCOUNTER — Other Ambulatory Visit: Payer: Self-pay | Admitting: Family Medicine

## 2014-03-13 ENCOUNTER — Encounter: Payer: Self-pay | Admitting: Family Medicine

## 2014-03-13 DIAGNOSIS — E119 Type 2 diabetes mellitus without complications: Secondary | ICD-10-CM | POA: Diagnosis not present

## 2014-06-02 ENCOUNTER — Other Ambulatory Visit: Payer: Self-pay | Admitting: Family Medicine

## 2014-06-02 DIAGNOSIS — E782 Mixed hyperlipidemia: Secondary | ICD-10-CM

## 2014-06-02 DIAGNOSIS — E119 Type 2 diabetes mellitus without complications: Secondary | ICD-10-CM

## 2014-06-04 ENCOUNTER — Other Ambulatory Visit (INDEPENDENT_AMBULATORY_CARE_PROVIDER_SITE_OTHER): Payer: Medicare Other

## 2014-06-04 DIAGNOSIS — E119 Type 2 diabetes mellitus without complications: Secondary | ICD-10-CM | POA: Diagnosis not present

## 2014-06-04 DIAGNOSIS — E782 Mixed hyperlipidemia: Secondary | ICD-10-CM

## 2014-06-04 LAB — BASIC METABOLIC PANEL
BUN: 12 mg/dL (ref 6–23)
CHLORIDE: 106 meq/L (ref 96–112)
CO2: 26 mEq/L (ref 19–32)
CREATININE: 0.9 mg/dL (ref 0.4–1.2)
Calcium: 10 mg/dL (ref 8.4–10.5)
GFR: 68.79 mL/min (ref >60.00)
Glucose, Bld: 127 mg/dL — ABNORMAL HIGH (ref 70–99)
Potassium: 4.8 mEq/L (ref 3.5–5.1)
Sodium: 141 mEq/L (ref 135–145)

## 2014-06-04 LAB — LIPID PANEL
Cholesterol: 165 mg/dL (ref 0–200)
HDL: 54.7 mg/dL (ref >39.00)
LDL Cholesterol: 72 mg/dL (ref 0–99)
NONHDL: 110.3
Total CHOL/HDL Ratio: 3
Triglycerides: 194 mg/dL — ABNORMAL HIGH (ref 0.0–149.0)
VLDL: 38.8 mg/dL (ref 0.0–40.0)

## 2014-06-04 LAB — TSH: TSH: 1.89 u[IU]/mL (ref 0.35–4.50)

## 2014-06-04 LAB — HEMOGLOBIN A1C: Hgb A1c MFr Bld: 6.5 % (ref 4.6–6.5)

## 2014-06-08 ENCOUNTER — Other Ambulatory Visit: Payer: Self-pay | Admitting: Family Medicine

## 2014-06-09 ENCOUNTER — Encounter: Payer: Self-pay | Admitting: Family Medicine

## 2014-06-09 ENCOUNTER — Ambulatory Visit (INDEPENDENT_AMBULATORY_CARE_PROVIDER_SITE_OTHER): Payer: Medicare Other | Admitting: Family Medicine

## 2014-06-09 VITALS — BP 116/60 | HR 76 | Temp 98.1°F | Ht 64.0 in | Wt 156.5 lb

## 2014-06-09 DIAGNOSIS — E782 Mixed hyperlipidemia: Secondary | ICD-10-CM

## 2014-06-09 DIAGNOSIS — Z7189 Other specified counseling: Secondary | ICD-10-CM

## 2014-06-09 DIAGNOSIS — Z0000 Encounter for general adult medical examination without abnormal findings: Secondary | ICD-10-CM

## 2014-06-09 DIAGNOSIS — E119 Type 2 diabetes mellitus without complications: Secondary | ICD-10-CM | POA: Diagnosis not present

## 2014-06-09 DIAGNOSIS — J209 Acute bronchitis, unspecified: Secondary | ICD-10-CM | POA: Insufficient documentation

## 2014-06-09 DIAGNOSIS — B9789 Other viral agents as the cause of diseases classified elsewhere: Secondary | ICD-10-CM

## 2014-06-09 DIAGNOSIS — J069 Acute upper respiratory infection, unspecified: Secondary | ICD-10-CM

## 2014-06-09 MED ORDER — ESTRADIOL 0.5 MG PO TABS: 0.2500 mg | ORAL_TABLET | Freq: Every day | ORAL | Status: DC

## 2014-06-09 NOTE — Assessment & Plan Note (Addendum)
Discussed supportive care. Update if not improving in 1 wk or worsening.

## 2014-06-09 NOTE — Assessment & Plan Note (Signed)
Chronic, continue metformin. Well controlled today.

## 2014-06-09 NOTE — Assessment & Plan Note (Signed)
Advanced directives: has at home. Asked her to bring [place] a copy. Declines prolonged life support. Son [name] is HCPOA.

## 2014-06-09 NOTE — Assessment & Plan Note (Signed)

## 2014-06-09 NOTE — Progress Notes (Signed)
Pre visit review using our clinic review tool, if applicable. No additional management support is needed unless otherwise documented below in the visit note. 

## 2014-06-09 NOTE — Assessment & Plan Note (Signed)
Stable #s. Discussed dietary changes to lower trig.

## 2014-06-09 NOTE — Progress Notes (Signed)
BP 116/60  Pulse 76  Temp(Src) 98.1 F (36.7 C) (Oral)  Ht 5\' 4"  (1.626 m)  Wt 156 lb 8 oz (70.988 kg)  BMI 26.85 kg/m2   CC: medicare wellness visit  Subjective:    Patient ID: Sarah Velasquez, female    DOB: 1946/05/31, 68 y.o.   MRN: [id number]  HPI: Sarah Velasquez is a 68 y.o. female presenting on 06/09/2014 for Annual Exam   Not feeling well today - head cold. 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 Started feeling bad Friday morning - 5d ago. Has tried nothing OTC. No fevers. Body aches.  Hearing screen passed. Recent vision screen by eye doctor. Denies depression/anhedonia or falls.  Preventative: COLONOSCOPY Date: 02/2014 diverticulosis, int hem, rpt 5 yrs Vira Agar) Well woman with OBGYN. 01/2013. Had pap smear done 12/2010. Recent mammogram abnormal R s/p normal diagnostic mammo/US 01/2014. rec rpt screening 06/2014. Flu - will defer as not feeling well. Pneumovax 2013 Td 2010  zostavax 08/2011 Dexa 12/2011 normal per pt  Advanced directives: has at home. Asked her to bring [place] a copy. Declines prolonged life support. Son [name] is HCPOA.   Lives with husband - takes care of him (h/o FTD). Part time at daycare Activity: no regular exercise Diet: good water, fruits/vegetables daily  Relevant past medical, surgical, family and social history reviewed and updated as indicated.  Allergies and medications reviewed and updated. Current Outpatient Prescriptions on File Prior to Visit  Medication Sig  . aspirin 81 MG tablet Take 81 mg by mouth daily.  . cetirizine (ZYRTEC ALLERGY) 10 MG tablet Take 10 mg by mouth daily.    . cholecalciferol (VITAMIN D) 1000 UNITS tablet Take 1,000 Units by mouth daily.   Marland Kitchen docusate sodium (COLACE) 100 MG capsule Take 200 mg by mouth daily.   Marland Kitchen glucose blood (ONE TOUCH ULTRA TEST) test strip 1 each by Other route daily. 250.00  . metFORMIN (GLUCOPHAGE) 1000 MG tablet TAKE 1 TABLET BY MOUTH 2 TIMES DAILY WITH A MEAL  . Multiple Vitamin (MULTIVITAMIN) tablet  Take 1 tablet by mouth daily.  Glory Rosebush DELICA LANCETS 27O MISC 1 each by Other route daily. As directed  . ranitidine (ZANTAC) 150 MG tablet Take 150 mg by mouth 2 (two) times daily.   . sertraline (ZOLOFT) 25 MG tablet TAKE ONE TABLET EVERY DAY  . VYTORIN 10-80 MG per tablet TAKE ONE TABLET AT BEDTIME   No current facility-administered medications on file prior to visit.    Review of Systems Per HPI unless specifically indicated above    Objective:    BP 116/60  Pulse 76  Temp(Src) 98.1 F (36.7 C) (Oral)  Ht 5\' 4"  (1.626 m)  Wt 156 lb 8 oz (70.988 kg)  BMI 26.85 kg/m2  Physical Exam  Nursing note and vitals reviewed. Constitutional: She is oriented to person, place, and time. She appears well-developed and well-nourished. No distress.  HENT:  Head: Normocephalic and atraumatic.  Right Ear: Hearing, tympanic membrane, external ear and ear canal normal.  Left Ear: Hearing, tympanic membrane, external ear and ear canal normal.  Nose: Nose normal.  Mouth/Throat: Uvula is midline, oropharynx is clear and moist and mucous membranes are normal. No oropharyngeal exudate, posterior oropharyngeal edema or posterior oropharyngeal erythema.  Eyes: Conjunctivae and EOM are normal. Pupils are equal, round, and reactive to light. No scleral icterus.  Neck: Normal range of motion. Neck supple. Carotid bruit is not present. No thyromegaly present.  Cardiovascular: Normal rate, regular  rhythm, normal heart sounds and intact distal pulses.   No murmur heard. Pulses:      Radial pulses are 2+ on the right side, and 2+ on the left side.  Pulmonary/Chest: Effort normal and breath sounds normal. No respiratory distress. She has no wheezes. She has no rales.  Abdominal: Soft. Bowel sounds are normal. She exhibits no distension and no mass. There is no tenderness. There is no rebound and no guarding.  Musculoskeletal: Normal range of motion. She exhibits no edema.  Lymphadenopathy:    She has no  cervical adenopathy.  Neurological: She is alert and oriented to person, place, and time.  CN grossly intact, station and gait intact Recall 3/3  Calculation 4/5 serial 7s  Skin: Skin is warm and dry. No rash noted.  Psychiatric: She has a normal mood and affect. Her behavior is normal. Judgment and thought content normal.   Results for orders placed in visit on 06/09/14  HM DIABETES EYE EXAM      Result Value Ref Range   HM Diabetic Eye Exam No Retinopathy  No Retinopathy      Assessment & Plan:   Problem List Items Addressed This Visit   Viral URI with cough     Discussed supportive care. Update if not improving in 1 wk or worsening.    Mixed hyperlipidemia     Stable #s. Discussed dietary changes to lower trig.    Medicare annual wellness visit, subsequent - Primary     I have personally reviewed the Medicare Annual Wellness questionnaire and have noted 1. The patient's medical and social history 2. Their use of alcohol, tobacco or illicit drugs 3. Their current medications and supplements 4. The patient's functional ability including ADL's, fall risks, home safety risks and hearing or visual impairment. 5. Diet and physical activity 6. Evidence for depression or mood disorders The patients weight, height, BMI have been recorded in the chart.  Hearing and vision has been addressed. I have made referrals, counseling and provided education to the patient based review of the above and I have provided the pt with a written personalized care plan for preventive services. Provider list updated - see scanned questionairre.  Reviewed preventative protocols and updated unless pt declined.    Diabetes type 2, controlled     Chronic, continue metformin. Well controlled today.    Advanced care planning/counseling discussion     Advanced directives: has at home. Asked her to bring [place] a copy. Declines prolonged life support. Son [name] is HCPOA.         Follow up plan: Return in  about 1 year (around 06/10/2015), or as needed, for annual exam, prior fasting for blood work.

## 2014-06-09 NOTE — Patient Instructions (Addendum)
If you haven't heard from breast center in 1 month, call them to schedule next screening mammogram. Bring me copy of your advanced directive to update your chart. Continue tapering off estrogen.  Push fluids and rest. Should improve over next week. If persistent past 10 days or worsening cough , fever, let us know.  Take guaifenesin or plain mucinex with plenty of water to help mobilize mucous out.

## 2014-06-19 ENCOUNTER — Telehealth: Payer: Self-pay | Admitting: Family Medicine

## 2014-06-19 MED ORDER — AMOXICILLIN-POT CLAVULANATE 875-125 MG PO TABS: 1.0000 | ORAL_TABLET | Freq: Two times a day (BID) | ORAL | Status: AC

## 2014-06-19 NOTE — Telephone Encounter (Signed)
Patient notified and verbalized understanding. 

## 2014-06-19 NOTE — Telephone Encounter (Signed)
plz notify augmentin 10d course sent to her pharmacy. Update if persistent sxs or worsening despite antibiotics - would recommend office visit.

## 2014-06-19 NOTE — Telephone Encounter (Signed)
Pt called to inform you that her cold is not any better. She has tried Muscinex otc for over a week. She now has a "rattle" in chest and wheezing. Can something be called in to help her get over this.  [facility] on [address]  Pt call back [phone]

## 2014-08-24 ENCOUNTER — Encounter: Payer: Self-pay | Admitting: Family Medicine

## 2014-08-24 ENCOUNTER — Ambulatory Visit (INDEPENDENT_AMBULATORY_CARE_PROVIDER_SITE_OTHER): Payer: Medicare Other | Admitting: Family Medicine

## 2014-08-24 VITALS — BP 126/60 | HR 97 | Temp 98.5°F | Ht 64.0 in | Wt 159.0 lb

## 2014-08-24 DIAGNOSIS — J209 Acute bronchitis, unspecified: Secondary | ICD-10-CM

## 2014-08-24 DIAGNOSIS — J019 Acute sinusitis, unspecified: Secondary | ICD-10-CM | POA: Diagnosis not present

## 2014-08-24 DIAGNOSIS — E782 Mixed hyperlipidemia: Secondary | ICD-10-CM | POA: Diagnosis not present

## 2014-08-24 MED ORDER — SIMVASTATIN 80 MG PO TABS: 80.0000 mg | ORAL_TABLET | Freq: Every day | ORAL | Status: DC

## 2014-08-24 MED ORDER — EZETIMIBE 10 MG PO TABS: 10.0000 mg | ORAL_TABLET | Freq: Every day | ORAL | Status: DC

## 2014-08-24 MED ORDER — LEVOFLOXACIN 500 MG PO TABS: 500.0000 mg | ORAL_TABLET | Freq: Every day | ORAL | Status: DC

## 2014-08-24 NOTE — Progress Notes (Signed)
Dr. Frederico Hamman T. Seynabou Fults, MD, [facility] [place], [address] Phone: [phone] Fax: [phone]  08/24/2014  Patient: Sarah Velasquez, MRN: [id number], DOB: Jun 11, 1946, 68 y.o.  Primary Physician:  Ria Bush, MD  Chief Complaint: Cough and Sinusitis  Subjective:   Sarah Velasquez is a 68 y.o. very pleasant female patient who presents with the following:  Feeling pretty awful, did some robitussing, and then did a round of abx. Some mucinex DM. She is now been sick for 2 months, and she is having a lot of pain in her sinuses, she also is having a very productive cough productive of sputum. She was treated with a course of Augmentin x10 days, and she also has a significant history of working in daycare.  Hyperlipidemia, needs to have some changes made secondary to formulary changes. Vytorin - simvistatin 80 and zetia 10  Sinusitis Sx x 2 mo   Past Medical History, Surgical History, Social History, Family History, Problem List, Medications, and Allergies have been reviewed and updated if relevant.  ROS: GEN: Acute illness details above GI: Tolerating PO intake GU: maintaining adequate hydration and urination Pulm: No SOB Interactive and getting along well at home.  Otherwise, ROS is as per the HPI.   Objective:   BP 126/60 mmHg  Pulse 97  Temp(Src) 98.5 F (36.9 C) (Oral)  Ht 5\' 4"  (1.626 m)  Wt 159 lb (72.122 kg)  BMI 27.28 kg/m2  SpO2 97%   Gen: WDWN, NAD; alert,appropriate and cooperative throughout exam  HEENT: Normocephalic and atraumatic. Throat clear, w/o exudate, no LAD, R TM clear, L TM - good landmarks, No fluid present. rhinnorhea.  Left frontal and maxillary sinuses: Tender, max Right frontal and maxillary sinuses: Tender, ma  Neck: No ant or post LAD CV: RRR, No M/G/R Pulm: Breathing comfortably in no resp distress. no w/c/r Abd: S,NT,ND,+BS Extr: no c/c/e Psych: full affect, pleasant      Laboratory and Imaging Data:  Assessment and Plan:   Acute sinusitis treated with antibiotics in the past 60 days  Acute bronchitis, unspecified organism  Mixed hyperlipidemia  Acute sinusitis: ABX as below.   Reviewed symptomatic care as well as ABX in this case.    Step up to lvq  Split vytorin to base products  Follow-up: No Follow-up on file.  New Prescriptions   EZETIMIBE (ZETIA) 10 MG TABLET    Take 1 tablet (10 mg total) by mouth daily.   LEVOFLOXACIN (LEVAQUIN) 500 MG TABLET    Take 1 tablet (500 mg total) by mouth daily.   SIMVASTATIN (ZOCOR) 80 MG TABLET    Take 1 tablet (80 mg total) by mouth daily.   No orders of the defined types were placed in this encounter.    Signed,  Maud Deed. Darrian Grzelak, MD   Patient's Medications  New Prescriptions   EZETIMIBE (ZETIA) 10 MG TABLET    Take 1 tablet (10 mg total) by mouth daily.   LEVOFLOXACIN (LEVAQUIN) 500 MG TABLET    Take 1 tablet (500 mg total) by mouth daily.   SIMVASTATIN (ZOCOR) 80 MG TABLET    Take 1 tablet (80 mg total) by mouth daily.  Previous Medications   ASPIRIN 81 MG TABLET    Take 81 mg by mouth daily.   CETIRIZINE (ZYRTEC ALLERGY) 10 MG TABLET    Take 10 mg by mouth daily.     CHOLECALCIFEROL (VITAMIN D) 1000 UNITS TABLET  Take 1,000 Units by mouth daily.    DOCUSATE SODIUM (COLACE) 100 MG CAPSULE    Take 200 mg by mouth daily.    GLUCOSE BLOOD (ONE TOUCH ULTRA TEST) TEST STRIP    1 each by Other route daily. 250.00   METFORMIN (GLUCOPHAGE) 1000 MG TABLET    TAKE 1 TABLET BY MOUTH 2 TIMES DAILY WITH A MEAL   MULTIPLE VITAMIN (MULTIVITAMIN) TABLET    Take 1 tablet by mouth daily.   ONETOUCH DELICA LANCETS 94I MISC    1 each by Other route daily. As directed   RANITIDINE (ZANTAC) 150 MG TABLET    Take 150 mg by mouth 2 (two) times daily.    SERTRALINE (ZOLOFT) 25 MG TABLET    TAKE ONE TABLET EVERY DAY  Modified Medications   No medications on file  Discontinued Medications   ESTRADIOL (ESTRACE)  0.5 MG TABLET    Take 0.5 tablets (0.25 mg total) by mouth daily.   VYTORIN 10-80 MG PER TABLET    TAKE ONE TABLET AT BEDTIME

## 2014-08-24 NOTE — Progress Notes (Signed)
Pre visit review using our clinic review tool, if applicable. No additional management support is needed unless otherwise documented below in the visit note. 

## 2014-09-04 LAB — HM MAMMOGRAPHY: HM MAMMO: NORMAL

## 2014-09-21 ENCOUNTER — Ambulatory Visit: Payer: Self-pay | Admitting: Obstetrics and Gynecology

## 2014-09-21 DIAGNOSIS — Z1231 Encounter for screening mammogram for malignant neoplasm of breast: Secondary | ICD-10-CM | POA: Diagnosis not present

## 2014-09-23 ENCOUNTER — Telehealth: Payer: Self-pay

## 2014-09-23 DIAGNOSIS — R05 Cough: Secondary | ICD-10-CM | POA: Diagnosis not present

## 2014-09-23 NOTE — Telephone Encounter (Signed)
Patient Name: Sarah Velasquez  DOB: 1946/01/28    Initial Comment caller states she has been on several rounds of antibiotics - is still coughing constantly and has congestion   Nurse Assessment  Nurse: Thad Ranger, RN, Langley Gauss Date/Time (Eastern Time): 09/23/2014 2:54:12 PM  Confirm and document reason for call. If symptomatic, describe symptoms. ---Caller states she has been on several rounds of antibiotics - is still coughing constantly and has congestion. States she has had the cough x 3 mos and has been on multiple rounds of abx but has not been given any steroids or inhaler/neb tx's thus far. No fever but has wheezing. Denies diff breathing.  Has the patient traveled out of the country within the last 30 days? ---Not Applicable  Does the patient require triage? ---Yes  Related visit to physician within the last 2 weeks? ---Yes  Does the PT have any chronic conditions? (i.e. diabetes, asthma, etc.) ---No     Guidelines    Guideline Title Affirmed Question Affirmed Notes  Cough - Acute Productive Wheezing is present    Final Disposition User   See Physician within 4 Hours (or PCP triage) Thad Ranger, RN, Langley Gauss

## 2014-09-23 NOTE — Telephone Encounter (Signed)
PLEASE NOTE: All timestamps contained within this report are represented as [place] Standard Time. CONFIDENTIALTY NOTICE: This fax transmission is intended only for the addressee. It contains information that is legally privileged, confidential or otherwise protected from use or disclosure. If you are not the intended recipient, you are strictly prohibited from reviewing, disclosing, copying using or disseminating any of this information or taking any action in reliance on or regarding this information. If you have received this fax in error, please notify us immediately by telephone so that we can arrange for its return to [place]. Phone: [phone], Toll-Free: [phone], Fax: [phone] Page: 1 of 2 Call Id: [id number] [place] Patient Name: Sarah Velasquez Gender: Female DOB: 1946-08-27 Age: 69 Y 33 M 7 D Return Phone Number: [phone] (Primary), [phone] (Secondary) Address: [address] City/State/Zip: [place] [address] Client [facility] - Client Client Site [place] - Day Physician Ria Bush Contact Type Call Call Type Triage / Clinical Relationship To Patient Self Appointment Disposition EMR Appointment Attempted - Not Scheduled Return Phone Number [phone] (Primary) Chief Complaint Cough Initial Comment caller states she has been on several rounds of antibiotics - is still coughing constantly and has congestion [place] Not Listed [place] on [place] and [place], [place] PreDisposition Call Doctor Nurse Assessment Nurse: Thad Ranger, RN, Langley Gauss Date/Time ([place] Time): 09/23/2014 2:54:12 PM Confirm and document reason for call. If symptomatic, describe symptoms. ---Caller states she has been on several rounds of antibiotics - is still coughing constantly and has congestion. States she has had the cough x 3  mos and has been on multiple rounds of abx but has not been given any steroids or inhaler/neb tx's thus far. No fever but has wheezing. Denies diff breathing. Has the patient traveled out of the country within the last 30 days? ---Not Applicable Does the patient require triage? ---Yes Related visit to physician within the last 2 weeks? ---Yes Does the PT have any chronic conditions? (i.e. diabetes, asthma, etc.) ---No Guidelines Guideline Title Affirmed Question Affirmed Notes Nurse Date/Time Eilene Ghazi Time) Cough - Acute Productive Wheezing is present [place], RN, Denise 09/23/2014 2:56:01 PM Disp. Time Eilene Ghazi Time) Disposition Final User 09/23/2014 3:04:34 PM Call Completed Thad Ranger, RN, Langley Gauss 09/23/2014 2:57:02 PM See Physician within 4 Hours (or PCP triage) Yes [place], RN, Yevette Edwards Understands: Yes Disagree/Comply: Comply PLEASE NOTE: All timestamps contained within this report are represented as [place] Standard Time. CONFIDENTIALTY NOTICE: This fax transmission is intended only for the addressee. It contains information that is legally privileged, confidential or otherwise protected from use or disclosure. If you are not the intended recipient, you are strictly prohibited from reviewing, disclosing, copying using or disseminating any of this information or taking any action in reliance on or regarding this information. If you have received this fax in error, please notify us immediately by telephone so that we can arrange for its return to [place]. Phone: [phone], Toll-Free: [phone], Fax: [phone] Page: 2 of 2 Call Id: [id number] Care Advice Given Per Guideline SEE PHYSICIAN WITHIN 4 HOURS (or PCP triage): AVOID TOBACCO SMOKE: Smoking or being exposed to smoke makes coughs much worse. CALL BACK IF: * You become worse. CARE ADVICE given per Cough - Acute Productive (Adult) guideline. After Care Instructions Given Call Event Type User Date / Time  Description Referrals GO TO FACILITY OTHER - SPECIFY

## 2014-09-24 ENCOUNTER — Ambulatory Visit: Payer: Medicare Other | Admitting: Family Medicine

## 2014-09-24 ENCOUNTER — Telehealth: Payer: Self-pay | Admitting: Family Medicine

## 2014-09-24 NOTE — Telephone Encounter (Signed)
Patient will come in for a follow up on Monday 09/28/14.

## 2014-09-24 NOTE — Telephone Encounter (Signed)
Noted. Thanks for calling. Doesn't need f/u as seen yesterday. F/u next week if not improving as expected each day.

## 2014-09-24 NOTE — Telephone Encounter (Signed)
Called pt to let her know that we can see her today. She went to the urgent care center last night because the nurse at [facility] told her to do that.  The doctor at [facility] told her she has a pocket of pneumonia and gave her Benzonate, Prednisone, Doxycycline, and Hydromet cough syrup.  She is feeling a little better today.  She wants to know if Dr. Danise Mina wants her to come in today at 3:15 even though she went to [facility].

## 2014-09-24 NOTE — Telephone Encounter (Signed)
plz schedule appt for 3:15pm today if able - we are cancelling Sarah Velasquez's visit.

## 2014-09-24 NOTE — Telephone Encounter (Signed)
Note from [facility]

## 2014-09-24 NOTE — Telephone Encounter (Signed)
Went to UC last PM. Following up 09/28/14. See other phone note.

## 2014-09-28 ENCOUNTER — Encounter: Payer: Self-pay | Admitting: Family Medicine

## 2014-09-28 ENCOUNTER — Ambulatory Visit (INDEPENDENT_AMBULATORY_CARE_PROVIDER_SITE_OTHER): Payer: Medicare Other | Admitting: Family Medicine

## 2014-09-28 VITALS — BP 124/72 | HR 74 | Temp 97.8°F | Wt 159.8 lb

## 2014-09-28 DIAGNOSIS — J209 Acute bronchitis, unspecified: Secondary | ICD-10-CM | POA: Diagnosis not present

## 2014-09-28 NOTE — Progress Notes (Signed)
BP 124/72 mmHg  Pulse 74  Temp(Src) 97.8 F (36.6 C) (Oral)  Wt 159 lb 12 oz (72.462 kg)  SpO2 97%   CC: f/u PNA  Subjective:    Patient ID: Sarah Velasquez, female    DOB: [birth year], 69 y.o.   MRN: [id number]  HPI: Sarah Velasquez is a 69 y.o. female presenting on 09/28/2014 for URI   Seen here 08/24/2014 by Dr Lorelei Pont with 67mo h/o sxs that failed augmentin course (06/19/2014), dx sinusitis/bronchitis and treated with levaquin course.  Did not improve so seen at [facility] late last week, dx with pulm congestion and "pocket of PNA", treated with tessalon, prednisone, doxycycline and hydromet cough syrup. Has also taken mucinex DM, delsym. Cough better in am, worse in late afternoon and night.   Works at [employer]. Thinks she has caught recurrent infections at work.   Hydromet causing nausea/vomiting so she has stopped this.  No h/o asthma, no smoking history.  Persistent wheezing and fatigue. No dyspnea.  Out of work for last 1+ week (school was out).   Relevant past medical, surgical, family and social history reviewed and updated as indicated. Interim medical history since our last visit reviewed. Allergies and medications reviewed and updated. Current Outpatient Prescriptions on File Prior to Visit  Medication Sig  . aspirin 81 MG tablet Take 81 mg by mouth daily.  . cetirizine (ZYRTEC ALLERGY) 10 MG tablet Take 10 mg by mouth daily.    . cholecalciferol (VITAMIN D) 1000 UNITS tablet Take 1,000 Units by mouth daily.   Marland Kitchen docusate sodium (COLACE) 100 MG capsule Take 200 mg by mouth daily.   Marland Kitchen glucose blood (ONE TOUCH ULTRA TEST) test strip 1 each by Other route daily. 250.00  . metFORMIN (GLUCOPHAGE) 1000 MG tablet TAKE 1 TABLET BY MOUTH 2 TIMES DAILY WITH A MEAL  . Multiple Vitamin (MULTIVITAMIN) tablet Take 1 tablet by mouth daily.  Sarah Velasquez DELICA LANCETS 78G MISC 1 each by Other route daily. As directed  . ranitidine (ZANTAC) 150 MG tablet Take 150 mg by mouth 2 (two) times daily.   .  sertraline (ZOLOFT) 25 MG tablet TAKE ONE TABLET EVERY DAY  . ezetimibe (ZETIA) 10 MG tablet Take 1 tablet (10 mg total) by mouth daily. (Patient not taking: Reported on 09/28/2014)  . simvastatin (ZOCOR) 80 MG tablet Take 1 tablet (80 mg total) by mouth daily. (Patient not taking: Reported on 09/28/2014)   No current facility-administered medications on file prior to visit.    Review of Systems Per HPI unless specifically indicated above     Objective:    BP 124/72 mmHg  Pulse 74  Temp(Src) 97.8 F (36.6 C) (Oral)  Wt 159 lb 12 oz (72.462 kg)  SpO2 97%  Wt Readings from Last 3 Encounters:  09/28/14 159 lb 12 oz (72.462 kg)  08/24/14 159 lb (72.122 kg)  06/09/14 156 lb 8 oz (70.988 kg)    Physical Exam  Constitutional: She appears well-developed and well-nourished. No distress.  Tired appearing  HENT:  Head: Normocephalic and atraumatic.  Right Ear: Hearing, tympanic membrane, external ear and ear canal normal.  Left Ear: Hearing, tympanic membrane, external ear and ear canal normal.  Nose: No mucosal edema or rhinorrhea. Right sinus exhibits no maxillary sinus tenderness and no frontal sinus tenderness. Left sinus exhibits no maxillary sinus tenderness and no frontal sinus tenderness.  Mouth/Throat: Uvula is midline, oropharynx is clear and moist and mucous membranes are normal. No oropharyngeal exudate, posterior oropharyngeal  edema, posterior oropharyngeal erythema or tonsillar abscesses.  Eyes: Conjunctivae and EOM are normal. Pupils are equal, round, and reactive to light. No scleral icterus.  Neck: Normal range of motion. Neck supple.  Cardiovascular: Normal rate, regular rhythm, normal heart sounds and intact distal pulses.   No murmur heard. Pulmonary/Chest: Effort normal. No respiratory distress. She has no wheezes. She has rhonchi (faint scattered bibasilar). She has no rales.  Lymphadenopathy:    She has no cervical adenopathy.  Skin: Skin is warm and dry. No rash  noted.  Nursing note and vitals reviewed.      Assessment & Plan:   Problem List Items Addressed This Visit    Acute bronchitis - Primary    Reviewed illness course up to now. Anticipate persistent bronchitis but seems to be on mend with prednisone and doxy course. Push fluids and rest, finish doxy/prednisone, continue tessalon perls Stop hydrocodone cough syrup as causing nausea. Red flags to return discussed. Pt agrees with plan. No h/o asthma/RAD, but if persistent sxs would consider chronic bronchitis.          Follow up plan: Return if symptoms worsen or fail to improve.

## 2014-09-28 NOTE — Assessment & Plan Note (Addendum)
Reviewed illness course up to now. Anticipate persistent bronchitis but seems to be on mend with prednisone and doxy course. Push fluids and rest, finish doxy/prednisone, continue tessalon perls Stop hydrocodone cough syrup as causing nausea. Red flags to return discussed. Pt agrees with plan. No h/o asthma/RAD, but if persistent sxs would consider chronic bronchitis.

## 2014-09-28 NOTE — Progress Notes (Signed)
Pre visit review using our clinic review tool, if applicable. No additional management support is needed unless otherwise documented below in the visit note. 

## 2014-09-28 NOTE — Patient Instructions (Signed)
Stop hydrocodone cough syrup as it's making you nauseated. May take over the counter delsym or dimetapp for cough at night. Continue tessalon perls, finish doxycycline course. Finish steroid course then start ibuprofen 400-600mg  twice daily with food. Update [place] if worsening productive cough or fever >101 or not improving as expected.

## 2014-10-22 ENCOUNTER — Other Ambulatory Visit: Payer: Self-pay | Admitting: *Deleted

## 2014-10-22 ENCOUNTER — Telehealth: Payer: Self-pay

## 2014-10-22 MED ORDER — GLUCOSE BLOOD VI STRP: 1.0000 | ORAL_STRIP | Freq: Every day | Status: DC

## 2014-10-22 NOTE — Telephone Encounter (Signed)
Rx sent in and message left notifying patient.

## 2014-10-22 NOTE — Telephone Encounter (Signed)
Pt left v/m requesting fax from [facility] [address] for diabetic supplies be completed and sent back to pharmacy. Pt request cb when done.

## 2014-11-01 ENCOUNTER — Other Ambulatory Visit: Payer: Self-pay | Admitting: Family Medicine

## 2014-11-25 ENCOUNTER — Telehealth: Payer: Self-pay

## 2014-11-25 NOTE — Telephone Encounter (Signed)
Left message for pt to call back with flu vaccine info

## 2015-01-13 ENCOUNTER — Encounter: Payer: Self-pay | Admitting: Family Medicine

## 2015-01-13 ENCOUNTER — Ambulatory Visit (INDEPENDENT_AMBULATORY_CARE_PROVIDER_SITE_OTHER): Payer: Medicare Other | Admitting: Family Medicine

## 2015-01-13 ENCOUNTER — Ambulatory Visit (INDEPENDENT_AMBULATORY_CARE_PROVIDER_SITE_OTHER)
Admission: RE | Admit: 2015-01-13 | Discharge: 2015-01-13 | Disposition: A | Discharge status: Home / Self Care | Payer: Medicare Other | Source: Ambulatory Visit | Attending: Family Medicine | Admitting: Family Medicine

## 2015-01-13 VITALS — BP 124/70 | HR 86 | Temp 97.8°F | Wt 157.5 lb

## 2015-01-13 DIAGNOSIS — R059 Cough, unspecified: Secondary | ICD-10-CM

## 2015-01-13 DIAGNOSIS — R05 Cough: Secondary | ICD-10-CM | POA: Insufficient documentation

## 2015-01-13 DIAGNOSIS — M6530 Trigger finger, unspecified finger: Secondary | ICD-10-CM | POA: Diagnosis not present

## 2015-01-13 MED ORDER — AMOXICILLIN-POT CLAVULANATE 875-125 MG PO TABS: 1.0000 | ORAL_TABLET | Freq: Two times a day (BID) | ORAL | Status: DC

## 2015-01-13 MED ORDER — BENZONATATE 200 MG PO CAPS: 200.0000 mg | ORAL_CAPSULE | Freq: Three times a day (TID) | ORAL | Status: DC | PRN

## 2015-01-13 NOTE — Assessment & Plan Note (Signed)
Old notes from Dr. Darnell Level and [place] reviewed, with prev abx use.  Known Dm2 and no wheeze, so will not start pred at this point.  Lungs still ctab, likely with cough from PND from sinusitis.   Would still check CXR given the duration of the cough.  Start augmentin. Continue flonase, recently started by patient.  Continue fluids and salt water gargles.  Still okay for outpatient f/u.   D/w pt.  She agrees.  Routed to PCP as FYI.  If not improved, she may need sinus CT vs ENT referral.

## 2015-01-13 NOTE — Progress Notes (Signed)
Pre visit review using our clinic review tool, if applicable. No additional management support is needed unless otherwise documented below in the visit note.  "I've been sick for months."  She had been seen in fall 2015 and early 2016.  She was treated in fall 2015 with abx, with some improvement.   She worsened again, tx'd with levaquin in 08/2014.   Seen at [facility] in 09/2014, started on tessalon and doxy.    She has coughed everyday since 09/2013 per patient report.   She had tried robitussin, delsym DM, and othe cough meds without helping the cough.   Still with persistent post nasal gtt.  Cough continues.  No fevers.  L ear pain.  Gargling with salt water for sore throat.   No wheeze.  Some sputum, brownish yellow.  Also with discolored rhinorrhea.   No vomiting, no diarrhea.    She just started flonase, about 3 days ago.  Sugar has been controlled per patient report.    She has some L thumb triggering.    Meds, vitals, and allergies reviewed.   ROS: See HPI.  Otherwise, noncontributory.  GEN: nad, alert and oriented HEENT: mucous membranes moist, tm w/o erythema, nasal exam w/o erythema, clear discharge noted,  OP with cobblestoning, sinuses ttp x4 NECK: supple w/o LA CV: rrr.   PULM: ctab, no inc wob, cough note but no focal dec in BS EXT: no edema SKIN: no acute rash L thumb with triggering noted on ROM testing.

## 2015-01-13 NOTE — Patient Instructions (Addendum)
Ask about seeing Dr. Lorelei Pont about your thumb when your cough is better.  Go to the lab on the way out.  We'll contact you with your xray report. Start augmentin and tessalon in the meantime.  Keep gargling with salt water and using flonase.  Take care.  Glad to see you.

## 2015-01-13 NOTE — Assessment & Plan Note (Signed)
Will ask her to see Dr. Lorelei Pont after her cough is improved.  She agrees.

## 2015-01-27 ENCOUNTER — Ambulatory Visit (INDEPENDENT_AMBULATORY_CARE_PROVIDER_SITE_OTHER): Payer: Medicare Other | Admitting: Family Medicine

## 2015-01-27 ENCOUNTER — Encounter: Payer: Self-pay | Admitting: Family Medicine

## 2015-01-27 VITALS — BP 110/62 | HR 75 | Temp 97.8°F | Ht 64.0 in | Wt 157.5 lb

## 2015-01-27 DIAGNOSIS — M65312 Trigger thumb, left thumb: Secondary | ICD-10-CM | POA: Diagnosis not present

## 2015-01-27 MED ORDER — METHYLPREDNISOLONE ACETATE 40 MG/ML IJ SUSP
20.0000 mg | Freq: Once | INTRAMUSCULAR | Status: AC
  Administered 2015-01-27: 20 mg via INTRA_ARTICULAR

## 2015-01-27 NOTE — Progress Notes (Signed)
   Dr. Frederico Hamman T. Yaslyn Cumby, MD, [facility] [place], [address] Phone: [phone] Fax: [phone]  01/27/2015  Patient: Sarah Velasquez, MRN: [id number], DOB: [birth year], 69 y.o.  Primary Physician:  Ria Bush, MD  Chief Complaint: Trigger Finger    Trigger thumb:  L inj  Trigger Finger Injection, L thumb Verbal consent was obtained. Risks (including rare risk of infection, potential risk for skin lightening and potential atrophy), benefits and alternatives were discussed. Prepped with Chloraprep and Ethyl Chloride used for anesthesia. Under sterile conditions, patient injected distally with 45 degree angle towards nodule; injected directly into tendon sheath. Medication flowed freely without resistance.  Needle size: 22 gauge 1 1/2 inch Injection: 1/2 cc of Lidocaine 1% and Depo-Medrol 20 mg   Signed,  Gerold Sar T. Verania Salberg, MD

## 2015-01-27 NOTE — Progress Notes (Signed)
Pre visit review using our clinic review tool, if applicable. No additional management support is needed unless otherwise documented below in the visit note. 

## 2015-02-24 ENCOUNTER — Ambulatory Visit (INDEPENDENT_AMBULATORY_CARE_PROVIDER_SITE_OTHER): Payer: Medicare Other | Admitting: Family Medicine

## 2015-02-24 ENCOUNTER — Encounter: Payer: Self-pay | Admitting: Family Medicine

## 2015-02-24 VITALS — BP 130/72 | HR 73 | Temp 97.9°F | Ht 64.0 in | Wt 153.2 lb

## 2015-02-24 DIAGNOSIS — M65332 Trigger finger, left middle finger: Secondary | ICD-10-CM

## 2015-02-24 DIAGNOSIS — M65342 Trigger finger, left ring finger: Secondary | ICD-10-CM

## 2015-02-24 DIAGNOSIS — M65312 Trigger thumb, left thumb: Secondary | ICD-10-CM | POA: Diagnosis not present

## 2015-02-24 MED ORDER — METHYLPREDNISOLONE ACETATE 40 MG/ML IJ SUSP
60.0000 mg | Freq: Once | INTRAMUSCULAR | Status: AC
  Administered 2015-02-24: 60 mg via INTRA_ARTICULAR

## 2015-02-24 NOTE — Progress Notes (Signed)
   Dr. Frederico Hamman T. Larita Deremer, MD, [facility] [place], [address] Phone: [phone] Fax: [phone]  02/24/2015  Patient: Sarah Velasquez, MRN: [id number], DOB: 11-12-1945, 69 y.o.  Primary Physician:  Ria Bush, MD  Chief Complaint: Follow-up   Procedural visit only.  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  I tried to injected about a month ago, and she has had some return of symptoms.  This is causing her quite a bit of pain.  She also now has 2 additional trigger fingers on the third and fourth which are bothering her.  The nodules are quite a bit smaller compared to the one on her thumb.  L trigger thumb.  L hand, 1, 3, 4.  Trigger Finger Injection, L THUMB Verbal consent was obtained. Risks (including rare risk of infection, potential risk for skin lightening and potential atrophy), benefits and alternatives were discussed. Prepped with Chloraprep and Ethyl Chloride used for anesthesia. Under sterile conditions, patient injected at palmar crease aiming distally with 45 degree angle towards nodule; injected directly into tendon sheath. Medication flowed freely without resistance.  Needle size: 22 gauge 1 1/2 inch Injection: 1/2 cc of Lidocaine 1% and Depo-Medrol 20 mg   Trigger Finger Injection, L 3rd finger Verbal consent was obtained. Risks (including rare risk of infection, potential risk for skin lightening and potential atrophy), benefits and alternatives were discussed. Prepped with Chloraprep and Ethyl Chloride used for anesthesia. Under sterile conditions, patient injected at palmar crease aiming distally with 45 degree angle towards nodule; injected directly into tendon sheath. Medication flowed freely without resistance.  Needle size: 22 gauge 1 1/2 inch Injection: 1/2 cc of Lidocaine 1% and Depo-Medrol 20 mg   Trigger Finger Injection, L 4th  finger Verbal consent was obtained. Risks (including rare risk of infection, potential risk for skin lightening and potential atrophy), benefits and alternatives were discussed. Prepped with Chloraprep and Ethyl Chloride used for anesthesia. Under sterile conditions, patient injected at palmar crease aiming distally with 45 degree angle towards nodule; injected directly into tendon sheath. Medication flowed freely without resistance.  Needle size: 22 gauge 1 1/2 inch Injection: 1/2 cc of Lidocaine 1% and Depo-Medrol 20 mg   Signed,  Jaevian Shean T. Kvion Shapley, MD

## 2015-02-24 NOTE — Progress Notes (Signed)
Pre visit review using our clinic review tool, if applicable. No additional management support is needed unless otherwise documented below in the visit note. 

## 2015-03-09 ENCOUNTER — Other Ambulatory Visit: Payer: Self-pay | Admitting: Family Medicine

## 2015-03-10 ENCOUNTER — Encounter: Payer: Self-pay | Admitting: Urgent Care

## 2015-03-10 ENCOUNTER — Emergency Department
Admission: EM | Admit: 2015-03-10 | Discharge: 2015-03-10 | Disposition: A | Discharge status: Home / Self Care | Payer: Medicare Other | Attending: Emergency Medicine | Admitting: Emergency Medicine

## 2015-03-10 DIAGNOSIS — N10 Acute tubulo-interstitial nephritis: Secondary | ICD-10-CM | POA: Insufficient documentation

## 2015-03-10 DIAGNOSIS — N12 Tubulo-interstitial nephritis, not specified as acute or chronic: Secondary | ICD-10-CM

## 2015-03-10 DIAGNOSIS — Z7982 Long term (current) use of aspirin: Secondary | ICD-10-CM | POA: Diagnosis not present

## 2015-03-10 DIAGNOSIS — E119 Type 2 diabetes mellitus without complications: Secondary | ICD-10-CM | POA: Insufficient documentation

## 2015-03-10 DIAGNOSIS — Z87442 Personal history of urinary calculi: Secondary | ICD-10-CM | POA: Diagnosis not present

## 2015-03-10 DIAGNOSIS — Z79899 Other long term (current) drug therapy: Secondary | ICD-10-CM | POA: Diagnosis not present

## 2015-03-10 DIAGNOSIS — R300 Dysuria: Secondary | ICD-10-CM | POA: Diagnosis present

## 2015-03-10 LAB — URINALYSIS COMPLETE WITH MICROSCOPIC (ARMC ONLY)
Bilirubin Urine: NEGATIVE
Glucose, UA: NEGATIVE mg/dL
KETONES UR: NEGATIVE mg/dL
NITRITE: NEGATIVE
PROTEIN: NEGATIVE mg/dL
Specific Gravity, Urine: 1.001 — ABNORMAL LOW (ref 1.005–1.030)
pH: 7 (ref 5.0–8.0)

## 2015-03-10 LAB — GLUCOSE, CAPILLARY: Glucose-Capillary: 109 mg/dL — ABNORMAL HIGH (ref 65–99)

## 2015-03-10 MED ORDER — CEPHALEXIN 500 MG PO CAPS
500.0000 mg | ORAL_CAPSULE | Freq: Once | ORAL | Status: AC
  Administered 2015-03-10: 500 mg via ORAL

## 2015-03-10 MED ORDER — CEPHALEXIN 500 MG PO CAPS
ORAL_CAPSULE | ORAL | Status: AC
  Administered 2015-03-10: 500 mg via ORAL
  Filled 2015-03-10: qty 1

## 2015-03-10 MED ORDER — CEPHALEXIN 500 MG PO CAPS: 500.0000 mg | ORAL_CAPSULE | Freq: Three times a day (TID) | ORAL | Status: AC

## 2015-03-10 NOTE — ED Notes (Signed)
Clean catch urine sample provided. Urine much more clear at this time; slight red tinge noted, however the degree of red color does not compare to the sample that she presented with. Sample sent to lab for testing.

## 2015-03-10 NOTE — Discharge Instructions (Signed)
Pyelonephritis, Adult °Pyelonephritis is a kidney infection. In general, there are 2 main types of pyelonephritis: °· Infections that come on quickly without any warning (acute pyelonephritis). °· Infections that persist for a long period of time (chronic pyelonephritis). °CAUSES  °Two main causes of pyelonephritis are: °· Bacteria traveling from the bladder to the kidney. This is a problem especially in pregnant women. The urine in the bladder can become filled with bacteria from multiple causes, including: °¨ Inflammation of the prostate gland (prostatitis). °¨ Sexual intercourse in females. °¨ Bladder infection (cystitis). °· Bacteria traveling from the bloodstream to the tissue part of the kidney. °Problems that may increase your risk of getting a kidney infection include: °· Diabetes. °· Kidney stones or bladder stones. °· Cancer. °· Catheters placed in the bladder. °· Other abnormalities of the kidney or ureter. °SYMPTOMS  °· Abdominal pain. °· Pain in the side or flank area. °· Fever. °· Chills. °· Upset stomach. °· Blood in the urine (dark urine). °· Frequent urination. °· Strong or persistent urge to urinate. °· Burning or stinging when urinating. °DIAGNOSIS  °Your caregiver may diagnose your kidney infection based on your symptoms. A urine sample may also be taken. °TREATMENT  °In general, treatment depends on how severe the infection is.  °· If the infection is mild and caught early, your caregiver may treat you with oral antibiotics and send you home. °· If the infection is more severe, the bacteria may have gotten into the bloodstream. This will require intravenous (IV) antibiotics and a hospital stay. Symptoms may include: °¨ High fever. °¨ Severe flank pain. °¨ Shaking chills. °· Even after a hospital stay, your caregiver may require you to be on oral antibiotics for a period of time. °· Other treatments may be required depending upon the cause of the infection. °HOME CARE INSTRUCTIONS  °· Take your  antibiotics as directed. Finish them even if you start to feel better. °· Make an appointment to have your urine checked to make sure the infection is gone. °· Drink enough fluids to keep your urine clear or pale yellow. °· Take medicines for the bladder if you have urgency and frequency of urination as directed by your caregiver. °SEEK IMMEDIATE MEDICAL CARE IF:  °· You have a fever or persistent symptoms for more than 2-3 days. °· You have a fever and your symptoms suddenly get worse. °· You are unable to take your antibiotics or fluids. °· You develop shaking chills. °· You experience extreme weakness or fainting. °· There is no improvement after 2 days of treatment. °MAKE SURE YOU: °· Understand these instructions. °· Will watch your condition. °· Will get help right away if you are not doing well or get worse. °Document Released: 08/21/2005 Document Revised: 02/20/2012 Document Reviewed: 01/25/2011 °ExitCare® Patient Information ©2015 [employer]. This information is not intended to replace advice given to you by your health care provider. Make sure you discuss any questions you have with your health care provider. ° °

## 2015-03-10 NOTE — ED Notes (Signed)
Called lab to order a urine culture

## 2015-03-10 NOTE — ED Notes (Signed)
Patient states she went to the bathroom when she was brought back and stated that urine appeared clearer and did not noticed blood. Patient states earlier it appeared "like I was peeing blood."

## 2015-03-10 NOTE — ED Notes (Addendum)
Patient presents with c/o dysuria with (+) gross hematuria that began this afternoon. Patient with lower back pain as well. Urine sample brought with patient - noted to be red in color; unable to determine clarity due to dark red appearance - discarded and patient asked to provide clean catch sample.

## 2015-03-10 NOTE — ED Provider Notes (Signed)
[facility] Emergency Department Provider Note  ____________________________________________  Time seen: Approximately 920 PM  I have reviewed the triage vital signs and the nursing notes.   HISTORY  Chief Complaint Hematuria and Dysuria    HPI Sarah Velasquez is a 69 y.o. female with a history of diabetes and past UTI who presents today with 1 day of burning with urination and blood in her urine. She also reports some mild low back aching across her lower back which she says is chronic. She denies any fever, nausea or vomiting. No history of kidney stones. Diarrhea. No vaginal bleeding or discharge.   Past Medical History  Diagnosis Date  . Diabetes mellitus     typeII  . Hyperlipidemia 05/1995  . Diverticulosis of colon 09/2003    colonoscopy ext.and internal hemorrhoids (Dr. Vira Agar )12/03/2006// colonoscopy polyps multiple  benign; interal hemorrhoids ,divertics 09/16/2003  . GERD (gastroesophageal reflux disease)     EGD hiatal hernia; esophagitis/sigmoidoscopy WNL (Dr. Vira Agar) 03/04/1996: ABD. U/S  WNL (06/1996)//EGD multiple polyps ;small H.H (09/16/2003)  . Chest pain 02/15-16/2009    Hospital [facility]  chest pain rule out //stress Myoview negative 10/21/2007//Stress cardiolite ;apical thinning ,negative ischemia (09/14/2003)  . Osteopenia 12/2011    spine -1.8, hip -0.7  . Right lumbar radiculitis 2013    DDD, spondylosis, L3 radiculitis (MRI 07/2012) - ESI 09/2011 (Dr Sharlet Salina)   . H/O hiatal hernia   . PONV (postoperative nausea and vomiting)   . Vertigo     hx of  . Environmental allergies   . Drug-induced constipation     Patient Active Problem List   Diagnosis Date Noted  . Cough 01/13/2015  . Trigger finger, acquired 01/13/2015  . Advanced care planning/counseling discussion 06/09/2014  . Acute bronchitis 06/09/2014  . Unspecified family circumstance 12/04/2013  . Epistaxis 02/05/2013  . Medicare annual wellness visit, subsequent 09/21/2012   . Degenerative disc disease, lumbar 06/29/2012  . Osteopenia 12/04/2011  . UNSPECIFIED VITAMIN D DEFICIENCY 10/12/2009  . Mixed hyperlipidemia 07/26/2007  . ALLERGIC RHINITIS 01/09/2007  . FIBROCYSTIC BREAST DISEASE 01/09/2007  . DISORDER, MENOPAUSAL NEC 01/09/2007  . Diabetes type 2, controlled 01/07/2007  . GERD 01/07/2007  . ANXIETY DISORDER, GENERALIZED 07/18/2006    Past Surgical History  Procedure Laterality Date  . Abdominal hysterectomy  1986    ovaries intact Uterine prolapse  . Nasal sinus surgery  1978  . Vaginal delivery      NSVD x 2  . Tubal ligation    . Dexa  12/2011    spine -1.8, hip -0.7  . Esi  09/2011    ([place], [place])  . Colonoscopy w/ polypectomy    . Hand surgery  01/23/2002     Three fingers amputated.  Lawnmower   accident.  . Lumbar laminectomy/decompression microdiscectomy Right 11/06/2012    LUMBAR LAMINECTOMY/DECOMPRESSION MICRODISCECTOMY 1 LEVEL;  Surgeon: Eustace Moore, MD;  Right lumbar three-four extraforaminal diskectomy  . Colonoscopy  02/2014    diverticulosis, int hem, rpt 5 yrs Vira Agar)    Current Outpatient Rx  Name  Route  Sig  Dispense  Refill  . aspirin 81 MG tablet   Oral   Take 81 mg by mouth daily.         . cetirizine (ZYRTEC ALLERGY) 10 MG tablet   Oral   Take 10 mg by mouth daily.           . cholecalciferol (VITAMIN D) 1000 UNITS tablet   Oral   Take  1,000 Units by mouth daily.          Marland Kitchen docusate sodium (COLACE) 100 MG capsule   Oral   Take 200 mg by mouth daily.          Marland Kitchen ezetimibe (ZETIA) 10 MG tablet   Oral   Take 1 tablet (10 mg total) by mouth daily.   30 tablet   5   . glucose blood (ONE TOUCH ULTRA TEST) test strip   Other   1 each by Other route daily. Use to check sugar daily Dx: E11.9 **ONE TOUCH ULTRA BLUE**   100 each   3   . metFORMIN (GLUCOPHAGE) 1000 MG tablet      TAKE 1 TABLET BY MOUTH 2 TIMES DAILY WITH A MEAL   180 tablet   3   . Multiple Vitamin (MULTIVITAMIN)  tablet   Oral   Take 1 tablet by mouth daily.         Glory Rosebush DELICA LANCETS 34L MISC      USE ONE LANCET ONCE DAILY AS DIRECTED   100 each   0   . ranitidine (ZANTAC) 150 MG tablet   Oral   Take 150 mg by mouth 2 (two) times daily.          . sertraline (ZOLOFT) 25 MG tablet      TAKE ONE TABLET BY MOUTH EVERY DAY   30 tablet   11     PLEASE SEND REFILLS FOR NEXT FILLTHANK YOU   . simvastatin (ZOCOR) 80 MG tablet   Oral   Take 1 tablet (80 mg total) by mouth daily.   30 tablet   5     Allergies Shellfish allergy; Azithromycin; Meperidine hcl; Neurontin; and Omeprazole  Family History  Problem Relation Age of Onset  . Heart disease Mother 32    MI  . COPD Father     emphysema  . Cancer Father     esophageal and brain cancer  . Heart disease Father 60    CABG  . Alcohol abuse Sister 55    etoh  . Heart disease Brother 25    MI    Social History History  Substance Use Topics  . Smoking status: Never Smoker   . Smokeless tobacco: Never Used  . Alcohol Use: 0.0 oz/week    0 Standard drinks or equivalent per week     Comment: rarely - wine    Review of Systems Constitutional: No fever/chills Eyes: No visual changes. ENT: No sore throat. Cardiovascular: Denies chest pain. Respiratory: Denies shortness of breath. Gastrointestinal: No abdominal pain.  No nausea, no vomiting.  No diarrhea.  No constipation. Genitourinary: As above Musculoskeletal: Negative for back pain. Skin: Negative for rash. Neurological: Negative for headaches, focal weakness or numbness.  10-point ROS otherwise negative.  ____________________________________________   PHYSICAL EXAM:  VITAL SIGNS: ED Triage Vitals  Enc Vitals Group     BP 03/10/15 1952 124/80 mmHg     Pulse Rate 03/10/15 1952 83     Resp 03/10/15 1952 18     Temp 03/10/15 1952 98.2 F (36.8 C)     Temp Source 03/10/15 1952 Oral     SpO2 03/10/15 1952 98 %     Weight 03/10/15 1952 155 lb  (70.308 kg)     Height 03/10/15 1952 5\' 4"  (1.626 m)     Head Cir --      Peak Flow --      Pain Score 03/10/15  1952 1     Pain Loc --      Pain Edu? --      Excl. in [place]? --     Constitutional: Alert and oriented. Well appearing and in no acute distress. Eyes: Conjunctivae are normal. PERRL. EOMI. Head: Atraumatic. Nose: No congestion/rhinnorhea. Mouth/Throat: Mucous membranes are moist.  Oropharynx non-erythematous. Neck: No stridor.   Cardiovascular: Normal rate, regular rhythm. Grossly normal heart sounds.  Good peripheral circulation. Respiratory: Normal respiratory effort.  No retractions. Lungs CTAB. Gastrointestinal: Soft and nontender. No distention. No abdominal bruits. No CVA tenderness. Musculoskeletal: No lower extremity tenderness nor edema.  No joint effusions. Neurologic:  Normal speech and language. No gross focal neurologic deficits are appreciated. Speech is normal. No gait instability. Skin:  Skin is warm, dry and intact. No rash noted. Psychiatric: Mood and affect are normal. Speech and behavior are normal.  ____________________________________________   LABS (all labs ordered are listed, but only abnormal results are displayed)  Labs Reviewed  URINALYSIS COMPLETEWITH MICROSCOPIC ([place]) - Abnormal; Notable for the following:    Color, Urine STRAW (*)    APPearance CLEAR (*)    Specific Gravity, Urine 1.001 (*)    Hgb urine dipstick 3+ (*)    Leukocytes, UA 2+ (*)    Bacteria, UA RARE (*)    Squamous Epithelial / LPF 0-5 (*)    All other components within normal limits  GLUCOSE, CAPILLARY - Abnormal; Notable for the following:    Glucose-Capillary 109 (*)    All other components within normal limits  URINE CULTURE    ____________________________________________  EKG   ____________________________________________  RADIOLOGY   ____________________________________________   PROCEDURES    ____________________________________________   INITIAL IMPRESSION / ASSESSMENT AND PLAN / ED COURSE  Pertinent labs & imaging results that were available during my care of the patient were reviewed by me and considered in my medical decision making (see chart for details).  History, physical and labs consistent with urinary tract infection. We'll treat for extended time frame if mild pileup because of low back pain. Patient is well-appearing and glucose is well-controlled. ____________________________________________   FINAL CLINICAL IMPRESSION(S) / ED DIAGNOSES  Acute pyelonephritis. Initial visit.    Orbie Pyo, MD 03/10/15 [id number]

## 2015-03-11 ENCOUNTER — Other Ambulatory Visit: Payer: Self-pay | Admitting: *Deleted

## 2015-03-11 MED ORDER — SIMVASTATIN 80 MG PO TABS: 80.0000 mg | ORAL_TABLET | Freq: Every day | ORAL | Status: DC

## 2015-03-11 MED ORDER — EZETIMIBE 10 MG PO TABS: 10.0000 mg | ORAL_TABLET | Freq: Every day | ORAL | Status: DC

## 2015-03-12 LAB — URINE CULTURE

## 2015-03-18 DIAGNOSIS — E119 Type 2 diabetes mellitus without complications: Secondary | ICD-10-CM | POA: Diagnosis not present

## 2015-03-19 ENCOUNTER — Ambulatory Visit (INDEPENDENT_AMBULATORY_CARE_PROVIDER_SITE_OTHER): Payer: Medicare Other | Admitting: Internal Medicine

## 2015-03-19 ENCOUNTER — Other Ambulatory Visit: Payer: Self-pay | Admitting: Family Medicine

## 2015-03-19 ENCOUNTER — Encounter: Payer: Self-pay | Admitting: Internal Medicine

## 2015-03-19 VITALS — BP 126/68 | HR 77 | Temp 98.0°F | Wt 156.0 lb

## 2015-03-19 DIAGNOSIS — M65312 Trigger thumb, left thumb: Secondary | ICD-10-CM

## 2015-03-19 DIAGNOSIS — N390 Urinary tract infection, site not specified: Secondary | ICD-10-CM | POA: Diagnosis not present

## 2015-03-19 LAB — POCT URINALYSIS DIPSTICK
BILIRUBIN UA: NEGATIVE
Glucose, UA: NEGATIVE
Ketones, UA: NEGATIVE
Nitrite, UA: NEGATIVE
Protein, UA: NEGATIVE
RBC UA: NEGATIVE
SPEC GRAV UA: 1.025
Urobilinogen, UA: NEGATIVE
pH, UA: 6

## 2015-03-19 NOTE — Progress Notes (Signed)
HPI  Pt presents to the clinic today for hospital follow up for UTI. She was seen there 7/6. Urinalysis appeared positive for infection so she was started on Keflex x 10 days. The urine culture show multiple growths with not one in particular that dominated, the suggested recollection. She reports she has not yet finished all the antibiotics. She does still feel a little fatigued. Her low back pain has improved but she also has a history of back pain.   Review of Systems  Past Medical History  Diagnosis Date  . Diabetes mellitus     typeII  . Hyperlipidemia 05/1995  . Diverticulosis of colon 09/2003    colonoscopy ext.and internal hemorrhoids (Dr. Vira Agar )12/03/2006// colonoscopy polyps multiple  benign; interal hemorrhoids ,divertics 09/16/2003  . GERD (gastroesophageal reflux disease)     EGD hiatal hernia; esophagitis/sigmoidoscopy WNL (Dr. Vira Agar) 03/04/1996: ABD. U/S  WNL (06/1996)//EGD multiple polyps ;small H.H (09/16/2003)  . Chest pain 02/15-16/2009    Hospital [facility]  chest pain rule out //stress Myoview negative 10/21/2007//Stress cardiolite ;apical thinning ,negative ischemia (09/14/2003)  . Osteopenia 12/2011    spine -1.8, hip -0.7  . Right lumbar radiculitis 2013    DDD, spondylosis, L3 radiculitis (MRI 07/2012) - ESI 09/2011 (Dr Sharlet Salina)   . H/O hiatal hernia   . PONV (postoperative nausea and vomiting)   . Vertigo     hx of  . Environmental allergies   . Drug-induced constipation     Family History  Problem Relation Age of Onset  . Heart disease Mother 10    MI  . COPD Father     emphysema  . Cancer Father     esophageal and brain cancer  . Heart disease Father 28    CABG  . Alcohol abuse Sister 29    etoh  . Heart disease Brother 75    MI    History   Social History  . Marital Status: Married    Spouse Name: N/A  . Number of Children: N/A  . Years of Education: N/A   Occupational History  . Not on file.   Social History Main Topics  . Smoking  status: Never Smoker   . Smokeless tobacco: Never Used  . Alcohol Use: 0.0 oz/week    0 Standard drinks or equivalent per week     Comment: rarely - wine  . Drug Use: No  . Sexual Activity: No   Other Topics Concern  . Not on file   Social History Narrative   Lives with husband - takes care of him (h/o FTD).   Occ: Works part time at daycare   Activity: no regular exercise   Diet: good water, fruits/vegetables daily    Allergies  Allergen Reactions  . Shellfish Allergy Anaphylaxis  . Azithromycin     REACTION: rash  . Meperidine Hcl     REACTION: nausea  . Neurontin [Gabapentin] Other (See Comments)    Incoherent  . Omeprazole     REACTION: rash ([facility] brand)    Constitutional: Pt reports fatigue. Denies fever, malaise, headache or abrupt weight changes.   GU: Denies urgency, frequency, dysuria, burning sensation, blood in urine, odor or discharge. Skin: Denies redness, rashes, lesions or ulcercations.   No other specific complaints in a complete review of systems (except as listed in HPI above).    Objective:   Physical Exam  BP 126/68 mmHg  Pulse 77  Temp(Src) 98 F (36.7 C) (Oral)  Wt 156 lb (70.761 kg)  SpO2 98% Wt Readings from Last 3 Encounters:  03/19/15 156 lb (70.761 kg)  03/10/15 155 lb (70.308 kg)  02/24/15 153 lb 4 oz (69.514 kg)    General: Appears her stated age, well developed, well nourished in NAD. Cardiovascular: Normal rate and rhythm. S1,S2 noted.  No murmur, rubs or gallops noted. No JVD or BLE edema. No carotid bruits noted. Pulmonary/Chest: Normal effort and positive vesicular breath sounds. No respiratory distress. No wheezes, rales or ronchi noted.  Abdomen: Soft and nontender. No CVA tenderness.      Assessment & Plan:   Hospital follow up for UTI  Hospital notes and labs reviewed Urinalysis: 3+ leuks Will send urine cultrue Will have her finish her course of Keflex, and if urine culture comes back showing predominate growth,  we will call in additional antibiotics. OK to take AZO OTC Drink plenty of fluids  RTC as needed or if symptoms persist.

## 2015-03-19 NOTE — Progress Notes (Signed)
Pre visit review using our clinic review tool, if applicable. No additional management support is needed unless otherwise documented below in the visit note. 

## 2015-03-19 NOTE — Patient Instructions (Signed)

## 2015-03-21 LAB — URINE CULTURE: Colony Count: 6000

## 2015-03-22 ENCOUNTER — Other Ambulatory Visit: Payer: Self-pay | Admitting: Family Medicine

## 2015-03-23 ENCOUNTER — Telehealth: Payer: Self-pay | Admitting: Family Medicine

## 2015-03-23 NOTE — Telephone Encounter (Signed)
Pt received call, please call back on cell number, thanks.

## 2015-04-05 LAB — HM DIABETES EYE EXAM

## 2015-04-05 LAB — HM MAMMOGRAPHY: HM MAMMO: NORMAL

## 2015-04-13 ENCOUNTER — Other Ambulatory Visit: Payer: Self-pay | Admitting: Orthopedic Surgery

## 2015-04-13 DIAGNOSIS — M65312 Trigger thumb, left thumb: Secondary | ICD-10-CM | POA: Diagnosis not present

## 2015-04-14 NOTE — H&P (Signed)
Sarah Velasquez is an 69 y.o. female.   CC / Reason for Visit: Left trigger thumb HPI: This patient is a 69 year old, diabetic, female who reports that she has had a trigger thumb the last 2 months.  She reports that she's had 2 injections by Dr. Lorelei Pont and that her thumb is still triggering.  He refers her to [place] today for further evaluation and management.  Past Medical History  Diagnosis Date  . Diabetes mellitus     typeII  . Hyperlipidemia 05/1995  . Diverticulosis of colon 09/2003    colonoscopy ext.and internal hemorrhoids (Dr. Vira Agar )12/03/2006// colonoscopy polyps multiple  benign; interal hemorrhoids ,divertics 09/16/2003  . GERD (gastroesophageal reflux disease)     EGD hiatal hernia; esophagitis/sigmoidoscopy WNL (Dr. Vira Agar) 03/04/1996: ABD. U/S  WNL (06/1996)//EGD multiple polyps ;small H.H (09/16/2003)  . Chest pain 02/15-16/2009    Hospital [facility]  chest pain rule out //stress Myoview negative 10/21/2007//Stress cardiolite ;apical thinning ,negative ischemia (09/14/2003)  . Osteopenia 12/2011    spine -1.8, hip -0.7  . Right lumbar radiculitis 2013    DDD, spondylosis, L3 radiculitis (MRI 07/2012) - ESI 09/2011 (Dr Sharlet Salina)   . H/O hiatal hernia   . PONV (postoperative nausea and vomiting)   . Vertigo     hx of  . Environmental allergies   . Drug-induced constipation     Past Surgical History  Procedure Laterality Date  . Abdominal hysterectomy  1986    ovaries intact Uterine prolapse  . Nasal sinus surgery  1978  . Vaginal delivery      NSVD x 2  . Tubal ligation    . Dexa  12/2011    spine -1.8, hip -0.7  . Esi  09/2011    ([place]nis, [place])  . Colonoscopy w/ polypectomy    . Hand surgery  01/23/2002     Three fingers amputated.  Lawnmower   accident.  . Lumbar laminectomy/decompression microdiscectomy Right 11/06/2012    LUMBAR LAMINECTOMY/DECOMPRESSION MICRODISCECTOMY 1 LEVEL;  Surgeon: Eustace Moore, MD;  Right lumbar three-four extraforaminal diskectomy  .  Colonoscopy  02/2014    diverticulosis, int hem, rpt 5 yrs Vira Agar)    Family History  Problem Relation Age of Onset  . Heart disease Mother 45    MI  . COPD Father     emphysema  . Cancer Father     esophageal and brain cancer  . Heart disease Father 63    CABG  . Alcohol abuse Sister 67    etoh  . Heart disease Brother 48    MI   Social History:  reports that she has never smoked. She has never used smokeless tobacco. She reports that she drinks alcohol. She reports that she does not use illicit drugs.  Allergies:  Allergies  Allergen Reactions  . Shellfish Allergy Anaphylaxis  . Azithromycin     REACTION: rash  . Meperidine Hcl     REACTION: nausea  . Neurontin [Gabapentin] Other (See Comments)    Incoherent  . Omeprazole     REACTION: rash ([facility] brand)    No prescriptions prior to admission    No results found for this or any previous visit (from the past 48 hour(s)). No results found.  Review of Systems  All other systems reviewed and are negative.   There were no vitals taken for this visit. Physical Exam  Constitutional:  WD, WN, NAD HEENT:  NCAT, EOMI Neuro/Psych:  Alert & oriented to person, place, and time; appropriate  mood & affect Lymphatic: No generalized UE edema or lymphadenopathy Extremities / MSK:  Both UE are normal with respect to appearance, ranges of motion, joint stability, muscle strength/tone, sensation, & perfusion except as otherwise noted:  No swelling, or discoloration of the left thumb.  Active triggering with each flexion cycle.  Pain over the A1 pulley with palpation. NVI.    Labs / Xrays: No radiographic studies obtained today.  Assessment:  Left trigger thumb  Plan:  Findings were discussed with the patient.  She agrees to move forward with trigger thumb release, likely next week.  We discussed the difficulty of proceeding under straight local, without MAC as the tourniquet can sometimes be quite uncomfortable.  The  details of the operative procedure were discussed with the patient.  Questions were invited and answered.  In addition to the goal of the procedure, the risks of the procedure to include but not limited to bleeding; infection; damage to the nerves or blood vessels that could result in bleeding, numbness, weakness, chronic pain, and the need for additional procedures; stiffness; the need for revision surgery; and anesthetic risks were reviewed.  No specific outcome was guaranteed or implied.  Informed consent was obtained.  RTC 10-15 days after surgery.  [name] A. 04/14/2015, 12:20 PM

## 2015-04-19 ENCOUNTER — Encounter (HOSPITAL_BASED_OUTPATIENT_CLINIC_OR_DEPARTMENT_OTHER): Payer: Self-pay | Admitting: *Deleted

## 2015-04-19 ENCOUNTER — Encounter (HOSPITAL_BASED_OUTPATIENT_CLINIC_OR_DEPARTMENT_OTHER)
Admission: RE | Admit: 2015-04-19 | Discharge: 2015-04-19 | Disposition: A | Discharge status: Home / Self Care | Payer: Medicare Other | Source: Ambulatory Visit | Attending: Orthopedic Surgery | Admitting: Orthopedic Surgery

## 2015-04-19 DIAGNOSIS — E785 Hyperlipidemia, unspecified: Secondary | ICD-10-CM | POA: Diagnosis not present

## 2015-04-19 DIAGNOSIS — G709 Myoneural disorder, unspecified: Secondary | ICD-10-CM | POA: Diagnosis not present

## 2015-04-19 DIAGNOSIS — E119 Type 2 diabetes mellitus without complications: Secondary | ICD-10-CM | POA: Diagnosis not present

## 2015-04-19 DIAGNOSIS — K449 Diaphragmatic hernia without obstruction or gangrene: Secondary | ICD-10-CM | POA: Diagnosis not present

## 2015-04-19 DIAGNOSIS — M1990 Unspecified osteoarthritis, unspecified site: Secondary | ICD-10-CM | POA: Diagnosis not present

## 2015-04-19 DIAGNOSIS — K219 Gastro-esophageal reflux disease without esophagitis: Secondary | ICD-10-CM | POA: Diagnosis not present

## 2015-04-19 DIAGNOSIS — M65312 Trigger thumb, left thumb: Secondary | ICD-10-CM | POA: Diagnosis not present

## 2015-04-19 LAB — BASIC METABOLIC PANEL
Anion gap: 9 (ref 5–15)
BUN: 7 mg/dL (ref 6–20)
CO2: 26 mmol/L (ref 22–32)
CREATININE: 0.92 mg/dL (ref 0.44–1.00)
Calcium: 9.6 mg/dL (ref 8.9–10.3)
Chloride: 105 mmol/L (ref 101–111)
GFR calc non Af Amer: >60 mL/min (ref >60)
Glucose, Bld: 149 mg/dL — ABNORMAL HIGH (ref 65–99)
Potassium: 3.9 mmol/L (ref 3.5–5.1)
SODIUM: 140 mmol/L (ref 135–145)

## 2015-04-20 ENCOUNTER — Encounter (HOSPITAL_BASED_OUTPATIENT_CLINIC_OR_DEPARTMENT_OTHER): Payer: Self-pay | Admitting: *Deleted

## 2015-04-20 ENCOUNTER — Ambulatory Visit (HOSPITAL_BASED_OUTPATIENT_CLINIC_OR_DEPARTMENT_OTHER): Payer: Medicare Other | Admitting: Anesthesiology

## 2015-04-20 ENCOUNTER — Encounter (HOSPITAL_BASED_OUTPATIENT_CLINIC_OR_DEPARTMENT_OTHER)
Admission: RE | Disposition: A | Discharge status: Home / Self Care | Payer: Self-pay | Source: Ambulatory Visit | Attending: Orthopedic Surgery

## 2015-04-20 ENCOUNTER — Ambulatory Visit (HOSPITAL_BASED_OUTPATIENT_CLINIC_OR_DEPARTMENT_OTHER)
Admission: RE | Admit: 2015-04-20 | Discharge: 2015-04-20 | Disposition: A | Discharge status: Home / Self Care | Payer: Medicare Other | Source: Ambulatory Visit | Attending: Orthopedic Surgery | Admitting: Orthopedic Surgery

## 2015-04-20 DIAGNOSIS — M1990 Unspecified osteoarthritis, unspecified site: Secondary | ICD-10-CM | POA: Insufficient documentation

## 2015-04-20 DIAGNOSIS — G709 Myoneural disorder, unspecified: Secondary | ICD-10-CM | POA: Insufficient documentation

## 2015-04-20 DIAGNOSIS — E119 Type 2 diabetes mellitus without complications: Secondary | ICD-10-CM | POA: Insufficient documentation

## 2015-04-20 DIAGNOSIS — E785 Hyperlipidemia, unspecified: Secondary | ICD-10-CM | POA: Insufficient documentation

## 2015-04-20 DIAGNOSIS — M65312 Trigger thumb, left thumb: Secondary | ICD-10-CM | POA: Diagnosis not present

## 2015-04-20 DIAGNOSIS — K219 Gastro-esophageal reflux disease without esophagitis: Secondary | ICD-10-CM | POA: Diagnosis not present

## 2015-04-20 DIAGNOSIS — K449 Diaphragmatic hernia without obstruction or gangrene: Secondary | ICD-10-CM | POA: Insufficient documentation

## 2015-04-20 HISTORY — PX: TRIGGER FINGER RELEASE: SHX641

## 2015-04-20 HISTORY — DX: Anxiety disorder, unspecified: F41.9

## 2015-04-20 LAB — POCT HEMOGLOBIN-HEMACUE: HEMOGLOBIN: 14.3 g/dL (ref 12.0–15.0)

## 2015-04-20 LAB — GLUCOSE, CAPILLARY
GLUCOSE-CAPILLARY: 120 mg/dL — AB (ref 65–99)
Glucose-Capillary: 107 mg/dL — ABNORMAL HIGH (ref 65–99)

## 2015-04-20 SURGERY — RELEASE, A1 PULLEY, FOR TRIGGER FINGER
Anesthesia: Monitor Anesthesia Care | Site: Thumb | Laterality: Left

## 2015-04-20 MED ORDER — LACTATED RINGERS IV SOLN: INTRAVENOUS | Status: DC

## 2015-04-20 MED ORDER — ONDANSETRON HCL 4 MG/2ML IJ SOLN
INTRAMUSCULAR | Status: DC | PRN
  Administered 2015-04-20: 4 mg via INTRAVENOUS

## 2015-04-20 MED ORDER — HYDROMORPHONE HCL 1 MG/ML IJ SOLN: 0.2500 mg | INTRAMUSCULAR | Status: DC | PRN

## 2015-04-20 MED ORDER — CEFAZOLIN SODIUM-DEXTROSE 2-3 GM-% IV SOLR
INTRAVENOUS | Status: AC
  Filled 2015-04-20: qty 50

## 2015-04-20 MED ORDER — OXYCODONE HCL 5 MG/5ML PO SOLN: 5.0000 mg | Freq: Once | ORAL | Status: DC | PRN

## 2015-04-20 MED ORDER — FENTANYL CITRATE (PF) 100 MCG/2ML IJ SOLN
INTRAMUSCULAR | Status: AC
  Filled 2015-04-20: qty 2

## 2015-04-20 MED ORDER — MIDAZOLAM HCL 2 MG/2ML IJ SOLN: 1.0000 mg | INTRAMUSCULAR | Status: DC | PRN

## 2015-04-20 MED ORDER — PROMETHAZINE HCL 25 MG/ML IJ SOLN
6.2500 mg | INTRAMUSCULAR | Status: DC | PRN
Start: 2015-04-20 — End: 2015-04-20

## 2015-04-20 MED ORDER — GLYCOPYRROLATE 0.2 MG/ML IJ SOLN: 0.2000 mg | Freq: Once | INTRAMUSCULAR | Status: DC | PRN

## 2015-04-20 MED ORDER — SCOPOLAMINE 1 MG/3DAYS TD PT72: 1.0000 | MEDICATED_PATCH | Freq: Once | TRANSDERMAL | Status: DC | PRN

## 2015-04-20 MED ORDER — BUPIVACAINE-EPINEPHRINE 0.5% -1:200000 IJ SOLN
INTRAMUSCULAR | Status: DC | PRN
  Administered 2015-04-20: 2 mL

## 2015-04-20 MED ORDER — LACTATED RINGERS IV SOLN
INTRAVENOUS | Status: DC
  Administered 2015-04-20: 10 mL/h via INTRAVENOUS

## 2015-04-20 MED ORDER — TRAMADOL HCL 50 MG PO TABS: 50.0000 mg | ORAL_TABLET | Freq: Four times a day (QID) | ORAL | Status: DC | PRN

## 2015-04-20 MED ORDER — OXYCODONE HCL 5 MG PO TABS: 5.0000 mg | ORAL_TABLET | Freq: Once | ORAL | Status: DC | PRN

## 2015-04-20 MED ORDER — LIDOCAINE HCL 2 % IJ SOLN
INTRAMUSCULAR | Status: DC | PRN
  Administered 2015-04-20: 2 mL

## 2015-04-20 MED ORDER — LIDOCAINE HCL (CARDIAC) 20 MG/ML IV SOLN
INTRAVENOUS | Status: DC | PRN
  Administered 2015-04-20: 60 mg via INTRAVENOUS

## 2015-04-20 MED ORDER — FENTANYL CITRATE (PF) 100 MCG/2ML IJ SOLN
50.0000 ug | INTRAMUSCULAR | Status: DC | PRN
  Administered 2015-04-20: 50 ug via INTRAVENOUS

## 2015-04-20 MED ORDER — BUPIVACAINE-EPINEPHRINE (PF) 0.5% -1:200000 IJ SOLN
INTRAMUSCULAR | Status: AC
  Filled 2015-04-20: qty 30

## 2015-04-20 MED ORDER — CEFAZOLIN SODIUM-DEXTROSE 2-3 GM-% IV SOLR
2.0000 g | INTRAVENOUS | Status: AC
  Administered 2015-04-20: 2 g via INTRAVENOUS

## 2015-04-20 MED ORDER — PROPOFOL INFUSION 10 MG/ML OPTIME
INTRAVENOUS | Status: DC | PRN
  Administered 2015-04-20: 75 ug/kg/min via INTRAVENOUS

## 2015-04-20 MED ORDER — LIDOCAINE HCL 2 % IJ SOLN
INTRAMUSCULAR | Status: AC
  Filled 2015-04-20: qty 20

## 2015-04-20 SURGICAL SUPPLY — 40 items
BLADE MINI RND TIP GREEN BEAV (BLADE) IMPLANT
BLADE SURG 15 STRL LF DISP TIS (BLADE) ×1 IMPLANT
BLADE SURG 15 STRL SS (BLADE) ×2
BNDG COHESIVE 1X5 TAN STRL LF (GAUZE/BANDAGES/DRESSINGS) IMPLANT
BNDG CONFORM 2 STRL LF (GAUZE/BANDAGES/DRESSINGS) ×3 IMPLANT
BNDG ESMARK 4X9 LF (GAUZE/BANDAGES/DRESSINGS) ×3 IMPLANT
CHLORAPREP W/TINT 26ML (MISCELLANEOUS) ×3 IMPLANT
COVER BACK TABLE 60X90IN (DRAPES) ×3 IMPLANT
COVER MAYO STAND STRL (DRAPES) ×3 IMPLANT
CUFF TOURNIQUET SINGLE 18IN (TOURNIQUET CUFF) IMPLANT
DRAPE EXTREMITY T 121X128X90 (DRAPE) ×3 IMPLANT
DRAPE SURG 17X23 STRL (DRAPES) ×3 IMPLANT
DRSG EMULSION OIL 3X3 NADH (GAUZE/BANDAGES/DRESSINGS) ×3 IMPLANT
GLOVE BIO SURGEON STRL SZ7.5 (GLOVE) ×3 IMPLANT
GLOVE BIOGEL PI IND STRL 7.0 (GLOVE) ×2 IMPLANT
GLOVE BIOGEL PI IND STRL 8 (GLOVE) ×1 IMPLANT
GLOVE BIOGEL PI INDICATOR 7.0 (GLOVE) ×4
GLOVE BIOGEL PI INDICATOR 8 (GLOVE) ×2
GLOVE ECLIPSE 6.5 STRL STRAW (GLOVE) ×6 IMPLANT
GOWN STRL REUS W/ TWL LRG LVL3 (GOWN DISPOSABLE) ×2 IMPLANT
GOWN STRL REUS W/TWL LRG LVL3 (GOWN DISPOSABLE) ×4
GOWN STRL REUS W/TWL XL LVL3 (GOWN DISPOSABLE) ×3 IMPLANT
NDL SAFETY ECLIPSE 18X1.5 (NEEDLE) ×1 IMPLANT
NEEDLE HYPO 18GX1.5 SHARP (NEEDLE) ×2
NEEDLE HYPO 25X1 1.5 SAFETY (NEEDLE) ×3 IMPLANT
NS IRRIG 1000ML POUR BTL (IV SOLUTION) ×3 IMPLANT
PACK BASIN DAY SURGERY FS (CUSTOM PROCEDURE TRAY) ×3 IMPLANT
PADDING CAST ABS 4INX4YD NS (CAST SUPPLIES)
PADDING CAST ABS COTTON 4X4 ST (CAST SUPPLIES) IMPLANT
PADDING CAST COTTON 2X4 NS (CAST SUPPLIES) IMPLANT
SPONGE GAUZE 4X4 12PLY STER LF (GAUZE/BANDAGES/DRESSINGS) ×3 IMPLANT
STOCKINETTE 6  STRL (DRAPES) ×2
STOCKINETTE 6 STRL (DRAPES) ×1 IMPLANT
SUT VICRYL RAPIDE 4-0 (SUTURE) IMPLANT
SUT VICRYL RAPIDE 4/0 PS 2 (SUTURE) IMPLANT
SYR BULB 3OZ (MISCELLANEOUS) ×3 IMPLANT
SYRINGE 10CC LL (SYRINGE) ×3 IMPLANT
TOWEL OR 17X24 6PK STRL BLUE (TOWEL DISPOSABLE) ×3 IMPLANT
TOWEL OR NON WOVEN STRL DISP B (DISPOSABLE) IMPLANT
UNDERPAD 30X30 (UNDERPADS AND DIAPERS) ×3 IMPLANT

## 2015-04-20 NOTE — Anesthesia Postprocedure Evaluation (Signed)
Anesthesia Post Note  Patient: Sarah Velasquez  Procedure(s) Performed: Procedure(s) (LRB): LEFT THUMB TRIGGER FINGER RELEASE (Left)  Anesthesia type: MAC  Patient location: PACU  Post pain: Pain level controlled  Post assessment: Post-op Vital signs reviewed  Last Vitals: BP 124/60 mmHg  Pulse 70  Temp(Src) 36.5 C (Oral)  Resp 16  Ht 5\' 4"  (1.626 m)  Wt 160 lb (72.576 kg)  BMI 27.45 kg/m2  SpO2 100%  Post vital signs: Reviewed  Level of consciousness: awake  Complications: No apparent anesthesia complications

## 2015-04-20 NOTE — Anesthesia Procedure Notes (Signed)
Procedure Name: MAC Date/Time: 04/20/2015 2:55 PM Performed by: Chanteria Haggard D Pre-anesthesia Checklist: Patient identified, Emergency Drugs available, Suction available, Patient being monitored and Timeout performed Patient Re-evaluated:Patient Re-evaluated prior to inductionOxygen Delivery Method: Simple face mask

## 2015-04-20 NOTE — Discharge Instructions (Signed)
Discharge Instructions   You have a light dressing on your hand.  You may begin gentle motion of your fingers and hand immediately, but you should not do any heavy lifting or gripping.  Elevate your hand to reduce pain & swelling of the digits.  Ice over the operative site may be helpful to reduce pain & swelling.  DO NOT USE HEAT. Pain medicine has been prescribed for you.  Use your medicine as needed over the first 48 hours, and then you can begin to taper your use. You may use Tylenol in place of your prescribed pain medication, but not IN ADDITION to it. Leave the dressing in place until the third day after your surgery and then remove it, leaving it open to air.  After the bandage has been removed you may shower, regularly washing the incision and letting the water run over it, but not submerging it (no swimming, soaking it in dishwater, etc.) You may drive a car when you are off of prescription pain medications and can safely control your vehicle with both hands. We will address whether therapy will be required or not when you return to the office. You may have already made your follow-up appointment when we completed your preop visit.  If not, please call our office today or the next business day to make your return appointment for 10-15 days after surgery.   Please call [phone] during normal business hours or [phone] after hours for any problems. Including the following:  - excessive redness of the incisions - drainage for more than 4 days - fever of more than 101.5 F  *Please note that pain medications will not be refilled after hours or on weekends.    Post Anesthesia Home Care Instructions  Activity: Get plenty of rest for the remainder of the day. A responsible adult should stay with you for 24 hours following the procedure.  For the next 24 hours, DO NOT: -Drive a car -Paediatric nurse -Drink alcoholic beverages -Take any medication unless instructed by your  physician -Make any legal decisions or sign important papers.  Meals: Start with liquid foods such as gelatin or soup. Progress to regular foods as tolerated. Avoid greasy, spicy, heavy foods. If nausea and/or vomiting occur, drink only clear liquids until the nausea and/or vomiting subsides. Call your physician if vomiting continues.  Special Instructions/Symptoms: Your throat may feel dry or sore from the anesthesia or the breathing tube placed in your throat during surgery. If this causes discomfort, gargle with warm salt water. The discomfort should disappear within 24 hours.  If you had a scopolamine patch placed behind your ear for the management of post- operative nausea and/or vomiting:  1. The medication in the patch is effective for 72 hours, after which it should be removed.  Wrap patch in a tissue and discard in the trash. Wash hands thoroughly with soap and water. 2. You may remove the patch earlier than 72 hours if you experience unpleasant side effects which may include dry mouth, dizziness or visual disturbances. 3. Avoid touching the patch. Wash your hands with soap and water after contact with the patch.     Call your surgeon if you experience:   1.  Fever over 101.0. 2.  Inability to urinate. 3.  Nausea and/or vomiting. 4.  Extreme swelling or bruising at the surgical site. 5.  Continued bleeding from the incision. 6.  Increased pain, redness or drainage from the incision. 7.  Problems related to your pain medication.  8. Any change in color, movement and/or sensation 9. Any problems and/or concerns

## 2015-04-20 NOTE — Op Note (Signed)
04/20/2015  1:16 PM  PATIENT:  Sarah Velasquez  69 y.o. female  PRE-OPERATIVE DIAGNOSIS:  Left trigger thumb  POST-OPERATIVE DIAGNOSIS:  Same  PROCEDURE:  Left trigger thumb release  SURGEON: Rayvon Char. Grandville Silos, MD  PHYSICIAN ASSISTANT: Morley Kos, OPA-C  ANESTHESIA:  local and MAC  SPECIMENS:  None  DRAINS:   None  EBL:  less than 50 mL  PREOPERATIVE INDICATIONS:  [name] is a  69 y.o. female with left trigger thumb that responded only transiently to nonoperative management to include injection.  The risks benefits and alternatives were discussed with the patient preoperatively including but not limited to the risks of infection, bleeding, nerve injury, cardiopulmonary complications, the need for revision surgery, among others, and the patient verbalized understanding and consented to proceed.  OPERATIVE IMPLANTS: None  OPERATIVE PROCEDURE:  After receiving prophylactic antibiotics, the patient was escorted to the operative theatre and placed in a supine position.  A surgical "time-out" was performed during which the planned procedure, proposed operative site, and the correct patient identity were compared to the operative consent and agreement confirmed by the circulating nurse according to current facility policy. A mixture of lidocaine and Marcaine bearing epinephrine was instilled as a field block. Following application of a tourniquet to the operative extremity, the exposed skin was prepped with Chloraprep and draped in the usual sterile fashion.  The limb was exsanguinated with an Esmarch bandage and the tourniquet inflated to approximately 129mmHg higher than systolic BP.  A transverse incision was made at the base of the thumb. Subcutaneous taste tissues were dissected with spreading dissection with care to protect the digital nerves radially and ulnarly. The annular pulley was split with care to preserve the oblique pulley. Under the A1 pulley of the thumb, the tendon was  found to have longitudinal split tearing and this was debrided. The wound was irrigated, tourniquet released, and skin closed with 4-0 Vicryl Rapide interrupted sutures. A light dressing was applied.  DISPOSITION: She'll be discharged home to typical instructions returning in 10-15 days.

## 2015-04-20 NOTE — Anesthesia Preprocedure Evaluation (Signed)
Anesthesia Evaluation  Patient identified by MRN, date of birth, ID band Patient awake    Reviewed: Allergy & Precautions, H&P , NPO status , Patient's Chart, lab work & pertinent test results, reviewed documented beta blocker date and time   History of Anesthesia Complications (+) PONV and history of anesthetic complications  Airway Mallampati: I  TM Distance: >3 FB Neck ROM: full    Dental no notable dental hx. (+) Dental Advisory Given   Pulmonary  breath sounds clear to auscultation  Pulmonary exam normal       Cardiovascular Normal cardiovascular examRhythm:regular Rate:Normal     Neuro/Psych PSYCHIATRIC DISORDERS  Neuromuscular disease    GI/Hepatic hiatal hernia, GERD-  ,  Endo/Other  diabetes  Renal/GU      Musculoskeletal  (+) Arthritis -,   Abdominal   Peds  Hematology   Anesthesia Other Findings   Reproductive/Obstetrics                             Anesthesia Physical  Anesthesia Plan  ASA: II  Anesthesia Plan: MAC   Post-op Pain Management:    Induction: Intravenous  Airway Management Planned:   Additional Equipment:   Intra-op Plan:   Post-operative Plan:   Informed Consent: I have reviewed the patients History and Physical, chart, labs and discussed the procedure including the risks, benefits and alternatives for the proposed anesthesia with the patient or authorized representative who has indicated his/her understanding and acceptance.   Dental advisory given  Plan Discussed with: CRNA, Anesthesiologist and Surgeon  Anesthesia Plan Comments:         Anesthesia Quick Evaluation

## 2015-04-20 NOTE — Transfer of Care (Signed)
Immediate Anesthesia Transfer of Care Note  Patient: Sarah Velasquez  Procedure(s) Performed: Procedure(s): LEFT THUMB TRIGGER FINGER RELEASE (Left)  Patient Location: PACU  Anesthesia Type:MAC  Level of Consciousness: awake, alert , oriented and patient cooperative  Airway & Oxygen Therapy: Patient Spontanous Breathing and Patient connected to face mask oxygen  Post-op Assessment: Report given to RN and Post -op Vital signs reviewed and stable  Post vital signs: Reviewed and stable  Last Vitals:  Filed Vitals:   04/20/15 1321  BP: 122/66  Pulse: 73  Temp: 36.8 C  Resp: 18    Complications: No apparent anesthesia complications

## 2015-04-20 NOTE — Interval H&P Note (Signed)
History and Physical Interval Note:  04/20/2015 1:12 PM  Sarah Velasquez  has presented today for surgery, with the diagnosis of LEFT TRIGGER THUMB M65.312  The various methods of treatment have been discussed with the patient and family. After consideration of risks, benefits and other options for treatment, the patient has consented to  Procedure(s): [place] (Left) as a surgical intervention .  The patient's history has been reviewed, patient examined, no change in status, stable for surgery.  I have reviewed the patient's chart and labs.  Questions were answered to the patient's satisfaction.     Lamond Glantz A.

## 2015-04-21 ENCOUNTER — Encounter (HOSPITAL_BASED_OUTPATIENT_CLINIC_OR_DEPARTMENT_OTHER): Payer: Self-pay | Admitting: Orthopedic Surgery

## 2015-05-05 DIAGNOSIS — M65312 Trigger thumb, left thumb: Secondary | ICD-10-CM | POA: Diagnosis not present

## 2015-05-30 ENCOUNTER — Other Ambulatory Visit: Payer: Self-pay | Admitting: Family Medicine

## 2015-05-30 DIAGNOSIS — E782 Mixed hyperlipidemia: Secondary | ICD-10-CM

## 2015-05-30 DIAGNOSIS — E559 Vitamin D deficiency, unspecified: Secondary | ICD-10-CM

## 2015-05-30 DIAGNOSIS — D72829 Elevated white blood cell count, unspecified: Secondary | ICD-10-CM

## 2015-05-30 DIAGNOSIS — E119 Type 2 diabetes mellitus without complications: Secondary | ICD-10-CM

## 2015-05-30 DIAGNOSIS — M8580 Other specified disorders of bone density and structure, unspecified site: Secondary | ICD-10-CM

## 2015-06-03 ENCOUNTER — Other Ambulatory Visit (INDEPENDENT_AMBULATORY_CARE_PROVIDER_SITE_OTHER): Payer: Medicare Other

## 2015-06-03 DIAGNOSIS — E782 Mixed hyperlipidemia: Secondary | ICD-10-CM | POA: Diagnosis not present

## 2015-06-03 DIAGNOSIS — E559 Vitamin D deficiency, unspecified: Secondary | ICD-10-CM

## 2015-06-03 DIAGNOSIS — D72829 Elevated white blood cell count, unspecified: Secondary | ICD-10-CM | POA: Diagnosis not present

## 2015-06-03 DIAGNOSIS — E119 Type 2 diabetes mellitus without complications: Secondary | ICD-10-CM

## 2015-06-03 LAB — LIPID PANEL
CHOLESTEROL: 156 mg/dL (ref 0–200)
HDL: 54.8 mg/dL (ref >39.00)
LDL Cholesterol: 67 mg/dL (ref 0–99)
NonHDL: 101.11
Total CHOL/HDL Ratio: 3
Triglycerides: 170 mg/dL — ABNORMAL HIGH (ref 0.0–149.0)
VLDL: 34 mg/dL (ref 0.0–40.0)

## 2015-06-03 LAB — COMPREHENSIVE METABOLIC PANEL
ALBUMIN: 4.4 g/dL (ref 3.5–5.2)
ALK PHOS: 54 U/L (ref 39–117)
ALT: 12 U/L (ref 0–35)
AST: 18 U/L (ref 0–37)
BUN: 10 mg/dL (ref 6–23)
CO2: 28 mEq/L (ref 19–32)
CREATININE: 0.89 mg/dL (ref 0.40–1.20)
Calcium: 9.9 mg/dL (ref 8.4–10.5)
Chloride: 106 mEq/L (ref 96–112)
GFR: 66.81 mL/min (ref >60.00)
Glucose, Bld: 133 mg/dL — ABNORMAL HIGH (ref 70–99)
Potassium: 4 mEq/L (ref 3.5–5.1)
SODIUM: 142 meq/L (ref 135–145)
TOTAL PROTEIN: 7.3 g/dL (ref 6.0–8.3)
Total Bilirubin: 0.7 mg/dL (ref 0.2–1.2)

## 2015-06-03 LAB — VITAMIN D 25 HYDROXY (VIT D DEFICIENCY, FRACTURES): VITD: 45.54 ng/mL (ref 30.00–100.00)

## 2015-06-03 LAB — CBC WITH DIFFERENTIAL/PLATELET
Basophils Absolute: 0.1 10*3/uL (ref 0.0–0.1)
Basophils Relative: 0.8 % (ref 0.0–3.0)
EOS PCT: 2.9 % (ref 0.0–5.0)
Eosinophils Absolute: 0.3 10*3/uL (ref 0.0–0.7)
HCT: 42 % (ref 36.0–46.0)
Hemoglobin: 13.9 g/dL (ref 12.0–15.0)
LYMPHS ABS: 2.9 10*3/uL (ref 0.7–4.0)
Lymphocytes Relative: 33.7 % (ref 12.0–46.0)
MCHC: 33.1 g/dL (ref 30.0–36.0)
MCV: 87.5 fl (ref 78.0–100.0)
MONO ABS: 0.7 10*3/uL (ref 0.1–1.0)
Monocytes Relative: 8 % (ref 3.0–12.0)
NEUTROS PCT: 54.6 % (ref 43.0–77.0)
Neutro Abs: 4.6 10*3/uL (ref 1.4–7.7)
Platelets: 254 10*3/uL (ref 150.0–400.0)
RBC: 4.8 Mil/uL (ref 3.87–5.11)
RDW: 13.9 % (ref 11.5–15.5)
WBC: 8.5 10*3/uL (ref 4.0–10.5)

## 2015-06-03 LAB — MICROALBUMIN / CREATININE URINE RATIO
Creatinine,U: 127.1 mg/dL
MICROALB UR: 2.1 mg/dL — AB (ref 0.0–1.9)
Microalb Creat Ratio: 1.7 mg/g (ref 0.0–30.0)

## 2015-06-03 LAB — HEMOGLOBIN A1C: HEMOGLOBIN A1C: 6.5 % (ref 4.6–6.5)

## 2015-06-11 ENCOUNTER — Encounter: Payer: Medicare Other | Admitting: Family Medicine

## 2015-06-14 ENCOUNTER — Ambulatory Visit (INDEPENDENT_AMBULATORY_CARE_PROVIDER_SITE_OTHER): Payer: Medicare Other | Admitting: Family Medicine

## 2015-06-14 ENCOUNTER — Encounter: Payer: Self-pay | Admitting: Family Medicine

## 2015-06-14 VITALS — BP 118/68 | HR 70 | Temp 98.2°F | Ht 64.3 in | Wt 157.5 lb

## 2015-06-14 DIAGNOSIS — E782 Mixed hyperlipidemia: Secondary | ICD-10-CM

## 2015-06-14 DIAGNOSIS — E119 Type 2 diabetes mellitus without complications: Secondary | ICD-10-CM

## 2015-06-14 DIAGNOSIS — Z0000 Encounter for general adult medical examination without abnormal findings: Secondary | ICD-10-CM

## 2015-06-14 DIAGNOSIS — Z23 Encounter for immunization: Secondary | ICD-10-CM

## 2015-06-14 DIAGNOSIS — Z7189 Other specified counseling: Secondary | ICD-10-CM | POA: Diagnosis not present

## 2015-06-14 DIAGNOSIS — E559 Vitamin D deficiency, unspecified: Secondary | ICD-10-CM | POA: Diagnosis not present

## 2015-06-14 DIAGNOSIS — M8580 Other specified disorders of bone density and structure, unspecified site: Secondary | ICD-10-CM

## 2015-06-14 DIAGNOSIS — Z636 Dependent relative needing care at home: Secondary | ICD-10-CM | POA: Diagnosis not present

## 2015-06-14 DIAGNOSIS — K219 Gastro-esophageal reflux disease without esophagitis: Secondary | ICD-10-CM

## 2015-06-14 NOTE — Progress Notes (Signed)
BP 118/68 mmHg  Pulse 70  Temp(Src) 98.2 F (36.8 C) (Oral)  Ht 5' 4.3" (1.633 m)  Wt 157 lb 8 oz (71.442 kg)  BMI 26.79 kg/m2  SpO2 97%   CC: medicare wellness visit  Subjective:    Patient ID: Sarah Velasquez, female    DOB: 04/07/1946, 69 y.o.   MRN: [id number]  HPI: Sarah Velasquez is a 69 y.o. female presenting on 06/14/2015 for Annual Exam   Desires to stop setraline. Stays stressed but manageable.  DM - well controlled on metformin 500mg  daily. Diabetic Foot Exam - Simple   Simple Foot Form  Diabetic Foot exam was performed with the following findings:  Yes 06/14/2015 12:22 PM  Visual Inspection  No deformities, no ulcerations, no other skin breakdown bilaterally:  Yes  Sensation Testing  Intact to touch and monofilament testing bilaterally:  Yes  Pulse Check  Posterior Tibialis and Dorsalis pulse intact bilaterally:  Yes  Comments      Hearing screen passed.  Recent vision screen by eye doctor (yearly). Denies depression/anhedonia or falls.  Preventative: COLONOSCOPY Date: 02/2014 diverticulosis, int hem, rpt 5 yrs Vira Agar) Well woman with OBGYN. S/p hysterectomy 1986, ovaries remain. Had pap smear done 12/2013. Mammo 09/2014 DEXA Date: 12/2011 spine -1.8, hip -0.7 Flu - today Pneumovax 2013, prevnar - today Td 2010  zostavax 08/2011 Advanced directives: has at home. Asked her to bring [place] a copy. Doesn't want prolonged life support if terminally ill. Son [name] is HCPOA.  Seat belt use discussed Sunscreen use discussed. No changing moles on skin.  Lives with husband - takes care of him (h/o FTD). Part time at daycare Activity: no regular exercise Diet: good water, fruits/vegetables daily  Relevant past medical, surgical, family and social history reviewed and updated as indicated. Interim medical history since our last visit reviewed. Allergies and medications reviewed and updated. Current Outpatient Prescriptions on File Prior to Visit  Medication Sig  . aspirin  81 MG tablet Take 81 mg by mouth daily.  . cetirizine (ZYRTEC ALLERGY) 10 MG tablet Take 10 mg by mouth daily.    Marland Kitchen docusate sodium (COLACE) 100 MG capsule Take 200 mg by mouth daily.   Marland Kitchen ezetimibe (ZETIA) 10 MG tablet Take 1 tablet (10 mg total) by mouth daily.  Marland Kitchen glucose blood (ONE TOUCH ULTRA TEST) test strip 1 each by Other route daily. Use to check sugar daily Dx: E11.9 **ONE TOUCH ULTRA BLUE**  . metFORMIN (GLUCOPHAGE) 1000 MG tablet TAKE 1 TABLET BY MOUTH 2 TIMES DAILY WITH A MEAL  . Multiple Vitamin (MULTIVITAMIN) tablet Take 1 tablet by mouth daily.  Glory Rosebush DELICA LANCETS 12Y MISC USE ONE LANCET ONCE DAILY AS DIRECTED  . ranitidine (ZANTAC) 150 MG tablet Take 150 mg by mouth 2 (two) times daily.   . simvastatin (ZOCOR) 80 MG tablet Take 1 tablet (80 mg total) by mouth daily.  . traMADol (ULTRAM) 50 MG tablet Take 1 tablet (50 mg total) by mouth every 6 (six) hours as needed.   No current facility-administered medications on file prior to visit.    Review of Systems Per HPI unless specifically indicated above     Objective:    BP 118/68 mmHg  Pulse 70  Temp(Src) 98.2 F (36.8 C) (Oral)  Ht 5' 4.3" (1.633 m)  Wt 157 lb 8 oz (71.442 kg)  BMI 26.79 kg/m2  SpO2 97%  Wt Readings from Last 3 Encounters:  06/14/15 157 lb 8 oz (71.442 kg)  04/20/15 160 lb (72.576 kg)  03/19/15 156 lb (70.761 kg)    Physical Exam  Constitutional: She is oriented to person, place, and time. She appears well-developed and well-nourished. No distress.  HENT:  Head: Normocephalic and atraumatic.  Right Ear: Hearing, tympanic membrane, external ear and ear canal normal.  Left Ear: Hearing, tympanic membrane, external ear and ear canal normal.  Nose: Nose normal.  Mouth/Throat: Uvula is midline, oropharynx is clear and moist and mucous membranes are normal. No oropharyngeal exudate, posterior oropharyngeal edema or posterior oropharyngeal erythema.  Eyes: Conjunctivae and EOM are normal. Pupils  are equal, round, and reactive to light. No scleral icterus.  Neck: Normal range of motion. Neck supple. Carotid bruit is not present. No thyromegaly present.  Cardiovascular: Normal rate, regular rhythm, normal heart sounds and intact distal pulses.   No murmur heard. Pulses:      Radial pulses are 2+ on the right side, and 2+ on the left side.  Pulmonary/Chest: Effort normal and breath sounds normal. No respiratory distress. She has no wheezes. She has no rales.  Abdominal: Soft. Bowel sounds are normal. She exhibits no distension and no mass. There is no tenderness. There is no rebound and no guarding.  Musculoskeletal: Normal range of motion. She exhibits no edema.  Lymphadenopathy:    She has no cervical adenopathy.  Neurological: She is alert and oriented to person, place, and time.  CN grossly intact, station and gait intact Recall 3/3  Calculation 4/5 serial 7s  Skin: Skin is warm and dry. No rash noted.  Psychiatric: She has a normal mood and affect. Her behavior is normal. Judgment and thought content normal.  Nursing note and vitals reviewed.  Results for orders placed or performed in visit on 06/14/15  HM MAMMOGRAPHY  Result Value Ref Range   HM Mammogram by GYN, normal   HM MAMMOGRAPHY  Result Value Ref Range   HM Mammogram by GYN, normal   HM PAP SMEAR  Result Value Ref Range   HM Pap smear DEXA spine -1.8, hip -0.7   HM DIABETES EYE EXAM  Result Value Ref Range   HM Diabetic Eye Exam No Retinopathy No Retinopathy      Assessment & Plan:  Hep C screening next visit.  Problem List Items Addressed This Visit    Vitamin D deficiency    Stable on 1000 IU daily.      Osteopenia    Discussed recommended calcium/vit D use. Given famhx, will focus on dietary calcium intake.      Mixed hyperlipidemia    Chronic, stable. Continue current regimen of zocor and zetia.      Medicare annual wellness visit, subsequent - Primary    I have personally reviewed the  Medicare Annual Wellness questionnaire and have noted 1. The patient's medical and social history 2. Their use of alcohol, tobacco or illicit drugs 3. Their current medications and supplements 4. The patient's functional ability including ADL's, fall risks, home safety risks and hearing or visual impairment. Cognitive function has been assessed and addressed as indicated.  5. Diet and physical activity 6. Evidence for depression or mood disorders The patients weight, height, BMI have been recorded in the chart. I have made referrals, counseling and provided education to the patient based on review of the above and I have provided the pt with a written personalized care plan for preventive services. Provider list updated.. See scanned questionairre as needed for further documentation. Reviewed preventative protocols and updated unless pt  declined.       GERD    Stable on zantac.      Diabetes type 2, controlled (HCC)    Chronic, stable. Continue metformin. Foot exam today.      Caregiver burden    Discussed stressors. Not overwhelming, has taken herself off sertraline.      Advanced care planning/counseling discussion    Advanced directives: has at home. Asked her to bring [place] a copy. Doesn't want prolonged life support if terminally ill. Son [name] is HCPOA.        Other Visit Diagnoses    Need for prophylactic vaccination and inoculation against influenza        Relevant Orders    Flu Vaccine QUAD 36+ mos PF IM (Fluarix & Fluzone Quad PF)        Follow up plan: Return in about 1 year (around 06/13/2016), or as needed, for medicare wellness visit.

## 2015-06-14 NOTE — Assessment & Plan Note (Signed)
Discussed recommended calcium/vit D use. Given famhx, will focus on dietary calcium intake.

## 2015-06-14 NOTE — Assessment & Plan Note (Signed)
Chronic, stable. Continue metformin. Foot exam today.

## 2015-06-14 NOTE — Assessment & Plan Note (Signed)
Discussed stressors. Not overwhelming, has taken herself off sertraline.

## 2015-06-14 NOTE — Assessment & Plan Note (Signed)
Stable on zantac.  

## 2015-06-14 NOTE — Assessment & Plan Note (Signed)

## 2015-06-14 NOTE — Patient Instructions (Addendum)
Flu shot today and prevnar today.  Ok to stay off sertraline.  Return as needed or in 1year for next medicare wellness visit.  Nice to see you today, call us with questions.   Health Maintenance, Female Adopting a healthy lifestyle and getting preventive care can go a long way to promote health and wellness. Talk with your health care provider about what schedule of regular examinations is right for you. This is a good chance for you to check in with your provider about disease prevention and staying healthy. In between checkups, there are plenty of things you can do on your own. Experts have done a lot of research about which lifestyle changes and preventive measures are most likely to keep you healthy. Ask your health care provider for more information. WEIGHT AND DIET  Eat a healthy diet  Be sure to include plenty of vegetables, fruits, low-fat dairy products, and lean protein.  Do not eat a lot of foods high in solid fats, added sugars, or salt.  Get regular exercise. This is one of the most important things you can do for your health.  Most adults should exercise for at least 150 minutes each week. The exercise should increase your heart rate and make you sweat (moderate-intensity exercise).  Most adults should also do strengthening exercises at least twice a week. This is in addition to the moderate-intensity exercise.  Maintain a healthy weight  Body mass index (BMI) is a measurement that can be used to identify possible weight problems. It estimates body fat based on height and weight. Your health care provider can help determine your BMI and help you achieve or maintain a healthy weight.  For females 2 years of age and older:   A BMI below 18.5 is considered underweight.  A BMI of 18.5 to 24.9 is normal.  A BMI of 25 to 29.9 is considered overweight.  A BMI of 30 and above is considered obese.  Watch levels of cholesterol and blood lipids  You should start having your  blood tested for lipids and cholesterol at 69 years of age, then have this test every 5 years.  You may need to have your cholesterol levels checked more often if:  Your lipid or cholesterol levels are high.  You are older than 69 years of age.  You are at high risk for heart disease.  CANCER SCREENING   Lung Cancer  Lung cancer screening is recommended for adults 56-72 years old who are at high risk for lung cancer because of a history of smoking.  A yearly low-dose CT scan of the lungs is recommended for people who:  Currently smoke.  Have quit within the past 15 years.  Have at least a 30-pack-year history of smoking. A pack year is smoking an average of one pack of cigarettes a day for 1 year.  Yearly screening should continue until it has been 15 years since you quit.  Yearly screening should stop if you develop a health problem that would prevent you from having lung cancer treatment.  Breast Cancer  Practice breast self-awareness. This means understanding how your breasts normally appear and feel.  It also means doing regular breast self-exams. Let your health care provider know about any changes, no matter how small.  If you are in your 20s or 30s, you should have a clinical breast exam (CBE) by a health care provider every 1-3 years as part of a regular health exam.  If you are 40 or older,  have a CBE every year. Also consider having a breast X-ray (mammogram) every year.  If you have a family history of breast cancer, talk to your health care provider about genetic screening.  If you are at high risk for breast cancer, talk to your health care provider about having an MRI and a mammogram every year.  Breast cancer gene (BRCA) assessment is recommended for women who have family members with BRCA-related cancers. BRCA-related cancers include:  Breast.  Ovarian.  Tubal.  Peritoneal cancers.  Results of the assessment will determine the need for genetic  counseling and BRCA1 and BRCA2 testing. Cervical Cancer Your health care provider may recommend that you be screened regularly for cancer of the pelvic organs (ovaries, uterus, and vagina). This screening involves a pelvic examination, including checking for microscopic changes to the surface of your cervix (Pap test). You may be encouraged to have this screening done every 3 years, beginning at age 21.  For women ages 30-65, health care providers may recommend pelvic exams and Pap testing every 3 years, or they may recommend the Pap and pelvic exam, combined with testing for human papilloma virus (HPV), every 5 years. Some types of HPV increase your risk of cervical cancer. Testing for HPV may also be done on women of any age with unclear Pap test results.  Other health care providers may not recommend any screening for nonpregnant women who are considered low risk for pelvic cancer and who do not have symptoms. Ask your health care provider if a screening pelvic exam is right for you.  If you have had past treatment for cervical cancer or a condition that could lead to cancer, you need Pap tests and screening for cancer for at least 20 years after your treatment. If Pap tests have been discontinued, your risk factors (such as having a new sexual partner) need to be reassessed to determine if screening should resume. Some women have medical problems that increase the chance of getting cervical cancer. In these cases, your health care provider may recommend more frequent screening and Pap tests. Colorectal Cancer  This type of cancer can be detected and often prevented.  Routine colorectal cancer screening usually begins at 69 years of age and continues through 69 years of age.  Your health care provider may recommend screening at an earlier age if you have risk factors for colon cancer.  Your health care provider may also recommend using home test kits to check for hidden blood in the stool.  A  small camera at the end of a tube can be used to examine your colon directly (sigmoidoscopy or colonoscopy). This is done to check for the earliest forms of colorectal cancer.  Routine screening usually begins at age 50.  Direct examination of the colon should be repeated every 5-10 years through 69 years of age. However, you may need to be screened more often if early forms of precancerous polyps or small growths are found. Skin Cancer  Check your skin from head to toe regularly.  Tell your health care provider about any new moles or changes in moles, especially if there is a change in a mole's shape or color.  Also tell your health care provider if you have a mole that is larger than the size of a pencil eraser.  Always use sunscreen. Apply sunscreen liberally and repeatedly throughout the day.  Protect yourself by wearing long sleeves, pants, a wide-brimmed hat, and sunglasses whenever you are outside. HEART DISEASE, DIABETES, AND   HIGH BLOOD PRESSURE   High blood pressure causes heart disease and increases the risk of stroke. High blood pressure is more likely to develop in:  People who have blood pressure in the high end of the normal range (130-139/85-89 mm Hg).  People who are overweight or obese.  People who are African American.  If you are 32-68 years of age, have your blood pressure checked every 3-5 years. If you are 41 years of age or older, have your blood pressure checked every year. You should have your blood pressure measured twice--once when you are at a hospital or clinic, and once when you are not at a hospital or clinic. Record the average of the two measurements. To check your blood pressure when you are not at a hospital or clinic, you can use:  An automated blood pressure machine at a pharmacy.  A home blood pressure monitor.  If you are between 56 years and 69 years old, ask your health care provider if you should take aspirin to prevent strokes.  Have  regular diabetes screenings. This involves taking a blood sample to check your fasting blood sugar level.  If you are at a normal weight and have a low risk for diabetes, have this test once every three years after 69 years of age.  If you are overweight and have a high risk for diabetes, consider being tested at a younger age or more often. PREVENTING INFECTION  Hepatitis B  If you have a higher risk for hepatitis B, you should be screened for this virus. You are considered at high risk for hepatitis B if:  You were born in a country where hepatitis B is common. Ask your health care provider which countries are considered high risk.  Your parents were born in a high-risk country, and you have not been immunized against hepatitis B (hepatitis B vaccine).  You have HIV or AIDS.  You use needles to inject street drugs.  You live with someone who has hepatitis B.  You have had sex with someone who has hepatitis B.  You get hemodialysis treatment.  You take certain medicines for conditions, including cancer, organ transplantation, and autoimmune conditions. Hepatitis C  Blood testing is recommended for:  Everyone born from [birth year] through 1965.  Anyone with known risk factors for hepatitis C. Sexually transmitted infections (STIs)  You should be screened for sexually transmitted infections (STIs) including gonorrhea and chlamydia if:  You are sexually active and are younger than 69 years of age.  You are older than 69 years of age and your health care provider tells you that you are at risk for this type of infection.  Your sexual activity has changed since you were last screened and you are at an increased risk for chlamydia or gonorrhea. Ask your health care provider if you are at risk.  If you do not have HIV, but are at risk, it may be recommended that you take a prescription medicine daily to prevent HIV infection. This is called pre-exposure prophylaxis (PrEP). You are  considered at risk if:  You are sexually active and do not regularly use condoms or know the HIV status of your partner(s).  You take drugs by injection.  You are sexually active with a partner who has HIV. Talk with your health care provider about whether you are at high risk of being infected with HIV. If you choose to begin PrEP, you should first be tested for HIV. You should then be tested  every 3 months for as long as you are taking PrEP.  PREGNANCY   If you are premenopausal and you may become pregnant, ask your health care provider about preconception counseling.  If you may become pregnant, take 400 to 800 micrograms (mcg) of folic acid every day.  If you want to prevent pregnancy, talk to your health care provider about birth control (contraception). OSTEOPOROSIS AND MENOPAUSE   Osteoporosis is a disease in which the bones lose minerals and strength with aging. This can result in serious bone fractures. Your risk for osteoporosis can be identified using a bone density scan.  If you are 45 years of age or older, or if you are at risk for osteoporosis and fractures, ask your health care provider if you should be screened.  Ask your health care provider whether you should take a calcium or vitamin D supplement to lower your risk for osteoporosis.  Menopause may have certain physical symptoms and risks.  Hormone replacement therapy may reduce some of these symptoms and risks. Talk to your health care provider about whether hormone replacement therapy is right for you.  HOME CARE INSTRUCTIONS   Schedule regular health, dental, and eye exams.  Stay current with your immunizations.   Do not use any tobacco products including cigarettes, chewing tobacco, or electronic cigarettes.  If you are pregnant, do not drink alcohol.  If you are breastfeeding, limit how much and how often you drink alcohol.  Limit alcohol intake to no more than 1 drink per day for nonpregnant women. One  drink equals 12 ounces of beer, 5 ounces of wine, or 1 ounces of hard liquor.  Do not use street drugs.  Do not share needles.  Ask your health care provider for help if you need support or information about quitting drugs.  Tell your health care provider if you often feel depressed.  Tell your health care provider if you have ever been abused or do not feel safe at home.   This information is not intended to replace advice given to you by your health care provider. Make sure you discuss any questions you have with your health care provider.   Document Released: 03/06/2011 Document Revised: 09/11/2014 Document Reviewed: 07/23/2013 Elsevier Interactive Patient Education [employer].

## 2015-06-14 NOTE — Progress Notes (Signed)
Pre visit review using our clinic review tool, if applicable. No additional management support is needed unless otherwise documented below in the visit note. 

## 2015-06-14 NOTE — Assessment & Plan Note (Signed)
Stable on 1000 IU daily.

## 2015-06-14 NOTE — Assessment & Plan Note (Signed)
Advanced directives: has at home. Asked her to bring [place] a copy. Doesn't want prolonged life support if terminally ill. Son [name] is HCPOA.

## 2015-06-14 NOTE — Assessment & Plan Note (Signed)
Chronic, stable. Continue current regimen of zocor and zetia.

## 2015-06-16 ENCOUNTER — Encounter: Payer: Medicare Other | Admitting: Family Medicine

## 2015-06-28 ENCOUNTER — Ambulatory Visit (INDEPENDENT_AMBULATORY_CARE_PROVIDER_SITE_OTHER): Payer: Medicare Other | Admitting: Primary Care

## 2015-06-28 ENCOUNTER — Encounter: Payer: Self-pay | Admitting: Primary Care

## 2015-06-28 VITALS — BP 132/68 | HR 78 | Temp 98.7°F | Ht 64.0 in | Wt 159.0 lb

## 2015-06-28 DIAGNOSIS — R05 Cough: Secondary | ICD-10-CM | POA: Diagnosis not present

## 2015-06-28 DIAGNOSIS — R059 Cough, unspecified: Secondary | ICD-10-CM

## 2015-06-28 MED ORDER — HYDROCODONE-HOMATROPINE 5-1.5 MG/5ML PO SYRP: 5.0000 mL | ORAL_SOLUTION | Freq: Every evening | ORAL | Status: DC | PRN

## 2015-06-28 NOTE — Progress Notes (Signed)
Pre visit review using our clinic review tool, if applicable. No additional management support is needed unless otherwise documented below in the visit note. 

## 2015-06-28 NOTE — Patient Instructions (Signed)
Start Omeprazole 20 mg tablets. This may be purchased over the counter. Take this everyday for 2 weeks.  You may take the Hycodan cough syrup as needed at bedtime.  Please schedule a follow up appointment in 2 weeks for spirometry testing to rule out asthma.  It was a pleasure meeting you!

## 2015-06-28 NOTE — Progress Notes (Signed)
Subjective:    Patient ID: Sarah Velasquez, female    DOB: 11/28/1945, 69 y.o.   MRN: [id number]  HPI  Ms. Nordhoff is a 69 year old female who presents today with a chief complaint of cough. Her cough has been present intermittently for the past year with the most recent round being present for the past 2 weeks. She is currently managed on Ranitidine 150 mg twice daily for acid reflux and Zyrtec 10 mg daily for allergies. Denies fevers, chills, sore throat, fatigue, feeling ill, wheezing, esophageal burning. She's been treated 5 times so far this year with various rounds of antibiotics. The antibiotics will improve symptoms short term.  Review of Systems  Constitutional: Positive for fatigue. Negative for fever and chills.  HENT: Positive for congestion, sneezing and sore throat. Negative for ear pain.   Respiratory: Positive for cough and shortness of breath.   Musculoskeletal: Negative for myalgias.       Past Medical History  Diagnosis Date  . Diabetes mellitus     typeII  . Hyperlipidemia 05/1995  . Diverticulosis of colon 09/2003    colonoscopy ext.and internal hemorrhoids (Dr. Vira Agar )12/03/2006// colonoscopy polyps multiple  benign; interal hemorrhoids ,divertics 09/16/2003  . GERD (gastroesophageal reflux disease)     EGD hiatal hernia; esophagitis/sigmoidoscopy WNL (Dr. Vira Agar) 03/04/1996: ABD. U/S  WNL (06/1996)//EGD multiple polyps ;small H.H (09/16/2003)  . Chest pain 02/15-16/2009    Hospital [facility]  chest pain rule out //stress Myoview negative 10/21/2007//Stress cardiolite ;apical thinning ,negative ischemia (09/14/2003)  . Osteopenia 12/2011    spine -1.8, hip -0.7  . Right lumbar radiculitis 2013    DDD, spondylosis, L3 radiculitis (MRI 07/2012) - ESI 09/2011 (Dr Sharlet Salina)   . H/O hiatal hernia   . PONV (postoperative nausea and vomiting)   . Vertigo     hx of  . Environmental allergies   . Drug-induced constipation   . Anxiety     Social History   Social History  .  Marital Status: Married    Spouse Name: N/A  . Number of Children: N/A  . Years of Education: N/A   Occupational History  . Not on file.   Social History Main Topics  . Smoking status: Never Smoker   . Smokeless tobacco: Never Used  . Alcohol Use: 0.0 oz/week    0 Standard drinks or equivalent per week     Comment: rarely - wine  . Drug Use: No  . Sexual Activity: No   Other Topics Concern  . Not on file   Social History Narrative   Lives with husband - takes care of him (h/o FTD).   Occ: Works part time at daycare   Activity: no regular exercise   Diet: good water, fruits/vegetables daily    Past Surgical History  Procedure Laterality Date  . Abdominal hysterectomy  1986    ovaries intact Uterine prolapse  . Nasal sinus surgery  1978  . Vaginal delivery      NSVD x 2  . Tubal ligation    . Dexa  12/2011    spine -1.8, hip -0.7  . Esi  09/2011    ([facility], [place])  . Colonoscopy w/ polypectomy    . Hand surgery  01/23/2002     Three fingers amputated.  Lawnmower   accident.  . Lumbar laminectomy/decompression microdiscectomy Right 11/06/2012    LUMBAR LAMINECTOMY/DECOMPRESSION MICRODISCECTOMY 1 LEVEL;  Surgeon: Eustace Moore, MD;  Right lumbar three-four extraforaminal diskectomy  . Colonoscopy  02/2014    diverticulosis, int hem, rpt 5 yrs Vira Agar)  . Trigger finger release Left 04/20/2015    Procedure: LEFT THUMB TRIGGER FINGER RELEASE;  Surgeon: Milly Jakob, MD;  Location: [place];  Service: Orthopedics;  Laterality: Left;    Family History  Problem Relation Age of Onset  . Heart disease Mother 8    MI  . COPD Father     emphysema  . Cancer Father     esophageal and brain cancer  . Heart disease Father 38    CABG  . Alcohol abuse Sister 84    etoh  . Heart disease Brother 49    MI    Allergies  Allergen Reactions  . Shellfish Allergy Anaphylaxis  . Azithromycin     REACTION: rash  . Meperidine Hcl     REACTION: nausea  .  Neurontin [Gabapentin] Other (See Comments)    Incoherent  . Omeprazole     REACTION: rash ([facility] brand)    Current Outpatient Prescriptions on File Prior to Visit  Medication Sig Dispense Refill  . aspirin 81 MG tablet Take 81 mg by mouth daily.    . cetirizine (ZYRTEC ALLERGY) 10 MG tablet Take 10 mg by mouth daily.      . Cholecalciferol (VITAMIN D3) 1000 UNITS CAPS Take 1 capsule by mouth daily.    Marland Kitchen docusate sodium (COLACE) 100 MG capsule Take 200 mg by mouth daily.     Marland Kitchen ezetimibe (ZETIA) 10 MG tablet Take 1 tablet (10 mg total) by mouth daily. 30 tablet 3  . glucose blood (ONE TOUCH ULTRA TEST) test strip 1 each by Other route daily. Use to check sugar daily Dx: E11.9 **ONE TOUCH ULTRA BLUE** 100 each 3  . metFORMIN (GLUCOPHAGE) 1000 MG tablet TAKE 1 TABLET BY MOUTH 2 TIMES DAILY WITH A MEAL 180 tablet 1  . Multiple Vitamin (MULTIVITAMIN) tablet Take 1 tablet by mouth daily.    Glory Rosebush DELICA LANCETS 58N MISC USE ONE LANCET ONCE DAILY AS DIRECTED 100 each 0  . ranitidine (ZANTAC) 150 MG tablet Take 150 mg by mouth 2 (two) times daily.     . simvastatin (ZOCOR) 80 MG tablet Take 1 tablet (80 mg total) by mouth daily. 30 tablet 3  . traMADol (ULTRAM) 50 MG tablet Take 1 tablet (50 mg total) by mouth every 6 (six) hours as needed. 30 tablet 0   No current facility-administered medications on file prior to visit.    BP 132/68 mmHg  Pulse 78  Temp(Src) 98.7 F (37.1 C) (Oral)  Ht 5\' 4"  (1.626 m)  Wt 159 lb (72.122 kg)  BMI 27.28 kg/m2  SpO2 98%    Objective:   Physical Exam  Constitutional: She appears well-nourished.  HENT:  Right Ear: Tympanic membrane and ear canal normal.  Left Ear: Tympanic membrane and ear canal normal.  Nose: Nose normal. Right sinus exhibits no maxillary sinus tenderness and no frontal sinus tenderness. Left sinus exhibits no maxillary sinus tenderness and no frontal sinus tenderness.  Mouth/Throat: Oropharynx is clear and moist.  Eyes:  Conjunctivae are normal. Pupils are equal, round, and reactive to light.  Neck: Neck supple.  Cardiovascular: Normal rate and regular rhythm.   Pulmonary/Chest: Effort normal and breath sounds normal. She has no wheezes. She has no rales.  Lymphadenopathy:    She has no cervical adenopathy.  Skin: Skin is warm and dry.          Assessment & Plan:  Chronic cough:  Suspect this is either allergy/asthma related or GERD. Currently managed on ranitidine 150 BID, will switch to brand Prilosec as she's been able to tolerate this well in the past. She will take this daily for 2 weeks and follow up. We will complete spirometry in 2 weeks to test for asthma if no improvement on Prilosec. Consider changing zyrtec to singulair.  She does not appear acutely ill or infectious. Consulted with PCP regarding case.

## 2015-07-10 ENCOUNTER — Other Ambulatory Visit: Payer: Self-pay | Admitting: Family Medicine

## 2015-07-12 ENCOUNTER — Ambulatory Visit (INDEPENDENT_AMBULATORY_CARE_PROVIDER_SITE_OTHER): Payer: Medicare Other | Admitting: Primary Care

## 2015-07-12 ENCOUNTER — Encounter: Payer: Self-pay | Admitting: Primary Care

## 2015-07-12 ENCOUNTER — Ambulatory Visit (INDEPENDENT_AMBULATORY_CARE_PROVIDER_SITE_OTHER)
Admission: RE | Admit: 2015-07-12 | Discharge: 2015-07-12 | Disposition: A | Discharge status: Home / Self Care | Payer: Medicare Other | Source: Ambulatory Visit | Attending: Primary Care | Admitting: Primary Care

## 2015-07-12 VITALS — BP 136/76 | HR 81 | Temp 97.5°F | Ht 64.0 in | Wt 157.4 lb

## 2015-07-12 DIAGNOSIS — R05 Cough: Secondary | ICD-10-CM | POA: Diagnosis not present

## 2015-07-12 DIAGNOSIS — R059 Cough, unspecified: Secondary | ICD-10-CM

## 2015-07-12 MED ORDER — TRAMADOL HCL 50 MG PO TABS
50.0000 mg | ORAL_TABLET | Freq: Two times a day (BID) | ORAL | Status: DC | PRN
Start: 2015-07-12 — End: 2020-05-14

## 2015-07-12 MED ORDER — PREDNISONE 10 MG PO TABS: ORAL_TABLET | ORAL | Status: DC

## 2015-07-12 NOTE — Patient Instructions (Addendum)
You do not have asthma.  Continue Zyrtec.   Start Prednisone tablets. Take 3 tablets by mouth for 3 days, then 2 tablets for 3 days, then 1 tablet for 3 days.  Stop by the front and speak with Ebony Hail regarding your referral to Pulmonology.   Complete xray(s) prior to leaving today. I will contact you regarding your results.  You may take Tramadol twice daily as needed for cough and chronic pain.  It was a pleasure to see you today!

## 2015-07-12 NOTE — Progress Notes (Signed)
Pre visit review using our clinic review tool, if applicable. No additional management support is needed unless otherwise documented below in the visit note. 

## 2015-07-12 NOTE — Progress Notes (Signed)
Subjective:    Patient ID: [name], female    DOB: Feb 11, 1946, 69 y.o.   MRN: [id number]  HPI  Ms. Butzer is a 69 year old female who presents today for follow up of cough. She was evaluated 2 weeks ago for chronic cough that had been present for nearly 1 year. She was switched from Ranitidine to Prilosec. She was encouraged to continue Zyrtec. She did not appear infectious last visit.   Since her last visit she's not noticed any difference since switching to Prilosec. She denies congestion, fevers, fatigue. She continues to cough constantly.   Review of Systems  Constitutional: Negative for fever and chills.  HENT: Negative for congestion and rhinorrhea.   Respiratory: Positive for cough and wheezing. Negative for shortness of breath.   Cardiovascular: Negative for chest pain.  Musculoskeletal: Negative for myalgias.       Past Medical History  Diagnosis Date  . Diabetes mellitus     typeII  . Hyperlipidemia 05/1995  . Diverticulosis of colon 09/2003    colonoscopy ext.and internal hemorrhoids (Dr. Vira Agar )12/03/2006// colonoscopy polyps multiple  benign; interal hemorrhoids ,divertics 09/16/2003  . GERD (gastroesophageal reflux disease)     EGD hiatal hernia; esophagitis/sigmoidoscopy WNL (Dr. Vira Agar) 03/04/1996: ABD. U/S  WNL (06/1996)//EGD multiple polyps ;small H.H (09/16/2003)  . Chest pain 02/15-16/2009    Hospital [facility]  chest pain rule out //stress Myoview negative 10/21/2007//Stress cardiolite ;apical thinning ,negative ischemia (09/14/2003)  . Osteopenia 12/2011    spine -1.8, hip -0.7  . Right lumbar radiculitis 2013    DDD, spondylosis, L3 radiculitis (MRI 07/2012) - ESI 09/2011 (Dr Sharlet Salina)   . H/O hiatal hernia   . PONV (postoperative nausea and vomiting)   . Vertigo     hx of  . Environmental allergies   . Drug-induced constipation   . Anxiety     Social History   Social History  . Marital Status: Married    Spouse Name: N/A  . Number of Children: N/A    . Years of Education: N/A   Occupational History  . Not on file.   Social History Main Topics  . Smoking status: Never Smoker   . Smokeless tobacco: Never Used  . Alcohol Use: 0.0 oz/week    0 Standard drinks or equivalent per week     Comment: rarely - wine  . Drug Use: No  . Sexual Activity: No   Other Topics Concern  . Not on file   Social History Narrative   Lives with husband - takes care of him (h/o FTD).   Occ: Works part time at daycare   Activity: no regular exercise   Diet: good water, fruits/vegetables daily    Past Surgical History  Procedure Laterality Date  . Abdominal hysterectomy  1986    ovaries intact Uterine prolapse  . Nasal sinus surgery  1978  . Vaginal delivery      NSVD x 2  . Tubal ligation    . Dexa  12/2011    spine -1.8, hip -0.7  . Esi  09/2011    (Chasnis, [place])  . Colonoscopy w/ polypectomy    . Hand surgery  01/23/2002     Three fingers amputated.  Lawnmower   accident.  . Lumbar laminectomy/decompression microdiscectomy Right 11/06/2012    LUMBAR LAMINECTOMY/DECOMPRESSION MICRODISCECTOMY 1 LEVEL;  Surgeon: Eustace Moore, MD;  Right lumbar three-four extraforaminal diskectomy  . Colonoscopy  02/2014    diverticulosis, int hem, rpt 5 yrs Vira Agar)  .  Trigger finger release Left 04/20/2015    Procedure: LEFT THUMB TRIGGER FINGER RELEASE;  Surgeon: Milly Jakob, MD;  Location: [place];  Service: Orthopedics;  Laterality: Left;    Family History  Problem Relation Age of Onset  . Heart disease Mother 34    MI  . COPD Father     emphysema  . Cancer Father     esophageal and brain cancer  . Heart disease Father 98    CABG  . Alcohol abuse Sister 60    etoh  . Heart disease Brother 78    MI    Allergies  Allergen Reactions  . Shellfish Allergy Anaphylaxis  . Azithromycin     REACTION: rash  . Meperidine Hcl     REACTION: nausea  . Neurontin [Gabapentin] Other (See Comments)    Incoherent  . Omeprazole      REACTION: rash ([facility] brand)    Current Outpatient Prescriptions on File Prior to Visit  Medication Sig Dispense Refill  . aspirin 81 MG tablet Take 81 mg by mouth daily.    . Cholecalciferol (VITAMIN D3) 1000 UNITS CAPS Take 1 capsule by mouth daily.    Marland Kitchen docusate sodium (COLACE) 100 MG capsule Take 200 mg by mouth daily.     Marland Kitchen glucose blood (ONE TOUCH ULTRA TEST) test strip 1 each by Other route daily. Use to check sugar daily Dx: E11.9 **ONE TOUCH ULTRA BLUE** 100 each 3  . metFORMIN (GLUCOPHAGE) 1000 MG tablet TAKE 1 TABLET BY MOUTH 2 TIMES DAILY WITH A MEAL 180 tablet 1  . Multiple Vitamin (MULTIVITAMIN) tablet Take 1 tablet by mouth daily.    Glory Rosebush DELICA LANCETS 50P MISC USE ONE LANCET ONCE DAILY AS DIRECTED 100 each 0  . ranitidine (ZANTAC) 150 MG tablet Take 150 mg by mouth 2 (two) times daily.     . simvastatin (ZOCOR) 80 MG tablet TAKE ONE TABLET BY MOUTH EVERY DAY 30 tablet 11  . ZETIA 10 MG tablet TAKE ONE TABLET BY MOUTH EVERY DAY 30 tablet 11  . cetirizine (ZYRTEC ALLERGY) 10 MG tablet Take 10 mg by mouth daily.       No current facility-administered medications on file prior to visit.    BP 136/76 mmHg  Pulse 81  Temp(Src) 97.5 F (36.4 C) (Oral)  Ht 5\' 4"  (1.626 m)  Wt 157 lb 6.4 oz (71.396 kg)  BMI 27.00 kg/m2  SpO2 97%    Objective:   Physical Exam  Constitutional: She appears well-nourished.  HENT:  Right Ear: External ear normal.  Left Ear: External ear normal.  Mouth/Throat: Oropharynx is clear and moist.  Eyes: Conjunctivae are normal. Pupils are equal, round, and reactive to light.  Neck: Neck supple.  Cardiovascular: Normal rate and regular rhythm.   Pulmonary/Chest: Effort normal. She has wheezes.  Skin: Skin is warm and dry.          Assessment & Plan:  Chronic cough:  Present for 1 year. No improvement on prilosec. Takes daily Zytrec. Spirometry negative in office today. She does not have an infectious process. Will send to  pulmonology for further evaluation. RX for prednisone taper today. Refilled tramadol PRN for cough suppressant. PCP aware of plan of care.

## 2015-07-26 ENCOUNTER — Ambulatory Visit (INDEPENDENT_AMBULATORY_CARE_PROVIDER_SITE_OTHER): Payer: Medicare Other | Admitting: Internal Medicine

## 2015-07-26 ENCOUNTER — Encounter: Payer: Self-pay | Admitting: Internal Medicine

## 2015-07-26 VITALS — BP 128/64 | HR 85 | Wt 159.0 lb

## 2015-07-26 DIAGNOSIS — J42 Unspecified chronic bronchitis: Secondary | ICD-10-CM | POA: Insufficient documentation

## 2015-07-26 MED ORDER — ALBUTEROL SULFATE HFA 108 (90 BASE) MCG/ACT IN AERS: 2.0000 | INHALATION_SPRAY | RESPIRATORY_TRACT | Status: DC | PRN

## 2015-07-26 MED ORDER — CETIRIZINE HCL 10 MG PO TABS: 10.0000 mg | ORAL_TABLET | Freq: Every day | ORAL | Status: DC

## 2015-07-26 MED ORDER — FLUTICASONE FUROATE-VILANTEROL 100-25 MCG/INH IN AEPB: 1.0000 | INHALATION_SPRAY | Freq: Every day | RESPIRATORY_TRACT | Status: AC

## 2015-07-26 MED ORDER — FLUTICASONE FUROATE-VILANTEROL 100-25 MCG/INH IN AEPB: 1.0000 | INHALATION_SPRAY | Freq: Every day | RESPIRATORY_TRACT | Status: DC

## 2015-07-26 MED ORDER — UMECLIDINIUM BROMIDE 62.5 MCG/INH IN AEPB: 1.0000 | INHALATION_SPRAY | Freq: Every day | RESPIRATORY_TRACT | Status: DC

## 2015-07-26 MED ORDER — UMECLIDINIUM BROMIDE 62.5 MCG/INH IN AEPB
1.0000 | INHALATION_SPRAY | Freq: Every day | RESPIRATORY_TRACT | Status: AC
Start: 2015-07-26 — End: 2015-07-27

## 2015-07-26 NOTE — Patient Instructions (Signed)
Chronic Bronchitis Chronic bronchitis is a lasting inflammation of the bronchial tubes, which are the tubes that carry air into your lungs. This is inflammation that occurs:   On most days of the week.   For at least three months at a time.   Over a period of two years in a row. When the bronchial tubes are inflamed, they start to produce mucus. The inflammation and buildup of mucus make it more difficult to breathe. Chronic bronchitis is usually a permanent problem and is one type of chronic obstructive pulmonary disease (COPD). People with chronic bronchitis are at greater risk for getting repeated colds, or respiratory infections. CAUSES  Chronic bronchitis most often occurs in people who have:  Long-standing, severe asthma.  A history of smoking.  Asthma and who also smoke. SIGNS AND SYMPTOMS  Chronic bronchitis may cause the following:   A cough that brings up mucus (productive cough).  Shortness of breath.  Early morning headache.  Wheezing.  Chest discomfort.   Recurring respiratory infections. DIAGNOSIS  Your health care provider may confirm the diagnosis by:  Taking your medical history.  Performing a physical exam.  Taking a chest X-ray.   Performing pulmonary function tests. TREATMENT  Treatment involves controlling symptoms with medicines, oxygen therapy, or making lifestyle changes, such as exercising and eating a healthy, well-balanced diet. Medicines could include:  Inhalers to improve air flow in and out of your lungs.  Antibiotics to treat bacterial infections, such as pneumonia, sinus infections, and acute bronchitis. As a preventative measure, your health care provider may recommend routine vaccinations for influenza and pneumonia. This is to prevent infection and hospitalization since you may be more at risk for these types of infections.  HOME CARE INSTRUCTIONS  Take medicines only as directed by your health care provider.   If you smoke  cigarettes, chew tobacco, or use electronic cigarettes, quit. If you need help quitting, ask your health care provider.  Avoid pollen, dust, animal dander, molds, smoke, and other things that cause shortness of breath or wheezing attacks.  Talk to your health care provider about possible exercise routines. Regular exercise is very important to help you feel better.  If you are prescribed oxygen use at home follow these guidelines:  Never smoke while using oxygen. Oxygen does not burn or explode, but flammable materials will burn faster in the presence of oxygen.  Keep a fire extinguisher close by. Let your fire department know that you have oxygen in your home.  Warn visitors not to smoke near you when you are using oxygen. Put up "no smoking" signs in your home where you most often use the oxygen.  Regularly test your smoke detectors at home to make sure they work. If you receive care in your home from a nurse or other health care provider, he or she may also check to make sure your smoke detectors work.  Ask your health care provider whether you would benefit from a pulmonary rehabilitation program.  Do not wait to get medical care if you have any concerning symptoms. Delays could cause permanent injury and may be life threatening. SEEK MEDICAL CARE IF:  You have increased coughing or shortness of breath or both.  You have muscle aches.  You have chest pain.  Your mucus gets thicker.  Your mucus changes from clear or white to yellow, green, gray, or bloody. SEEK IMMEDIATE MEDICAL CARE IF:  Your usual medicines do not stop your wheezing.   You have increased difficulty breathing.     You have any problems with the medicine you are taking, such as a rash, itching, swelling, or trouble breathing. MAKE SURE YOU:   Understand these instructions.  Will watch your condition.  Will get help right away if you are not doing well or get worse.   This information is not intended to  replace advice given to you by your health care provider. Make sure you discuss any questions you have with your health care provider.   Document Released: 06/08/2006 Document Revised: 09/11/2014 Document Reviewed: 09/29/2013 [employer] Interactive Patient Education 2016 [employer]  

## 2015-07-26 NOTE — Progress Notes (Signed)
[facility] Consultation      Date: 07/26/2015,   MRN# [id number] Sarah Velasquez [birth year] Code Status:  Hosp day:@LENGTHOFSTAYDAYS @ Referring MD: @ATDPROV @     PCP:      AdmissionWeight: 159 lb (72.122 kg)                 CurrentWeight: 159 lb (72.122 kg) Sarah Velasquez is a 69 y.o. old female seen in consultation for cough.  REFERRAL CONSULTING PHYSICIAN/NP Alma Friendly NP   CHIEF COMPLAINT:   Cough wheezing, chest congestion   HISTORY OF PRESENT ILLNESS  69 yo white female seen today for chronic cough approx for 1 year Patient has been on oral abx 7 times in the past year for her cough Her cough is associated with intermittent production of phlegm, also associated with wheezing and some SOB Patient does NOT have Lower ext swelling, has been on steroids several weeks ago and had no affect on cough Patient has reflux and on PPI Patient denies fevers, chills. 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  She is exposed to household sprays    PAST MEDICAL HISTORY   Past Medical History  Diagnosis Date  . Diabetes mellitus     typeII  . Hyperlipidemia 05/1995  . Diverticulosis of colon 09/2003    colonoscopy ext.and internal hemorrhoids (Dr. Vira Agar )12/03/2006// colonoscopy polyps multiple  benign; interal hemorrhoids ,divertics 09/16/2003  . GERD (gastroesophageal reflux disease)     EGD hiatal hernia; esophagitis/sigmoidoscopy WNL (Dr. Vira Agar) 03/04/1996: ABD. U/S  WNL (06/1996)//EGD multiple polyps ;small H.H (09/16/2003)  . Chest pain 02/15-16/2009    Hospital [facility]  chest pain rule out //stress Myoview negative 10/21/2007//Stress cardiolite ;apical thinning ,negative ischemia (09/14/2003)  . Osteopenia 12/2011    spine -1.8, hip -0.7  . Right lumbar radiculitis 2013    DDD, spondylosis, L3 radiculitis (MRI 07/2012) - ESI 09/2011 (Dr  Sharlet Salina)   . H/O hiatal hernia   . PONV (postoperative nausea and vomiting)   . Vertigo     hx of  . Environmental allergies   . Drug-induced constipation   . Anxiety      SURGICAL HISTORY   Past Surgical History  Procedure Laterality Date  . Abdominal hysterectomy  1986    ovaries intact Uterine prolapse  . Nasal sinus surgery  1978  . Vaginal delivery      NSVD x 2  . Tubal ligation    . Dexa  12/2011    spine -1.8, hip -0.7  . Esi  09/2011    ([place], [place])  . Colonoscopy w/ polypectomy    . Hand surgery  01/23/2002     Three fingers amputated.  Lawnmower   accident.  . Lumbar laminectomy/decompression microdiscectomy Right 11/06/2012    LUMBAR LAMINECTOMY/DECOMPRESSION MICRODISCECTOMY 1 LEVEL;  Surgeon: Eustace Moore, MD;  Right lumbar three-four extraforaminal diskectomy  . Colonoscopy  02/2014    diverticulosis, int hem, rpt 5 yrs Vira Agar)  . Trigger finger release Left 04/20/2015    Procedure: LEFT THUMB TRIGGER FINGER RELEASE;  Surgeon: Milly Jakob, MD;  Location: [facility];  Service: Orthopedics;  Laterality: Left;     FAMILY HISTORY   Family History  Problem Relation Age of Onset  . Heart disease Mother 58    MI  . COPD Father     emphysema  . Cancer Father  esophageal and brain cancer  . Heart disease Father 64    CABG  . Alcohol abuse Sister 71    etoh  . Heart disease Brother 57    MI     SOCIAL HISTORY   Social History  Substance Use Topics  . Smoking status: Never Smoker   . Smokeless tobacco: Never Used  . Alcohol Use: 0.0 oz/week    0 Standard drinks or equivalent per week     Comment: rarely - wine     MEDICATIONS    Home Medication:  Current Outpatient Rx  Name  Route  Sig  Dispense  Refill  . aspirin 81 MG tablet   Oral   Take 81 mg by mouth daily.         . cetirizine (ZYRTEC ALLERGY) 10 MG tablet   Oral   Take 10 mg by mouth daily.           . Cholecalciferol (VITAMIN D3) 1000 UNITS CAPS    Oral   Take 1 capsule by mouth daily.         Marland Kitchen docusate sodium (COLACE) 100 MG capsule   Oral   Take 200 mg by mouth daily.          Marland Kitchen glucose blood (ONE TOUCH ULTRA TEST) test strip   Other   1 each by Other route daily. Use to check sugar daily Dx: E11.9 **ONE TOUCH ULTRA BLUE**   100 each   3   . metFORMIN (GLUCOPHAGE) 1000 MG tablet      TAKE 1 TABLET BY MOUTH 2 TIMES DAILY WITH A MEAL   180 tablet   1   . Multiple Vitamin (MULTIVITAMIN) tablet   Oral   Take 1 tablet by mouth daily.         Marland Kitchen omeprazole (PRILOSEC OTC) 20 MG tablet   Oral   Take 20 mg by mouth daily.         Glory Rosebush DELICA LANCETS [id number] MISC      USE ONE LANCET ONCE DAILY AS DIRECTED   100 each   0   . predniSONE (DELTASONE) 10 MG tablet      Take 3 tablets daily for 3 days, then 2 tablets daily for 3 days, then 1 tablet daily for 3 days.   18 tablet   0   . ranitidine (ZANTAC) 150 MG tablet   Oral   Take 150 mg by mouth 2 (two) times daily.          . simvastatin (ZOCOR) 80 MG tablet      TAKE ONE TABLET BY MOUTH EVERY DAY   30 tablet   11     PLEASE SEND REFILLS FOR NEXT FILLTHANK YOU   . traMADol (ULTRAM) 50 MG tablet   Oral   Take 1 tablet (50 mg total) by mouth every 12 (twelve) hours as needed (cough).   60 tablet   0   . ZETIA 10 MG tablet      TAKE ONE TABLET BY MOUTH EVERY DAY   30 tablet   11     PLEASE SEND REFILLS FOR NEXT FILLTHANK YOU     Current Medication:  Current outpatient prescriptions:  .  aspirin 81 MG tablet, Take 81 mg by mouth daily., Disp: , Rfl:  .  cetirizine (ZYRTEC ALLERGY) 10 MG tablet, Take 10 mg by mouth daily.  , Disp: , Rfl:  .  Cholecalciferol (VITAMIN D3) 1000 UNITS CAPS, Take 1 capsule  by mouth daily., Disp: , Rfl:  .  docusate sodium (COLACE) 100 MG capsule, Take 200 mg by mouth daily. , Disp: , Rfl:  .  glucose blood (ONE TOUCH ULTRA TEST) test strip, 1 each by Other route daily. Use to check sugar daily Dx: E11.9 **ONE  TOUCH ULTRA BLUE**, Disp: 100 each, Rfl: 3 .  metFORMIN (GLUCOPHAGE) 1000 MG tablet, TAKE 1 TABLET BY MOUTH 2 TIMES DAILY WITH A MEAL, Disp: 180 tablet, Rfl: 1 .  Multiple Vitamin (MULTIVITAMIN) tablet, Take 1 tablet by mouth daily., Disp: , Rfl:  .  omeprazole (PRILOSEC OTC) 20 MG tablet, Take 20 mg by mouth daily., Disp: , Rfl:  .  ONETOUCH DELICA LANCETS [id number] MISC, USE ONE LANCET ONCE DAILY AS DIRECTED, Disp: 100 each, Rfl: 0 .  predniSONE (DELTASONE) 10 MG tablet, Take 3 tablets daily for 3 days, then 2 tablets daily for 3 days, then 1 tablet daily for 3 days., Disp: 18 tablet, Rfl: 0 .  ranitidine (ZANTAC) 150 MG tablet, Take 150 mg by mouth 2 (two) times daily. , Disp: , Rfl:  .  simvastatin (ZOCOR) 80 MG tablet, TAKE ONE TABLET BY MOUTH EVERY DAY, Disp: 30 tablet, Rfl: 11 .  traMADol (ULTRAM) 50 MG tablet, Take 1 tablet (50 mg total) by mouth every 12 (twelve) hours as needed (cough)., Disp: 60 tablet, Rfl: 0 .  ZETIA 10 MG tablet, TAKE ONE TABLET BY MOUTH EVERY DAY, Disp: 30 tablet, Rfl: 11    ALLERGIES   Shellfish allergy; Azithromycin; Meperidine hcl; Neurontin; and Omeprazole     REVIEW OF SYSTEMS   Review of Systems  Constitutional: Negative for fever, chills and weight loss.  HENT: Positive for congestion.   Eyes: Negative for blurred vision and double vision.  Respiratory: Positive for cough, sputum production, shortness of breath and wheezing. Negative for hemoptysis.   Cardiovascular: Negative for chest pain, palpitations, orthopnea and leg swelling.  Gastrointestinal: Negative for heartburn, nausea, vomiting and abdominal pain.  Genitourinary: Negative.   Musculoskeletal: Negative for myalgias.  Skin: Negative for rash.  Neurological: Negative for dizziness, tingling, tremors and headaches.  Endo/Heme/Allergies: Does not bruise/bleed easily.  Psychiatric/Behavioral: Negative.   All other systems reviewed and are negative.    VS: BP 128/64 mmHg  Pulse 85  Wt  159 lb (72.122 kg)  SpO2 97%     PHYSICAL EXAM  Physical Exam  Constitutional: She is oriented to person, place, and time. She appears well-developed and well-nourished. No distress.  HENT:  Head: Normocephalic and atraumatic.  Mouth/Throat: No oropharyngeal exudate.  Eyes: EOM are normal. Pupils are equal, round, and reactive to light. No scleral icterus.  Neck: Normal range of motion. Neck supple.  Cardiovascular: Normal rate, regular rhythm and normal heart sounds.   No murmur heard. Pulmonary/Chest: No stridor. No respiratory distress. She has no wheezes. She has no rales.  Abdominal: Soft. Bowel sounds are normal.  Musculoskeletal: Normal range of motion. She exhibits no edema.  RT arm s/p trauma 13 years ago Patient with amputations Only has thumb and first finger intact Scar of RT forearm  Neurological: She is alert and oriented to person, place, and time.  Skin: Skin is warm. She is not diaphoretic.  Psychiatric: She has a normal mood and affect.           IMAGING    Dg Chest 2 View  07/12/2015  CLINICAL DATA:  Chronic cough for 1 year. EXAM: CHEST  2 VIEW COMPARISON:  Comparison made to  prior study 01/13/2015. FINDINGS: The heart size and mediastinal contours are within normal limits. Both lungs are clear. Degenerative changes thoracic spine. IMPRESSION: No active cardiopulmonary disease.  Exam stable from prior exam. Electronically Signed   By: [place]   On: 07/12/2015 16:17   2 View CXR images reveiwed 07/26/2015 Evidence of flattened diaphragms     ASSESSMENT/PLAN  69 yo white female with signs and symptoms consistent with  chronic cough with COPD with chronic  Bronchitis with allergic rhinitis  1.start breo-(inhaled steroids/LABA) 2.start Incruse-(long acting AC) 3.albuterol as needed 4.start zyrtec 5.continue prilosec For GERD 6.will obtain PFT's and CT chest if symptoms persist  Fill follow up in 3 months  I have personally obtained a  history, examined the patient, evaluated laboratory and independently reviewed imaging results, formulated the assessment and plan and placed orders. Patient /Family are satisfied with Plan of action and management. All questions answered  Corrin Parker, M.D.  [facility]  Medical Director [place] Director [facility] Card[facility]-P[facility] Department

## 2015-07-27 ENCOUNTER — Telehealth: Payer: Self-pay | Admitting: *Deleted

## 2015-07-27 NOTE — Telephone Encounter (Signed)
Submitted PA for Incruse thru CMM. [id number]. Sent for review. Will await response.

## 2015-07-28 ENCOUNTER — Encounter: Payer: Self-pay | Admitting: *Deleted

## 2015-07-28 NOTE — Progress Notes (Signed)
Received msg from patient's pharmacy that Dr. Mortimer Fries has prescribed ellipta and needs prior authorization.  Explained that Dr. Mortimer Fries does not work in our office, they need to contact his office at [employer].

## 2015-07-28 NOTE — Telephone Encounter (Signed)
Checked status of PA is still in process. It can take up to 5 business days for denial/approval.

## 2015-08-02 NOTE — Telephone Encounter (Signed)
Incruse was denied by insurance. They say pt must try Serevent and Stiolto Resp. Please advise.

## 2015-08-03 ENCOUNTER — Telehealth: Payer: Self-pay | Admitting: *Deleted

## 2015-08-03 NOTE — Telephone Encounter (Signed)
*  STAT* If patient is at the pharmacy, call can be transferred to refill team.   1. Which medications need to be refilled? (please list name of each medication and dose if known) Umeclidinium Bromide (INCRUSE ELLIPTA) 62.5 MCG/INH AEPB   2. Which pharmacy/location (including street and city if local pharmacy) is medication to be sent to? [facility]   3. Do they need a 30 day or 90 day supply? 90 day  Insurance denied the Incruse and want to replace Serevent Diskus or Stiolto Respimat or needs a prior [place]. Please call patient.

## 2015-08-03 NOTE — Telephone Encounter (Signed)
LMOM for pt to return call. Re: Incruse

## 2015-08-04 NOTE — Telephone Encounter (Signed)
Per DS give pt samples of Incruse until [facility] gets back from vacation on Monday and then [place] can make final decision.  LMOM for pt to call back to inform her of this. Will await call.   Dr. Mortimer Fries, please advise on this matter. Insurance states alternatives are Serevent or [employer].

## 2015-08-04 NOTE — Telephone Encounter (Signed)
Pt has the free month of the Incruse. Informed I would call back when I am advised. Pt agreed.

## 2015-08-05 NOTE — Telephone Encounter (Signed)
Awaiting response for Dr. Mortimer Fries as he will return to office on 08/09/15.

## 2015-08-09 NOTE — Telephone Encounter (Signed)
Stick with [facility] then

## 2015-08-09 NOTE — Telephone Encounter (Signed)
Pt states the [facility] is helping and is covered by insurance. She states she has enough Breo to get her through January since she has already paid for the 1st prescription. Please advise if she can continue the [facility] or if you want her to stop it and start the 2 prescriptions you want sent to pharmacy. Thanks.

## 2015-08-09 NOTE — Telephone Encounter (Signed)
Pt informed. Nothing further needed. 

## 2015-08-09 NOTE — Telephone Encounter (Signed)
Will stop [employer] 220 mcg 2 puffs BID and  Stiolto 2 puffs once daily

## 2015-09-02 ENCOUNTER — Encounter: Payer: Self-pay | Admitting: *Deleted

## 2015-09-13 ENCOUNTER — Telehealth: Payer: Self-pay | Admitting: Family Medicine

## 2015-09-13 NOTE — Telephone Encounter (Signed)
Patient called and asked if she needed to be seen in 6 months.  Patient says she usually has a 6 month follow up to check her cholesterol and diabetes.  Patient said the cost of Zetia went up. She stopped taking the Zetia and she's just taking the Simvastatin.  Patient said Sarah Velasquez is going to generic this month, but her insurance won't cover it. Please advise.

## 2015-09-13 NOTE — Telephone Encounter (Signed)
Last medicare wellness visit 07/2015 so not yet due for refill. Would offer trial of stronger cholesterol med first prior to filling PA for zetia - to see if zetia needed.

## 2015-09-14 MED ORDER — ATORVASTATIN CALCIUM 80 MG PO TABS: 80.0000 mg | ORAL_TABLET | Freq: Every day | ORAL | Status: DC

## 2015-09-14 NOTE — Telephone Encounter (Signed)
Spoke with patient and she is willing to try stronger med. Will check with pharmacy later today.

## 2015-09-14 NOTE — Telephone Encounter (Signed)
Sent in

## 2015-09-14 NOTE — Telephone Encounter (Signed)
Message left advising patient that follow up not needed until May. Advised to call and let me know if she wanted stronger cholesterol med instead of PA for Zetia.

## 2015-09-14 NOTE — Addendum Note (Signed)
Addended by: Ria Bush on: 09/14/2015 06:38 PM   Modules accepted: Orders, Medications

## 2015-09-28 ENCOUNTER — Other Ambulatory Visit: Payer: Self-pay | Admitting: Family Medicine

## 2015-10-07 ENCOUNTER — Telehealth: Payer: Self-pay | Admitting: Family Medicine

## 2015-10-07 NOTE — Telephone Encounter (Signed)
Pt is requesting results from cxr done in November  cb number is [phone] Thank you

## 2015-10-07 NOTE — Telephone Encounter (Signed)
Message left advising patient.  

## 2015-10-22 ENCOUNTER — Ambulatory Visit: Payer: Medicare Other | Admitting: Internal Medicine

## 2015-10-28 ENCOUNTER — Telehealth: Payer: Self-pay | Admitting: Family Medicine

## 2015-10-28 DIAGNOSIS — R05 Cough: Secondary | ICD-10-CM | POA: Diagnosis not present

## 2015-10-28 DIAGNOSIS — K219 Gastro-esophageal reflux disease without esophagitis: Secondary | ICD-10-CM | POA: Diagnosis not present

## 2015-10-28 DIAGNOSIS — E119 Type 2 diabetes mellitus without complications: Secondary | ICD-10-CM | POA: Diagnosis not present

## 2015-10-28 DIAGNOSIS — E785 Hyperlipidemia, unspecified: Secondary | ICD-10-CM | POA: Diagnosis not present

## 2015-10-28 NOTE — Telephone Encounter (Signed)
Called and notified patient of Sarah Velasquez's comments. Patient verbalized understanding.  

## 2015-10-28 NOTE — Telephone Encounter (Signed)
Pt is switching to [facility]. Pt states she never received call with spirometry results. She would like call back with results.  Dr. Darnell Level taken off as PCP in Epic

## 2015-10-28 NOTE — Telephone Encounter (Signed)
Normal Spirometry.

## 2015-10-28 NOTE — Telephone Encounter (Signed)
See prev note, Rout to Allie Bossier, NP she saw pt on 07/12/15 and spirometry was done then

## 2015-12-17 DIAGNOSIS — N390 Urinary tract infection, site not specified: Secondary | ICD-10-CM | POA: Diagnosis not present

## 2016-01-17 ENCOUNTER — Other Ambulatory Visit: Payer: Medicare Other

## 2016-01-20 ENCOUNTER — Ambulatory Visit: Payer: Medicare Other | Admitting: Family Medicine

## 2016-01-26 DIAGNOSIS — R05 Cough: Secondary | ICD-10-CM | POA: Diagnosis not present

## 2016-01-26 DIAGNOSIS — E119 Type 2 diabetes mellitus without complications: Secondary | ICD-10-CM | POA: Diagnosis not present

## 2016-01-26 DIAGNOSIS — E785 Hyperlipidemia, unspecified: Secondary | ICD-10-CM | POA: Diagnosis not present

## 2016-02-08 IMAGING — CR DG CHEST 2V
2 series · 2 of 2 positions shown · non-contrast
Comparison: Comparison made to prior study 01/13/2015.

CLINICAL DATA: Chronic cough for 1 year.

EXAM:
CHEST  2 VIEW

[view not recorded (1 of 2)]
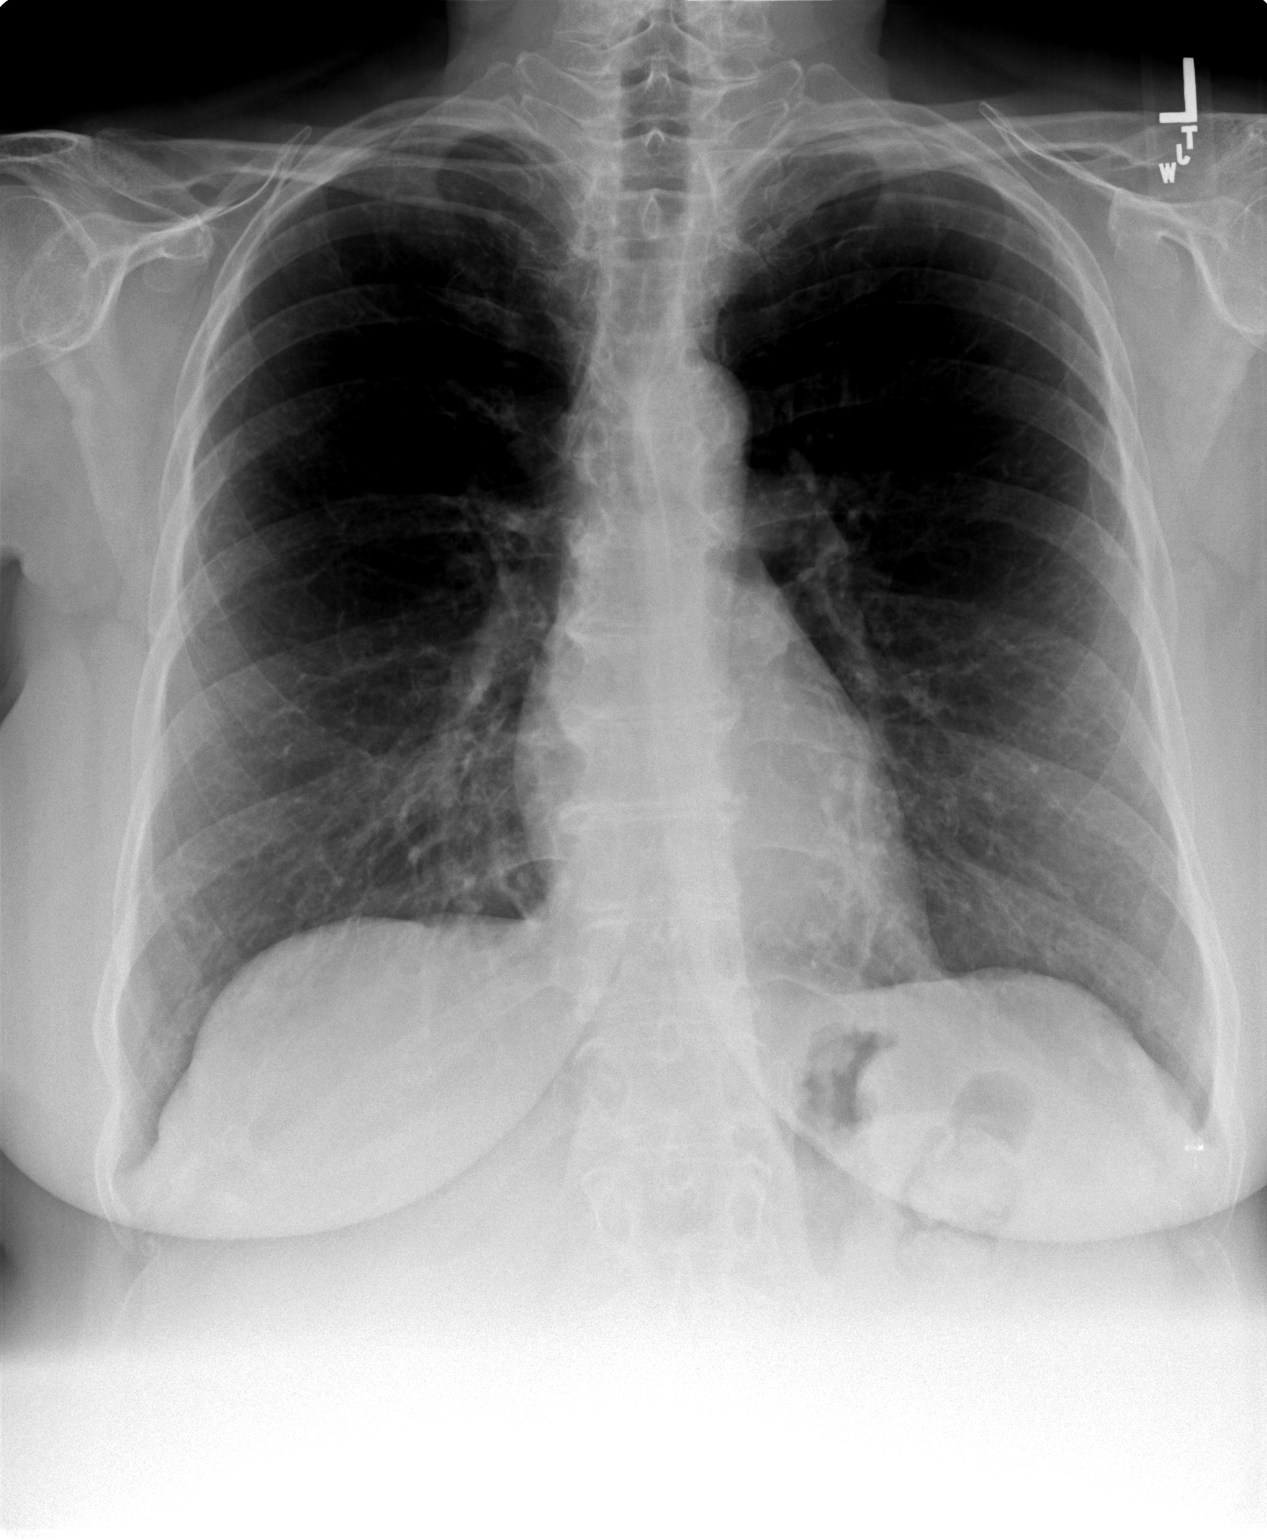

[view not recorded (2 of 2)]
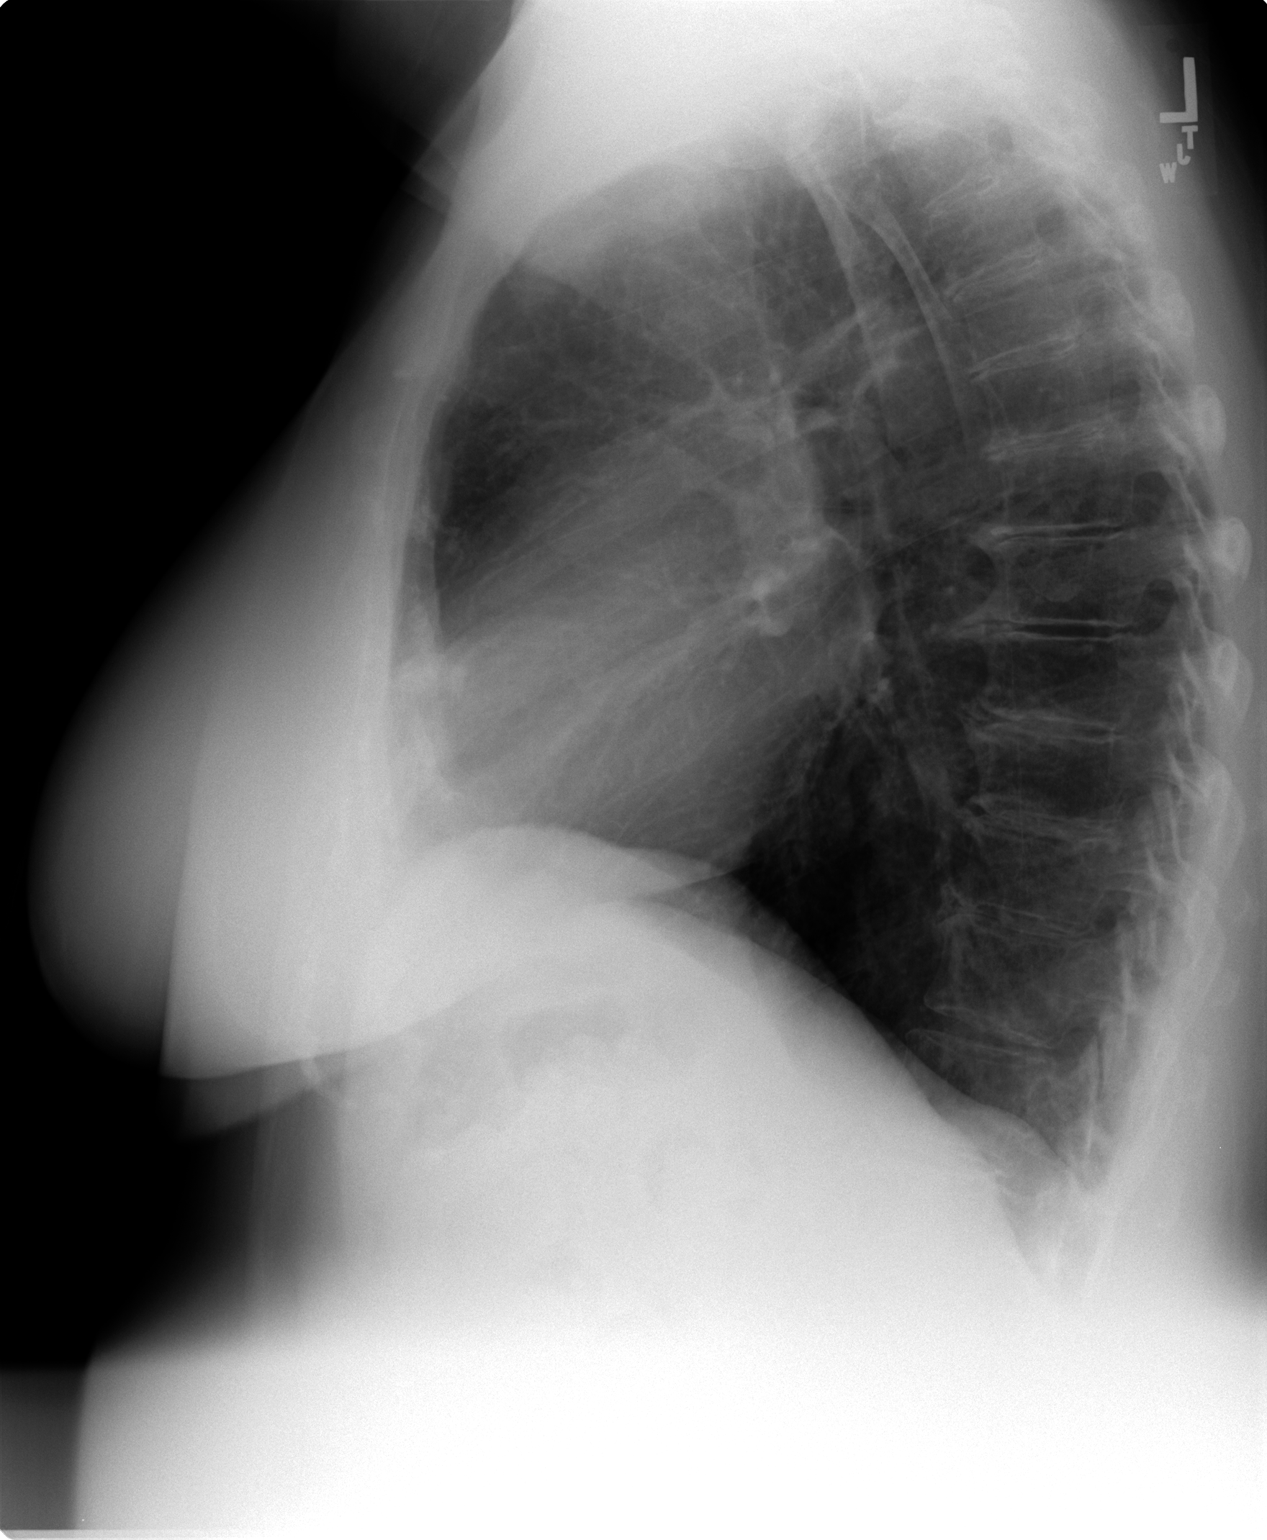

[2 of 2 positions shown; findings below may reference images not displayed]

FINDINGS: The heart size and mediastinal contours are within normal limits.
Both lungs are clear. Degenerative changes thoracic spine.
IMPRESSION: No active cardiopulmonary disease.  Exam stable from prior exam.

## 2016-02-14 DIAGNOSIS — N390 Urinary tract infection, site not specified: Secondary | ICD-10-CM | POA: Diagnosis not present

## 2016-04-11 DIAGNOSIS — M65332 Trigger finger, left middle finger: Secondary | ICD-10-CM | POA: Diagnosis not present

## 2016-04-11 DIAGNOSIS — M65342 Trigger finger, left ring finger: Secondary | ICD-10-CM | POA: Diagnosis not present

## 2016-04-11 DIAGNOSIS — M65311 Trigger thumb, right thumb: Secondary | ICD-10-CM | POA: Diagnosis not present

## 2016-05-17 DIAGNOSIS — M65311 Trigger thumb, right thumb: Secondary | ICD-10-CM | POA: Diagnosis not present

## 2016-05-17 DIAGNOSIS — M65312 Trigger thumb, left thumb: Secondary | ICD-10-CM | POA: Diagnosis not present

## 2016-05-17 DIAGNOSIS — M65332 Trigger finger, left middle finger: Secondary | ICD-10-CM | POA: Diagnosis not present

## 2016-05-17 DIAGNOSIS — M65342 Trigger finger, left ring finger: Secondary | ICD-10-CM | POA: Diagnosis not present

## 2016-05-27 DIAGNOSIS — M545 Low back pain: Secondary | ICD-10-CM | POA: Diagnosis not present

## 2016-05-27 DIAGNOSIS — G8929 Other chronic pain: Secondary | ICD-10-CM | POA: Diagnosis not present

## 2016-06-06 DIAGNOSIS — G8929 Other chronic pain: Secondary | ICD-10-CM | POA: Diagnosis not present

## 2016-06-06 DIAGNOSIS — M545 Low back pain: Secondary | ICD-10-CM | POA: Diagnosis not present

## 2016-06-07 ENCOUNTER — Other Ambulatory Visit: Payer: Medicare Other

## 2016-06-08 ENCOUNTER — Other Ambulatory Visit: Payer: Self-pay | Admitting: Physician Assistant

## 2016-06-08 DIAGNOSIS — S22070A Wedge compression fracture of T9-T10 vertebra, initial encounter for closed fracture: Secondary | ICD-10-CM

## 2016-06-13 DIAGNOSIS — S22080A Wedge compression fracture of T11-T12 vertebra, initial encounter for closed fracture: Secondary | ICD-10-CM | POA: Diagnosis not present

## 2016-06-14 ENCOUNTER — Encounter: Payer: Medicare Other | Admitting: Family Medicine

## 2016-06-14 DIAGNOSIS — M65342 Trigger finger, left ring finger: Secondary | ICD-10-CM | POA: Diagnosis not present

## 2016-06-14 DIAGNOSIS — M65332 Trigger finger, left middle finger: Secondary | ICD-10-CM | POA: Diagnosis not present

## 2016-06-16 ENCOUNTER — Other Ambulatory Visit: Payer: Self-pay | Admitting: Neurological Surgery

## 2016-06-20 DIAGNOSIS — M48061 Spinal stenosis, lumbar region without neurogenic claudication: Secondary | ICD-10-CM | POA: Diagnosis not present

## 2016-06-20 DIAGNOSIS — M47816 Spondylosis without myelopathy or radiculopathy, lumbar region: Secondary | ICD-10-CM | POA: Diagnosis not present

## 2016-06-20 DIAGNOSIS — M5136 Other intervertebral disc degeneration, lumbar region: Secondary | ICD-10-CM | POA: Diagnosis not present

## 2016-06-20 DIAGNOSIS — S22080A Wedge compression fracture of T11-T12 vertebra, initial encounter for closed fracture: Secondary | ICD-10-CM | POA: Diagnosis not present

## 2016-08-03 DIAGNOSIS — Z23 Encounter for immunization: Secondary | ICD-10-CM | POA: Diagnosis not present

## 2016-08-08 DIAGNOSIS — M65311 Trigger thumb, right thumb: Secondary | ICD-10-CM | POA: Diagnosis not present

## 2016-08-17 DIAGNOSIS — E785 Hyperlipidemia, unspecified: Secondary | ICD-10-CM | POA: Diagnosis not present

## 2016-08-17 DIAGNOSIS — E119 Type 2 diabetes mellitus without complications: Secondary | ICD-10-CM | POA: Diagnosis not present

## 2016-08-18 DIAGNOSIS — R300 Dysuria: Secondary | ICD-10-CM | POA: Diagnosis not present

## 2016-08-18 DIAGNOSIS — N390 Urinary tract infection, site not specified: Secondary | ICD-10-CM | POA: Diagnosis not present

## 2016-10-17 ENCOUNTER — Emergency Department: Payer: No Typology Code available for payment source

## 2016-10-17 ENCOUNTER — Emergency Department
Admission: EM | Admit: 2016-10-17 | Discharge: 2016-10-17 | Disposition: A | Discharge status: Home / Self Care | Payer: No Typology Code available for payment source | Attending: Emergency Medicine | Admitting: Emergency Medicine

## 2016-10-17 DIAGNOSIS — Z79899 Other long term (current) drug therapy: Secondary | ICD-10-CM | POA: Insufficient documentation

## 2016-10-17 DIAGNOSIS — M546 Pain in thoracic spine: Secondary | ICD-10-CM | POA: Diagnosis not present

## 2016-10-17 DIAGNOSIS — Y9389 Activity, other specified: Secondary | ICD-10-CM | POA: Insufficient documentation

## 2016-10-17 DIAGNOSIS — Y92481 Parking lot as the place of occurrence of the external cause: Secondary | ICD-10-CM | POA: Insufficient documentation

## 2016-10-17 DIAGNOSIS — Z7982 Long term (current) use of aspirin: Secondary | ICD-10-CM | POA: Diagnosis not present

## 2016-10-17 DIAGNOSIS — Y999 Unspecified external cause status: Secondary | ICD-10-CM | POA: Insufficient documentation

## 2016-10-17 DIAGNOSIS — S299XXA Unspecified injury of thorax, initial encounter: Secondary | ICD-10-CM | POA: Diagnosis not present

## 2016-10-17 DIAGNOSIS — E119 Type 2 diabetes mellitus without complications: Secondary | ICD-10-CM | POA: Diagnosis not present
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
## 2016-12-13 DIAGNOSIS — M65311 Trigger thumb, right thumb: Secondary | ICD-10-CM | POA: Diagnosis not present

## 2016-12-13 DIAGNOSIS — E119 Type 2 diabetes mellitus without complications: Secondary | ICD-10-CM | POA: Diagnosis not present

## 2016-12-13 DIAGNOSIS — E785 Hyperlipidemia, unspecified: Secondary | ICD-10-CM | POA: Diagnosis not present

## 2016-12-17 DIAGNOSIS — R399 Unspecified symptoms and signs involving the genitourinary system: Secondary | ICD-10-CM | POA: Diagnosis not present

## 2017-04-19 ENCOUNTER — Other Ambulatory Visit: Payer: Self-pay | Admitting: Family Medicine

## 2017-04-19 DIAGNOSIS — E119 Type 2 diabetes mellitus without complications: Secondary | ICD-10-CM | POA: Diagnosis not present

## 2017-04-19 DIAGNOSIS — Z1231 Encounter for screening mammogram for malignant neoplasm of breast: Secondary | ICD-10-CM

## 2017-04-19 DIAGNOSIS — Z780 Asymptomatic menopausal state: Secondary | ICD-10-CM | POA: Diagnosis not present

## 2017-04-19 DIAGNOSIS — Z0000 Encounter for general adult medical examination without abnormal findings: Secondary | ICD-10-CM | POA: Diagnosis not present

## 2017-04-19 DIAGNOSIS — E785 Hyperlipidemia, unspecified: Secondary | ICD-10-CM | POA: Diagnosis not present

## 2017-04-20 ENCOUNTER — Ambulatory Visit
Admission: RE | Admit: 2017-04-20 | Discharge: 2017-04-20 | Disposition: A | Discharge status: Home / Self Care | Payer: PPO | Source: Ambulatory Visit | Attending: Family Medicine | Admitting: Family Medicine

## 2017-04-20 DIAGNOSIS — Z1231 Encounter for screening mammogram for malignant neoplasm of breast: Secondary | ICD-10-CM | POA: Diagnosis not present

## 2017-04-20 DIAGNOSIS — E785 Hyperlipidemia, unspecified: Secondary | ICD-10-CM | POA: Diagnosis not present

## 2017-04-20 DIAGNOSIS — E119 Type 2 diabetes mellitus without complications: Secondary | ICD-10-CM | POA: Diagnosis not present

## 2017-07-23 DIAGNOSIS — M7711 Lateral epicondylitis, right elbow: Secondary | ICD-10-CM | POA: Diagnosis not present

## 2017-07-23 DIAGNOSIS — M25521 Pain in right elbow: Secondary | ICD-10-CM | POA: Diagnosis not present

## 2017-07-23 DIAGNOSIS — M19021 Primary osteoarthritis, right elbow: Secondary | ICD-10-CM | POA: Diagnosis not present

## 2017-07-25 DIAGNOSIS — M8588 Other specified disorders of bone density and structure, other site: Secondary | ICD-10-CM | POA: Diagnosis not present

## 2017-07-25 DIAGNOSIS — Z780 Asymptomatic menopausal state: Secondary | ICD-10-CM | POA: Diagnosis not present

## 2017-08-30 DIAGNOSIS — E785 Hyperlipidemia, unspecified: Secondary | ICD-10-CM | POA: Diagnosis not present

## 2017-08-30 DIAGNOSIS — E119 Type 2 diabetes mellitus without complications: Secondary | ICD-10-CM | POA: Diagnosis not present

## 2017-08-30 DIAGNOSIS — F4321 Adjustment disorder with depressed mood: Secondary | ICD-10-CM | POA: Diagnosis not present

## 2017-10-19 DIAGNOSIS — E119 Type 2 diabetes mellitus without complications: Secondary | ICD-10-CM | POA: Diagnosis not present

## 2017-10-19 DIAGNOSIS — F4321 Adjustment disorder with depressed mood: Secondary | ICD-10-CM | POA: Diagnosis not present

## 2017-10-19 DIAGNOSIS — E785 Hyperlipidemia, unspecified: Secondary | ICD-10-CM | POA: Diagnosis not present

## 2018-01-09 DIAGNOSIS — E119 Type 2 diabetes mellitus without complications: Secondary | ICD-10-CM | POA: Diagnosis not present

## 2018-01-09 DIAGNOSIS — E785 Hyperlipidemia, unspecified: Secondary | ICD-10-CM | POA: Diagnosis not present

## 2018-01-16 DIAGNOSIS — E119 Type 2 diabetes mellitus without complications: Secondary | ICD-10-CM | POA: Diagnosis not present

## 2018-01-16 DIAGNOSIS — F4321 Adjustment disorder with depressed mood: Secondary | ICD-10-CM | POA: Diagnosis not present

## 2018-01-16 DIAGNOSIS — E785 Hyperlipidemia, unspecified: Secondary | ICD-10-CM | POA: Diagnosis not present

## 2018-03-15 DIAGNOSIS — H8110 Benign paroxysmal vertigo, unspecified ear: Secondary | ICD-10-CM | POA: Diagnosis not present

## 2018-04-19 ENCOUNTER — Ambulatory Visit: Payer: PPO | Attending: Family Medicine

## 2018-04-19 DIAGNOSIS — H8111 Benign paroxysmal vertigo, right ear: Secondary | ICD-10-CM | POA: Insufficient documentation

## 2018-04-19 DIAGNOSIS — M542 Cervicalgia: Secondary | ICD-10-CM | POA: Diagnosis not present
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
## 2018-04-23 ENCOUNTER — Encounter: Payer: Self-pay | Admitting: Physical Therapy

## 2018-04-23 ENCOUNTER — Ambulatory Visit: Payer: PPO | Admitting: Physical Therapy

## 2018-04-23 DIAGNOSIS — H8111 Benign paroxysmal vertigo, right ear: Secondary | ICD-10-CM

## 2018-04-23 DIAGNOSIS — M542 Cervicalgia: Secondary | ICD-10-CM

## 2018-04-23 NOTE — Patient Instructions (Signed)
Medbridge access code: [email or web address]  Seated Upper Trapezius Stretch REPS: 3  SETS: 1  HOLD: 30 seconds  WEEKLY: 7x  DAILY: 2x Clinician Notes: Do 3 reps on each side (left and right) Setup Begin sitting upright holding onto your chair with one hand. Movement Use your other hand to apply a light pressure on the opposite side of your head and gently pull your ear toward your shoulder. Hold, then repeat. You should feel a stretch in the side of your neck. Tip Make sure to sit up straight and do not let your neck rotate, or bend forward or backward during the exercise.
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
## 2018-04-25 ENCOUNTER — Ambulatory Visit: Payer: PPO | Admitting: Physical Therapy

## 2018-04-25 ENCOUNTER — Encounter: Payer: Self-pay | Admitting: Physical Therapy

## 2018-04-25 DIAGNOSIS — M542 Cervicalgia: Secondary | ICD-10-CM

## 2018-04-25 DIAGNOSIS — H8111 Benign paroxysmal vertigo, right ear: Secondary | ICD-10-CM | POA: Diagnosis not present
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
## 2018-04-25 NOTE — Patient Instructions (Addendum)
     Roll   Inhale and bring shoulders up, back, then exhale and relax shoulders down. Repeat _10__ times. Do _2-3__ times per day.  Copyright  VHI. All rights reserved.    Flexors, Supine    Lie on back, head on small, rolled towel. Tip chin down. Tighten muscles in back of throat. Hold ___ seconds. Repeat ___ times per session. Do ___ sessions per day.  Copyright  VHI. All rights reserved.   Head Rotation   Slowly turn head to one side, then the other. Hold _3__ seconds. Repeat _10__ times each side, alternating. Do __2_ sessions per day.  Copyright  VHI. All rights reserved.  Neck Side-Bending   Begin with chin level, head centered over spine. Slowly lower one ear toward shoulder. Hold __3__ seconds. Slowly return to starting position. Repeat to other side. Repeat __10__ times to each side.  Copyright  VHI. All rights reserved.    Copyright  VHI. All rights reserved.  Neck Flexion   Begin with chin level, head centered over spine. Slowly lower chin toward chest. Hold _3___ seconds. Slowly return to starting position. Repeat __10__ times.  Copyright  VHI. All rights reserved.  AROM: Neck Extension   Bend head backward. Hold _3___ seconds. Repeat _10___ times per set. Do __1__ sets per session. Do _2__ sessions per day.  http://orth.exer.us/301   Copyright  VHI. All rights reserved.  Sitting  Neck: Retraction   Sit with back and head straight. Pull chin back to line up ear with shoulder. Do not turn or tilt head. You can use your hand to help if needed. Hold _5___ seconds. Repeat _10___ times. Do __2__ sessions per day. CAUTION: Movement should be gentle, steady and slow.  Copyright  VHI. All rights reserved.  Scapular Retraction (Standing)    With arms at sides, pinch shoulder blades together. Repeat _10___ times per set. Do __2__ sets per session. Do _2___ sessions per day.  http://orth.exer.us/945   Copyright  VHI. All rights  reserved.

## 2018-04-26 ENCOUNTER — Ambulatory Visit: Payer: PPO | Admitting: Physical Therapy

## 2018-04-30 ENCOUNTER — Encounter: Payer: Medicare Other | Admitting: Physical Therapy

## 2018-04-30 ENCOUNTER — Ambulatory Visit: Payer: PPO | Admitting: Physical Therapy

## 2018-04-30 ENCOUNTER — Encounter: Payer: PPO | Admitting: Physical Therapy

## 2018-05-02 ENCOUNTER — Encounter: Payer: Self-pay | Admitting: Physical Therapy

## 2018-05-02 ENCOUNTER — Ambulatory Visit: Payer: PPO | Admitting: Physical Therapy

## 2018-05-02 DIAGNOSIS — M542 Cervicalgia: Secondary | ICD-10-CM

## 2018-05-02 DIAGNOSIS — H8111 Benign paroxysmal vertigo, right ear: Secondary | ICD-10-CM | POA: Diagnosis not present
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
## 2018-05-03 DIAGNOSIS — R42 Dizziness and giddiness: Secondary | ICD-10-CM | POA: Diagnosis not present

## 2018-05-07 ENCOUNTER — Ambulatory Visit: Payer: PPO | Attending: Family Medicine | Admitting: Physical Therapy

## 2018-05-07 ENCOUNTER — Encounter: Payer: Self-pay | Admitting: Physical Therapy

## 2018-05-07 DIAGNOSIS — M542 Cervicalgia: Secondary | ICD-10-CM | POA: Diagnosis not present
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
## 2018-05-09 ENCOUNTER — Ambulatory Visit: Payer: PPO | Admitting: Physical Therapy

## 2018-05-10 ENCOUNTER — Encounter: Payer: Medicare Other | Admitting: Physical Therapy

## 2018-05-14 ENCOUNTER — Ambulatory Visit: Payer: PPO | Admitting: Physical Therapy

## 2018-05-14 ENCOUNTER — Encounter: Payer: Self-pay | Admitting: Physical Therapy

## 2018-05-14 DIAGNOSIS — M542 Cervicalgia: Secondary | ICD-10-CM
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
## 2018-05-16 ENCOUNTER — Ambulatory Visit: Payer: PPO | Admitting: Physical Therapy

## 2018-05-16 ENCOUNTER — Encounter: Payer: Self-pay | Admitting: Physical Therapy

## 2018-05-16 DIAGNOSIS — M542 Cervicalgia: Secondary | ICD-10-CM | POA: Diagnosis not present
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
## 2018-05-17 ENCOUNTER — Encounter: Payer: Medicare Other | Admitting: Physical Therapy

## 2018-05-21 ENCOUNTER — Ambulatory Visit: Payer: PPO | Admitting: Physical Therapy

## 2018-05-23 ENCOUNTER — Ambulatory Visit: Payer: PPO | Admitting: Physical Therapy

## 2018-05-24 ENCOUNTER — Encounter: Payer: Medicare Other | Admitting: Physical Therapy

## 2018-05-28 ENCOUNTER — Ambulatory Visit: Payer: PPO | Admitting: Physical Therapy

## 2018-05-30 ENCOUNTER — Ambulatory Visit: Payer: PPO | Admitting: Physical Therapy

## 2018-05-31 ENCOUNTER — Encounter: Payer: Medicare Other | Admitting: Physical Therapy

## 2018-06-04 ENCOUNTER — Ambulatory Visit: Payer: PPO | Admitting: Physical Therapy

## 2018-06-06 ENCOUNTER — Ambulatory Visit: Payer: PPO | Admitting: Physical Therapy

## 2018-09-19 DIAGNOSIS — M8580 Other specified disorders of bone density and structure, unspecified site: Secondary | ICD-10-CM | POA: Diagnosis not present

## 2018-09-19 DIAGNOSIS — E785 Hyperlipidemia, unspecified: Secondary | ICD-10-CM | POA: Diagnosis not present

## 2018-09-19 DIAGNOSIS — E119 Type 2 diabetes mellitus without complications: Secondary | ICD-10-CM | POA: Diagnosis not present

## 2018-09-26 DIAGNOSIS — E785 Hyperlipidemia, unspecified: Secondary | ICD-10-CM | POA: Diagnosis not present

## 2018-09-26 DIAGNOSIS — Z0000 Encounter for general adult medical examination without abnormal findings: Secondary | ICD-10-CM | POA: Diagnosis not present

## 2018-09-26 DIAGNOSIS — Z23 Encounter for immunization: Secondary | ICD-10-CM | POA: Diagnosis not present

## 2018-09-26 DIAGNOSIS — E1169 Type 2 diabetes mellitus with other specified complication: Secondary | ICD-10-CM | POA: Diagnosis not present

## 2018-10-01 DIAGNOSIS — E119 Type 2 diabetes mellitus without complications: Secondary | ICD-10-CM | POA: Diagnosis not present

## 2018-11-05 DIAGNOSIS — R413 Other amnesia: Secondary | ICD-10-CM | POA: Diagnosis not present

## 2018-11-05 DIAGNOSIS — F4321 Adjustment disorder with depressed mood: Secondary | ICD-10-CM | POA: Diagnosis not present

## 2019-03-10 DIAGNOSIS — H25813 Combined forms of age-related cataract, bilateral: Secondary | ICD-10-CM | POA: Diagnosis not present

## 2019-03-10 DIAGNOSIS — D3132 Benign neoplasm of left choroid: Secondary | ICD-10-CM | POA: Diagnosis not present

## 2019-03-20 DIAGNOSIS — E785 Hyperlipidemia, unspecified: Secondary | ICD-10-CM | POA: Diagnosis not present

## 2019-03-20 DIAGNOSIS — E1169 Type 2 diabetes mellitus with other specified complication: Secondary | ICD-10-CM | POA: Diagnosis not present

## 2019-07-03 DIAGNOSIS — F4321 Adjustment disorder with depressed mood: Secondary | ICD-10-CM | POA: Diagnosis not present

## 2019-07-03 DIAGNOSIS — E785 Hyperlipidemia, unspecified: Secondary | ICD-10-CM | POA: Diagnosis not present

## 2019-07-03 DIAGNOSIS — E1169 Type 2 diabetes mellitus with other specified complication: Secondary | ICD-10-CM | POA: Diagnosis not present

## 2019-08-08 DIAGNOSIS — R4189 Other symptoms and signs involving cognitive functions and awareness: Secondary | ICD-10-CM | POA: Diagnosis not present

## 2019-09-02 ENCOUNTER — Ambulatory Visit: Payer: PPO | Attending: Internal Medicine

## 2019-09-02 DIAGNOSIS — Z20822 Contact with and (suspected) exposure to covid-19: Secondary | ICD-10-CM

## 2019-09-02 DIAGNOSIS — Z20828 Contact with and (suspected) exposure to other viral communicable diseases: Secondary | ICD-10-CM | POA: Diagnosis not present

## 2019-09-04 ENCOUNTER — Telehealth: Payer: Self-pay

## 2019-09-04 LAB — NOVEL CORONAVIRUS, NAA: SARS-CoV-2, NAA: NOT DETECTED

## 2019-09-04 NOTE — Telephone Encounter (Signed)
Negative COVID results given. Patient results "NOT Detected." Caller expressed understanding. ° °

## 2019-09-19 DIAGNOSIS — F4321 Adjustment disorder with depressed mood: Secondary | ICD-10-CM | POA: Diagnosis not present

## 2019-09-19 DIAGNOSIS — R4189 Other symptoms and signs involving cognitive functions and awareness: Secondary | ICD-10-CM | POA: Diagnosis not present

## 2019-10-28 DIAGNOSIS — E1169 Type 2 diabetes mellitus with other specified complication: Secondary | ICD-10-CM | POA: Diagnosis not present

## 2019-10-28 DIAGNOSIS — E785 Hyperlipidemia, unspecified: Secondary | ICD-10-CM | POA: Diagnosis not present

## 2019-11-04 DIAGNOSIS — E785 Hyperlipidemia, unspecified: Secondary | ICD-10-CM | POA: Diagnosis not present

## 2019-11-04 DIAGNOSIS — E1169 Type 2 diabetes mellitus with other specified complication: Secondary | ICD-10-CM | POA: Diagnosis not present

## 2019-11-04 DIAGNOSIS — F4321 Adjustment disorder with depressed mood: Secondary | ICD-10-CM | POA: Diagnosis not present

## 2019-11-04 DIAGNOSIS — Z0000 Encounter for general adult medical examination without abnormal findings: Secondary | ICD-10-CM | POA: Diagnosis not present

## 2019-11-14 DIAGNOSIS — H9202 Otalgia, left ear: Secondary | ICD-10-CM | POA: Diagnosis not present

## 2019-11-14 DIAGNOSIS — H6982 Other specified disorders of Eustachian tube, left ear: Secondary | ICD-10-CM | POA: Diagnosis not present

## 2019-11-19 DIAGNOSIS — R4189 Other symptoms and signs involving cognitive functions and awareness: Secondary | ICD-10-CM | POA: Diagnosis not present

## 2019-11-20 ENCOUNTER — Other Ambulatory Visit (HOSPITAL_COMMUNITY): Payer: Self-pay | Admitting: Neurology

## 2019-11-20 DIAGNOSIS — R4189 Other symptoms and signs involving cognitive functions and awareness: Secondary | ICD-10-CM

## 2019-12-10 ENCOUNTER — Ambulatory Visit (HOSPITAL_COMMUNITY)
Admission: RE | Admit: 2019-12-10 | Discharge: 2019-12-10 | Disposition: A | Discharge status: Home / Self Care | Payer: PPO | Source: Ambulatory Visit | Attending: Neurology | Admitting: Neurology

## 2019-12-10 ENCOUNTER — Other Ambulatory Visit: Payer: Self-pay

## 2019-12-10 DIAGNOSIS — R930 Abnormal findings on diagnostic imaging of skull and head, not elsewhere classified: Secondary | ICD-10-CM | POA: Diagnosis not present

## 2019-12-10 DIAGNOSIS — G3184 Mild cognitive impairment, so stated: Secondary | ICD-10-CM | POA: Diagnosis not present

## 2019-12-10 DIAGNOSIS — R4189 Other symptoms and signs involving cognitive functions and awareness: Secondary | ICD-10-CM | POA: Diagnosis not present

## 2020-04-06 DIAGNOSIS — R4189 Other symptoms and signs involving cognitive functions and awareness: Secondary | ICD-10-CM | POA: Diagnosis not present

## 2020-04-06 DIAGNOSIS — Z79899 Other long term (current) drug therapy: Secondary | ICD-10-CM | POA: Diagnosis not present

## 2020-04-29 DIAGNOSIS — E1169 Type 2 diabetes mellitus with other specified complication: Secondary | ICD-10-CM | POA: Diagnosis not present

## 2020-04-29 DIAGNOSIS — E785 Hyperlipidemia, unspecified: Secondary | ICD-10-CM | POA: Diagnosis not present

## 2020-05-06 DIAGNOSIS — Z0000 Encounter for general adult medical examination without abnormal findings: Secondary | ICD-10-CM | POA: Diagnosis not present

## 2020-05-06 DIAGNOSIS — E785 Hyperlipidemia, unspecified: Secondary | ICD-10-CM | POA: Diagnosis not present

## 2020-05-06 DIAGNOSIS — E1169 Type 2 diabetes mellitus with other specified complication: Secondary | ICD-10-CM | POA: Diagnosis not present

## 2020-05-06 DIAGNOSIS — F4321 Adjustment disorder with depressed mood: Secondary | ICD-10-CM | POA: Diagnosis not present

## 2020-05-12 ENCOUNTER — Emergency Department: Payer: PPO

## 2020-05-12 ENCOUNTER — Inpatient Hospital Stay
Admission: EM | Admit: 2020-05-12 | Discharge: 2020-05-19 | DRG: 480 | Disposition: A | Discharge status: Skilled Nursing Facility | Payer: PPO | Attending: Internal Medicine | Admitting: Internal Medicine

## 2020-05-12 ENCOUNTER — Encounter: Payer: Self-pay | Admitting: Emergency Medicine

## 2020-05-12 ENCOUNTER — Other Ambulatory Visit: Payer: Self-pay

## 2020-05-12 DIAGNOSIS — S42252A Displaced fracture of greater tuberosity of left humerus, initial encounter for closed fracture: Principal | ICD-10-CM | POA: Diagnosis present

## 2020-05-12 DIAGNOSIS — I10 Essential (primary) hypertension: Secondary | ICD-10-CM | POA: Diagnosis not present

## 2020-05-12 DIAGNOSIS — R410 Disorientation, unspecified: Secondary | ICD-10-CM

## 2020-05-12 DIAGNOSIS — S72142A Displaced intertrochanteric fracture of left femur, initial encounter for closed fracture: Secondary | ICD-10-CM | POA: Diagnosis not present

## 2020-05-12 DIAGNOSIS — S72142D Displaced intertrochanteric fracture of left femur, subsequent encounter for closed fracture with routine healing: Secondary | ICD-10-CM | POA: Diagnosis not present

## 2020-05-12 DIAGNOSIS — E876 Hypokalemia: Secondary | ICD-10-CM | POA: Diagnosis not present

## 2020-05-12 DIAGNOSIS — Z888 Allergy status to other drugs, medicaments and biological substances status: Secondary | ICD-10-CM

## 2020-05-12 DIAGNOSIS — R451 Restlessness and agitation: Secondary | ICD-10-CM | POA: Diagnosis not present

## 2020-05-12 DIAGNOSIS — R339 Retention of urine, unspecified: Secondary | ICD-10-CM | POA: Diagnosis not present

## 2020-05-12 DIAGNOSIS — M519 Unspecified thoracic, thoracolumbar and lumbosacral intervertebral disc disorder: Secondary | ICD-10-CM | POA: Diagnosis not present

## 2020-05-12 DIAGNOSIS — Z043 Encounter for examination and observation following other accident: Secondary | ICD-10-CM | POA: Diagnosis not present

## 2020-05-12 DIAGNOSIS — F419 Anxiety disorder, unspecified: Secondary | ICD-10-CM | POA: Diagnosis not present

## 2020-05-12 DIAGNOSIS — M25559 Pain in unspecified hip: Secondary | ICD-10-CM

## 2020-05-12 DIAGNOSIS — S7292XA Unspecified fracture of left femur, initial encounter for closed fracture: Secondary | ICD-10-CM | POA: Diagnosis not present

## 2020-05-12 DIAGNOSIS — E861 Hypovolemia: Secondary | ICD-10-CM | POA: Diagnosis not present

## 2020-05-12 DIAGNOSIS — Y92019 Unspecified place in single-family (private) house as the place of occurrence of the external cause: Secondary | ICD-10-CM | POA: Diagnosis not present

## 2020-05-12 DIAGNOSIS — Z20822 Contact with and (suspected) exposure to covid-19: Secondary | ICD-10-CM | POA: Diagnosis not present

## 2020-05-12 DIAGNOSIS — Y92009 Unspecified place in unspecified non-institutional (private) residence as the place of occurrence of the external cause: Secondary | ICD-10-CM | POA: Diagnosis not present

## 2020-05-12 DIAGNOSIS — F329 Major depressive disorder, single episode, unspecified: Secondary | ICD-10-CM | POA: Diagnosis not present

## 2020-05-12 DIAGNOSIS — W19XXXA Unspecified fall, initial encounter: Secondary | ICD-10-CM

## 2020-05-12 DIAGNOSIS — D62 Acute posthemorrhagic anemia: Secondary | ICD-10-CM | POA: Diagnosis not present

## 2020-05-12 DIAGNOSIS — F32A Depression, unspecified: Secondary | ICD-10-CM | POA: Insufficient documentation

## 2020-05-12 DIAGNOSIS — W010XXA Fall on same level from slipping, tripping and stumbling without subsequent striking against object, initial encounter: Secondary | ICD-10-CM | POA: Diagnosis present

## 2020-05-12 DIAGNOSIS — Z808 Family history of malignant neoplasm of other organs or systems: Secondary | ICD-10-CM

## 2020-05-12 DIAGNOSIS — E782 Mixed hyperlipidemia: Secondary | ICD-10-CM | POA: Diagnosis not present

## 2020-05-12 DIAGNOSIS — S43015A Anterior dislocation of left humerus, initial encounter: Secondary | ICD-10-CM | POA: Diagnosis present

## 2020-05-12 DIAGNOSIS — E119 Type 2 diabetes mellitus without complications: Secondary | ICD-10-CM | POA: Diagnosis not present

## 2020-05-12 DIAGNOSIS — Z881 Allergy status to other antibiotic agents status: Secondary | ICD-10-CM

## 2020-05-12 DIAGNOSIS — S4292XA Fracture of left shoulder girdle, part unspecified, initial encounter for closed fracture: Secondary | ICD-10-CM | POA: Diagnosis present

## 2020-05-12 DIAGNOSIS — Z7982 Long term (current) use of aspirin: Secondary | ICD-10-CM

## 2020-05-12 DIAGNOSIS — E860 Dehydration: Secondary | ICD-10-CM | POA: Diagnosis not present

## 2020-05-12 DIAGNOSIS — R55 Syncope and collapse: Secondary | ICD-10-CM | POA: Diagnosis not present

## 2020-05-12 DIAGNOSIS — G309 Alzheimer's disease, unspecified: Secondary | ICD-10-CM | POA: Diagnosis not present

## 2020-05-12 DIAGNOSIS — K219 Gastro-esophageal reflux disease without esophagitis: Secondary | ICD-10-CM

## 2020-05-12 DIAGNOSIS — Z79899 Other long term (current) drug therapy: Secondary | ICD-10-CM

## 2020-05-12 DIAGNOSIS — Z90710 Acquired absence of both cervix and uterus: Secondary | ICD-10-CM

## 2020-05-12 DIAGNOSIS — Z7984 Long term (current) use of oral hypoglycemic drugs: Secondary | ICD-10-CM | POA: Diagnosis not present

## 2020-05-12 DIAGNOSIS — M47819 Spondylosis without myelopathy or radiculopathy, site unspecified: Secondary | ICD-10-CM | POA: Diagnosis not present

## 2020-05-12 DIAGNOSIS — Z419 Encounter for procedure for purposes other than remedying health state, unspecified: Secondary | ICD-10-CM

## 2020-05-12 DIAGNOSIS — Z89029 Acquired absence of unspecified finger(s): Secondary | ICD-10-CM

## 2020-05-12 DIAGNOSIS — M8580 Other specified disorders of bone density and structure, unspecified site: Secondary | ICD-10-CM | POA: Diagnosis not present

## 2020-05-12 DIAGNOSIS — M62838 Other muscle spasm: Secondary | ICD-10-CM | POA: Diagnosis not present

## 2020-05-12 DIAGNOSIS — I959 Hypotension, unspecified: Secondary | ICD-10-CM | POA: Diagnosis not present

## 2020-05-12 DIAGNOSIS — Z418 Encounter for other procedures for purposes other than remedying health state: Secondary | ICD-10-CM | POA: Diagnosis not present

## 2020-05-12 DIAGNOSIS — S72122D Displaced fracture of lesser trochanter of left femur, subsequent encounter for closed fracture with routine healing: Secondary | ICD-10-CM | POA: Diagnosis not present

## 2020-05-12 DIAGNOSIS — J42 Unspecified chronic bronchitis: Secondary | ICD-10-CM | POA: Diagnosis not present

## 2020-05-12 DIAGNOSIS — R112 Nausea with vomiting, unspecified: Secondary | ICD-10-CM | POA: Diagnosis not present

## 2020-05-12 DIAGNOSIS — F05 Delirium due to known physiological condition: Secondary | ICD-10-CM | POA: Diagnosis not present

## 2020-05-12 DIAGNOSIS — R42 Dizziness and giddiness: Secondary | ICD-10-CM | POA: Diagnosis not present

## 2020-05-12 DIAGNOSIS — F411 Generalized anxiety disorder: Secondary | ICD-10-CM | POA: Diagnosis not present

## 2020-05-12 DIAGNOSIS — I9589 Other hypotension: Secondary | ICD-10-CM | POA: Diagnosis not present

## 2020-05-12 DIAGNOSIS — R279 Unspecified lack of coordination: Secondary | ICD-10-CM | POA: Diagnosis not present

## 2020-05-12 DIAGNOSIS — W19XXXD Unspecified fall, subsequent encounter: Secondary | ICD-10-CM | POA: Diagnosis not present

## 2020-05-12 DIAGNOSIS — M6281 Muscle weakness (generalized): Secondary | ICD-10-CM | POA: Diagnosis not present

## 2020-05-12 DIAGNOSIS — Z825 Family history of asthma and other chronic lower respiratory diseases: Secondary | ICD-10-CM

## 2020-05-12 DIAGNOSIS — S72009A Fracture of unspecified part of neck of unspecified femur, initial encounter for closed fracture: Secondary | ICD-10-CM | POA: Diagnosis not present

## 2020-05-12 DIAGNOSIS — S0990XA Unspecified injury of head, initial encounter: Secondary | ICD-10-CM | POA: Diagnosis not present

## 2020-05-12 DIAGNOSIS — F0280 Dementia in other diseases classified elsewhere without behavioral disturbance: Secondary | ICD-10-CM | POA: Diagnosis present

## 2020-05-12 DIAGNOSIS — Z8249 Family history of ischemic heart disease and other diseases of the circulatory system: Secondary | ICD-10-CM

## 2020-05-12 DIAGNOSIS — S72102A Unspecified trochanteric fracture of left femur, initial encounter for closed fracture: Secondary | ICD-10-CM | POA: Diagnosis not present

## 2020-05-12 DIAGNOSIS — K449 Diaphragmatic hernia without obstruction or gangrene: Secondary | ICD-10-CM | POA: Diagnosis not present

## 2020-05-12 DIAGNOSIS — R5381 Other malaise: Secondary | ICD-10-CM | POA: Diagnosis not present

## 2020-05-12 DIAGNOSIS — S4292XD Fracture of left shoulder girdle, part unspecified, subsequent encounter for fracture with routine healing: Secondary | ICD-10-CM | POA: Diagnosis not present

## 2020-05-12 DIAGNOSIS — R52 Pain, unspecified: Secondary | ICD-10-CM | POA: Diagnosis not present

## 2020-05-12 LAB — CBC WITH DIFFERENTIAL/PLATELET
Abs Immature Granulocytes: 0.07 10*3/uL (ref 0.00–0.07)
Basophils Absolute: 0.1 10*3/uL (ref 0.0–0.1)
Basophils Relative: 0 %
Eosinophils Absolute: 0.1 10*3/uL (ref 0.0–0.5)
Eosinophils Relative: 0 %
HCT: 36.2 % (ref 36.0–46.0)
Hemoglobin: 12.2 g/dL (ref 12.0–15.0)
Immature Granulocytes: 0 %
Lymphocytes Relative: 3 %
Lymphs Abs: 0.6 10*3/uL — ABNORMAL LOW (ref 0.7–4.0)
MCH: 29.7 pg (ref 26.0–34.0)
MCHC: 33.7 g/dL (ref 30.0–36.0)
MCV: 88.1 fL (ref 80.0–100.0)
Monocytes Absolute: 1 10*3/uL (ref 0.1–1.0)
Monocytes Relative: 6 %
Neutro Abs: 15.6 10*3/uL — ABNORMAL HIGH (ref 1.7–7.7)
Neutrophils Relative %: 91 %
Platelets: 234 10*3/uL (ref 150–400)
RBC: 4.11 MIL/uL (ref 3.87–5.11)
RDW: 13.6 % (ref 11.5–15.5)
WBC: 17.4 10*3/uL — ABNORMAL HIGH (ref 4.0–10.5)
nRBC: 0 % (ref 0.0–0.2)

## 2020-05-12 LAB — BASIC METABOLIC PANEL
Anion gap: 13 (ref 5–15)
BUN: 21 mg/dL (ref 8–23)
CO2: 22 mmol/L (ref 22–32)
Calcium: 9.6 mg/dL (ref 8.9–10.3)
Chloride: 107 mmol/L (ref 98–111)
Creatinine, Ser: 0.9 mg/dL (ref 0.44–1.00)
GFR calc Af Amer: >60 mL/min (ref >60)
GFR calc non Af Amer: >60 mL/min (ref >60)
Glucose, Bld: 170 mg/dL — ABNORMAL HIGH (ref 70–99)
Potassium: 4.1 mmol/L (ref 3.5–5.1)
Sodium: 142 mmol/L (ref 135–145)

## 2020-05-12 LAB — APTT: aPTT: 28 seconds (ref 24–36)

## 2020-05-12 LAB — GLUCOSE, CAPILLARY: Glucose-Capillary: 174 mg/dL — ABNORMAL HIGH (ref 70–99)

## 2020-05-12 LAB — PROTIME-INR
INR: 1.1 (ref 0.8–1.2)
Prothrombin Time: 14.1 seconds (ref 11.4–15.2)

## 2020-05-12 LAB — TYPE AND SCREEN
ABO/RH(D): B NEG
Antibody Screen: NEGATIVE

## 2020-05-12 LAB — SARS CORONAVIRUS 2 BY RT PCR (HOSPITAL ORDER, PERFORMED IN ~~LOC~~ HOSPITAL LAB): SARS Coronavirus 2: NEGATIVE

## 2020-05-12 MED ORDER — ONDANSETRON HCL 4 MG/2ML IJ SOLN
INTRAMUSCULAR | Status: AC
  Filled 2020-05-12: qty 2

## 2020-05-12 MED ORDER — CEFAZOLIN SODIUM-DEXTROSE 1-4 GM/50ML-% IV SOLN
1.0000 g | Freq: Once | INTRAVENOUS | Status: AC
  Administered 2020-05-13: 1 g via INTRAVENOUS
  Filled 2020-05-12: qty 50

## 2020-05-12 MED ORDER — OXYCODONE-ACETAMINOPHEN 5-325 MG PO TABS
1.0000 | ORAL_TABLET | Freq: Once | ORAL | Status: AC
  Administered 2020-05-12: 1 via ORAL
  Filled 2020-05-12: qty 1

## 2020-05-12 MED ORDER — VITAMIN D 25 MCG (1000 UNIT) PO TABS
1000.0000 [IU] | ORAL_TABLET | Freq: Every day | ORAL | Status: DC
  Administered 2020-05-14 – 2020-05-19 (×6): 1000 [IU] via ORAL
  Filled 2020-05-12 (×6): qty 1

## 2020-05-12 MED ORDER — EZETIMIBE 10 MG PO TABS
10.0000 mg | ORAL_TABLET | Freq: Every day | ORAL | Status: DC
  Administered 2020-05-14 – 2020-05-19 (×6): 10 mg via ORAL
  Filled 2020-05-12 (×7): qty 1

## 2020-05-12 MED ORDER — ASPIRIN EC 81 MG PO TBEC
81.0000 mg | DELAYED_RELEASE_TABLET | Freq: Every day | ORAL | Status: DC
  Administered 2020-05-14 – 2020-05-19 (×6): 81 mg via ORAL
  Filled 2020-05-12 (×6): qty 1

## 2020-05-12 MED ORDER — FENTANYL CITRATE (PF) 100 MCG/2ML IJ SOLN
50.0000 ug | INTRAMUSCULAR | Status: AC | PRN
  Administered 2020-05-12 (×2): 50 ug via INTRAVENOUS
  Filled 2020-05-12 (×2): qty 2

## 2020-05-12 MED ORDER — INSULIN ASPART 100 UNIT/ML ~~LOC~~ SOLN
0.0000 [IU] | Freq: Three times a day (TID) | SUBCUTANEOUS | Status: DC
  Administered 2020-05-15: 2 [IU] via SUBCUTANEOUS
  Administered 2020-05-16: 1 [IU] via SUBCUTANEOUS
  Filled 2020-05-12 (×3): qty 1

## 2020-05-12 MED ORDER — DM-GUAIFENESIN ER 30-600 MG PO TB12: 1.0000 | ORAL_TABLET | Freq: Two times a day (BID) | ORAL | Status: DC | PRN

## 2020-05-12 MED ORDER — INSULIN ASPART 100 UNIT/ML ~~LOC~~ SOLN: 0.0000 [IU] | Freq: Every day | SUBCUTANEOUS | Status: DC

## 2020-05-12 MED ORDER — METHOCARBAMOL 500 MG PO TABS
500.0000 mg | ORAL_TABLET | Freq: Three times a day (TID) | ORAL | Status: DC | PRN
  Filled 2020-05-12: qty 1

## 2020-05-12 MED ORDER — FAMOTIDINE 20 MG PO TABS
20.0000 mg | ORAL_TABLET | Freq: Two times a day (BID) | ORAL | Status: DC
  Administered 2020-05-12 – 2020-05-17 (×10): 20 mg via ORAL
  Filled 2020-05-12 (×10): qty 1

## 2020-05-12 MED ORDER — OXYCODONE-ACETAMINOPHEN 5-325 MG PO TABS
1.0000 | ORAL_TABLET | ORAL | Status: DC | PRN
  Administered 2020-05-14 – 2020-05-16 (×6): 1 via ORAL
  Administered 2020-05-17: 0.5 via ORAL
  Administered 2020-05-17: 1 via ORAL
  Filled 2020-05-12 (×11): qty 1

## 2020-05-12 MED ORDER — PROPOFOL 10 MG/ML IV BOLUS
1.0000 mg/kg | Freq: Once | INTRAVENOUS | Status: AC
  Administered 2020-05-12: 67.1 mg via INTRAVENOUS
  Filled 2020-05-12: qty 20

## 2020-05-12 MED ORDER — MORPHINE SULFATE (PF) 4 MG/ML IV SOLN
4.0000 mg | INTRAVENOUS | Status: DC | PRN
  Filled 2020-05-12: qty 1

## 2020-05-12 MED ORDER — LORATADINE 10 MG PO TABS
10.0000 mg | ORAL_TABLET | Freq: Every day | ORAL | Status: DC
  Administered 2020-05-14 – 2020-05-19 (×6): 10 mg via ORAL
  Filled 2020-05-12 (×6): qty 1

## 2020-05-12 MED ORDER — MORPHINE SULFATE (PF) 2 MG/ML IV SOLN
1.0000 mg | INTRAVENOUS | Status: AC | PRN
  Administered 2020-05-12 – 2020-05-13 (×5): 1 mg via INTRAVENOUS
  Filled 2020-05-12 (×5): qty 1

## 2020-05-12 MED ORDER — ESCITALOPRAM OXALATE 10 MG PO TABS
5.0000 mg | ORAL_TABLET | Freq: Every day | ORAL | Status: DC
  Administered 2020-05-14 – 2020-05-19 (×6): 5 mg via ORAL
  Filled 2020-05-12 (×7): qty 0.5

## 2020-05-12 MED ORDER — ONDANSETRON HCL 4 MG/2ML IJ SOLN
4.0000 mg | Freq: Four times a day (QID) | INTRAMUSCULAR | Status: AC | PRN
  Administered 2020-05-12: 4 mg via INTRAVENOUS
  Filled 2020-05-12 (×2): qty 2

## 2020-05-12 MED ORDER — ALBUTEROL SULFATE (2.5 MG/3ML) 0.083% IN NEBU: 3.0000 mL | INHALATION_SOLUTION | RESPIRATORY_TRACT | Status: DC | PRN

## 2020-05-12 MED ORDER — SENNOSIDES-DOCUSATE SODIUM 8.6-50 MG PO TABS
1.0000 | ORAL_TABLET | Freq: Every evening | ORAL | Status: DC | PRN
  Administered 2020-05-16: 1 via ORAL
  Filled 2020-05-12: qty 1

## 2020-05-12 MED ORDER — ONDANSETRON 4 MG PO TBDP
8.0000 mg | ORAL_TABLET | Freq: Once | ORAL | Status: AC
  Administered 2020-05-12: 8 mg via ORAL
  Filled 2020-05-12: qty 2

## 2020-05-12 MED ORDER — ADULT MULTIVITAMIN W/MINERALS CH
1.0000 | ORAL_TABLET | Freq: Every day | ORAL | Status: DC
  Administered 2020-05-14 – 2020-05-19 (×6): 1 via ORAL
  Filled 2020-05-12 (×6): qty 1

## 2020-05-12 MED ORDER — ATORVASTATIN CALCIUM 20 MG PO TABS
80.0000 mg | ORAL_TABLET | Freq: Every day | ORAL | Status: DC
  Administered 2020-05-14 – 2020-05-19 (×6): 80 mg via ORAL
  Filled 2020-05-12 (×6): qty 4

## 2020-05-12 MED ORDER — ONDANSETRON HCL 4 MG/2ML IJ SOLN
4.0000 mg | Freq: Once | INTRAMUSCULAR | Status: AC
  Administered 2020-05-12: 4 mg via INTRAVENOUS
  Filled 2020-05-12: qty 2

## 2020-05-12 NOTE — ED Triage Notes (Signed)
Pt comes into the ED via EMS from home with c/o while getting clothes out of the dryer the pt stood and had a syncopal episode falling and injurying her left shoulder and left hip. Pt is a/x3.

## 2020-05-12 NOTE — ED Provider Notes (Signed)
[facility] Emergency Department Provider Note   ____________________________________________   First MD Initiated Contact with Patient 05/12/20 1319     (approximate)  I have reviewed the triage vital signs and the nursing notes.   HISTORY  Chief Complaint Fall    HPI Sarah Velasquez is a 74 y.o. female with a stated past medical history of anxiety, GERD, and hyperlipidemia who presents after a mechanical fall from standing resulting in left sided shoulder and hip pain. Patient does endorse minor head trauma without loss of consciousness.         Past Medical History:  Diagnosis Date  . Anxiety   . Chest pain 02/15-16/2009   Hospital [facility]  chest pain rule out //stress Myoview negative 10/21/2007//Stress cardiolite ;apical thinning ,negative ischemia (09/14/2003)  . Diabetes mellitus    typeII  . Diverticulosis of colon 09/2003   colonoscopy ext.and internal hemorrhoids (Dr. Vira Agar )12/03/2006// colonoscopy polyps multiple  benign; interal hemorrhoids ,divertics 09/16/2003  . Drug-induced constipation   . Environmental allergies   . GERD (gastroesophageal reflux disease)    EGD hiatal hernia; esophagitis/sigmoidoscopy WNL (Dr. Vira Agar) 03/04/1996: ABD. U/S  WNL (06/1996)//EGD multiple polyps ;small H.H (09/16/2003)  . H/O hiatal hernia   . Hyperlipidemia 05/1995  . Osteopenia 12/2011   spine -1.8, hip -0.7  . PONV (postoperative nausea and vomiting)   . Right lumbar radiculitis 2013   DDD, spondylosis, L3 radiculitis (MRI 07/2012) - ESI 09/2011 (Dr Sharlet Salina)   . Vertigo    hx of    Patient Active Problem List   Diagnosis Date Noted  . Chronic bronchitis ([place]) 07/26/2015  . Advanced care planning/counseling discussion 06/09/2014  . Caregiver burden 12/04/2013  . Medicare annual wellness visit, subsequent 09/21/2012  . Degenerative disc disease, lumbar 06/29/2012  . Osteopenia 12/04/2011  . Vitamin D deficiency 10/12/2009  . Mixed  hyperlipidemia 07/26/2007  . ALLERGIC RHINITIS 01/09/2007  . FIBROCYSTIC BREAST DISEASE 01/09/2007  . DISORDER, MENOPAUSAL NEC 01/09/2007  . Diabetes type 2, controlled ([place]) 01/07/2007  . GERD 01/07/2007  . ANXIETY DISORDER, GENERALIZED 07/18/2006    Past Surgical History:  Procedure Laterality Date  . ABDOMINAL HYSTERECTOMY  1986   ovaries intact Uterine prolapse  . COLONOSCOPY  02/2014   diverticulosis, int hem, rpt 5 yrs Vira Agar)  . COLONOSCOPY W/ POLYPECTOMY    . DEXA  12/2011   spine -1.8, hip -0.7  . ESI  09/2011   ([place], [place])  . HAND SURGERY  01/23/2002    Three fingers amputated.  Lawnmower   accident.  . LUMBAR LAMINECTOMY/DECOMPRESSION MICRODISCECTOMY Right 11/06/2012   LUMBAR LAMINECTOMY/DECOMPRESSION MICRODISCECTOMY 1 LEVEL;  Surgeon: Eustace Moore, MD;  Right lumbar three-four extraforaminal diskectomy  . NASAL SINUS SURGERY  1978  . TRIGGER FINGER RELEASE Left 04/20/2015   Procedure: LEFT THUMB TRIGGER FINGER RELEASE;  Surgeon: Milly Jakob, MD;  Location: [place];  Service: Orthopedics;  Laterality: Left;  . TUBAL LIGATION    . VAGINAL DELIVERY     NSVD x 2    Prior to Admission medications   Medication Sig Start Date End Date Taking? Authorizing Provider  albuterol (PROVENTIL HFA;VENTOLIN HFA) 108 (90 BASE) MCG/ACT inhaler Inhale 2 puffs into the lungs every 4 (four) hours as needed for wheezing or shortness of breath. Patient not taking: Reported on 04/19/2018 07/26/15   Flora Lipps, MD  aspirin 81 MG tablet Take 81 mg by mouth daily.    [provider]  atorvastatin (LIPITOR) 80 MG  tablet Take 1 tablet (80 mg total) by mouth daily. 09/14/15   Ria Bush, MD  cetirizine (ZYRTEC ALLERGY) 10 MG tablet Take 10 mg by mouth daily.      [provider]  cetirizine (ZYRTEC) 10 MG tablet Take 1 tablet (10 mg total) by mouth daily. 07/26/15   Flora Lipps, MD  Cholecalciferol (VITAMIN D3) 1000 UNITS CAPS Take 1 capsule by  mouth daily.    [provider]  docusate sodium (COLACE) 100 MG capsule Take 200 mg by mouth daily.     [provider]  escitalopram (LEXAPRO) 10 MG tablet Take 5 mg by mouth daily.    [provider]  Fluticasone Furoate-Vilanterol (BREO ELLIPTA) 100-25 MCG/INH AEPB Inhale 1 puff into the lungs daily. Patient not taking: Reported on 04/19/2018 07/26/15   Flora Lipps, MD  glucose blood (ONE TOUCH ULTRA TEST) test strip 1 each by Other route daily. Use to check sugar daily Dx: E11.9 **ONE TOUCH ULTRA BLUE** 10/22/14   Ria Bush, MD  metFORMIN (GLUCOPHAGE) 1000 MG tablet TAKE ONE TABLET BY MOUTH TWICE DAILY WITH A MEAL 09/28/15   Ria Bush, MD  Multiple Vitamin (MULTIVITAMIN) tablet Take 1 tablet by mouth daily.    [provider]  omeprazole (PRILOSEC OTC) 20 MG tablet Take 20 mg by mouth daily.    [provider]  Jonetta Speak LANCETS 99B MISC USE ONE LANCET ONCE DAILY AS DIRECTED 11/02/14   Ria Bush, MD  predniSONE (DELTASONE) 10 MG tablet Take 3 tablets daily for 3 days, then 2 tablets daily for 3 days, then 1 tablet daily for 3 days. 07/12/15   Pleas Koch, NP  ranitidine (ZANTAC) 150 MG tablet Take 150 mg by mouth 2 (two) times daily.     [provider]  traMADol (ULTRAM) 50 MG tablet Take 1 tablet (50 mg total) by mouth every 12 (twelve) hours as needed (cough). 07/12/15   Pleas Koch, NP  Umeclidinium Bromide (INCRUSE ELLIPTA) 62.5 MCG/INH AEPB Inhale 1 puff into the lungs daily. Patient not taking: Reported on 04/19/2018 07/26/15   Flora Lipps, MD  ZETIA 10 MG tablet TAKE ONE TABLET BY MOUTH EVERY DAY 07/12/15   Ria Bush, MD    Allergies Shellfish allergy, Azithromycin, Meperidine hcl, Neurontin [gabapentin], and Omeprazole  Family History  Problem Relation Age of Onset  . Heart disease Mother 57       MI  . COPD Father        emphysema  . Cancer Father        esophageal and brain  cancer  . Heart disease Father 101       CABG  . Alcohol abuse Sister 64       etoh  . Heart disease Brother 78       MI  . Breast cancer Neg Hx     Social History Social History   Tobacco Use  . Smoking status: Never Smoker  . Smokeless tobacco: Never Used  Substance Use Topics  . Alcohol use: Yes    Alcohol/week: 0.0 standard drinks    Comment: rarely - wine  . Drug use: No    Review of Systems Constitutional: No fever/chills Eyes: No visual changes. ENT: No sore throat. Cardiovascular: Denies chest pain. Respiratory: Denies shortness of breath. Gastrointestinal: No abdominal pain.  No nausea, no vomiting.  No diarrhea. Genitourinary: Negative for dysuria. Musculoskeletal: Positive for left shoulder and left hip pain Skin: Negative for rash. Neurological: Negative for headaches,  weakness/numbness/paresthesias in any extremity Psychiatric: Negative for suicidal ideation/homicidal ideation   ____________________________________________   PHYSICAL EXAM:  VITAL SIGNS: ED Triage Vitals  Enc Vitals Group     BP 05/12/20 1159 (!) 110/56     Pulse Rate 05/12/20 1159 79     Resp 05/12/20 1159 (!) 22     Temp 05/12/20 1159 97.6 F (36.4 C)     Temp Source 05/12/20 1159 Oral     SpO2 05/12/20 1159 99 %     Weight 05/12/20 1201 148 lb (67.1 kg)     Height 05/12/20 1201 5\' 5"  (1.651 m)     Head Circumference --      Peak Flow --      Pain Score 05/12/20 1206 10     Pain Loc --      Pain Edu? --      Excl. in [place]? --    Constitutional: Alert and oriented. Well appearing and in no acute distress. Eyes: Conjunctivae are normal. PERRL. EOMI. Head: Atraumatic. Nose: No congestion/rhinnorhea. Mouth/Throat: Mucous membranes are moist.  Oropharynx non-erythematous. Neck: No stridor.   Cardiovascular: Normal rate, regular rhythm. Grossly normal heart sounds.  Good peripheral circulation. Respiratory: Normal respiratory effort.  No retractions. Lungs  CTAB. Gastrointestinal: Soft and nontender. No distention. No abdominal bruits. No CVA tenderness. Musculoskeletal: Deformity of the left shoulder and tenderness to palpation, tenderness to palpation over left hip with limited range of motion secondary to pain Neurologic:  Normal speech and language. No gross focal neurologic deficits are appreciated. No gait instability. Skin:  Skin is warm, dry and intact. No rash noted. Psychiatric: Mood and affect are normal. Speech and behavior are normal.  ____________________________________________   LABS (all labs ordered are listed, but only abnormal results are displayed)  Labs Reviewed - No data to display ____________________________________________  EKG ____________________________________________  RADIOLOGY  ED MD interpretation: X-ray of the left shoulder shows acute dislocation with avulsion fracture  X-ray of the left hip shows a left intertrochanteric fracture  Official radiology report(s): DG Chest 1 View  Result Date: 05/12/2020 CLINICAL DATA:  Syncopal episode. Fell and injured left shoulder and left hip. EXAM: CHEST  1 VIEW COMPARISON:  Chest x-ray 07/12/2015 FINDINGS: The cardiac silhouette, mediastinal and hilar contours are within normal limits and stable. The lungs are clear of an acute process. No pleural effusion or pneumothorax. Fracture dislocation noted involving the left shoulder. The right shoulder is intact. No definite rib fractures. IMPRESSION: 1. No acute cardiopulmonary findings. 2. Left shoulder fracture dislocation. Electronically Signed   By: Marijo Sanes M.D.   On: 05/12/2020 12:57   CT HEAD WO CONTRAST  Result Date: 05/12/2020 CLINICAL DATA:  Syncope with fall EXAM: CT HEAD WITHOUT CONTRAST TECHNIQUE: Contiguous axial images were obtained from the base of the skull through the vertex without intravenous contrast. COMPARISON:  None. FINDINGS: Brain: There is age related volume loss. There is no intracranial  mass, hemorrhage, extra-axial fluid collection, or midline shift. There is slight small vessel disease in the centra semiovale bilaterally. No acute infarct evident. Vascular: No hyperdense vessel. There is mild calcification in the carotid siphon regions bilaterally. Skull: Bony calvarium appears intact. Sinuses/Orbits: There is mucosal thickening in several ethmoid air cells. Other paranasal sinuses are clear. Orbits appear symmetric bilaterally. Other: Mastoid air cells are clear. IMPRESSION: Age related volume loss with slight periventricular small vessel disease. No acute infarct. No mass or hemorrhage. Foci of arterial vascular calcification noted. Mucosal thickening noted in several ethmoid  air cells. Electronically Signed   By: Lowella Grip III M.D.   On: 05/12/2020 14:02   DG Shoulder Left  Result Date: 05/12/2020 CLINICAL DATA:  Fall. EXAM: LEFT SHOULDER - 2+ VIEW COMPARISON:  None. FINDINGS: Anterior dislocation of the humeral head with displaced posterior greater tuberosity fracture fragment. Mild degenerative changes of the acromioclavicular joint. Soft tissues are unremarkable. IMPRESSION: 1. Anterior shoulder dislocation with displaced posterior greater tuberosity fracture fragment. Electronically Signed   By: Titus Dubin M.D.   On: 05/12/2020 12:57   DG Hip Unilat W or Wo Pelvis 2-3 Views Left  Result Date: 05/12/2020 CLINICAL DATA:  Left hip in a fall secondary pain due to an injury suffered to syncope today. Initial encounter. EXAM: DG HIP (WITH OR WITHOUT PELVIS) 2-3V LEFT COMPARISON:  None. FINDINGS: The patient has an acute left intertrochanteric fracture. The lesser trochanter is a separate fragment. No other acute abnormality is identified. Lower lumbar spondylosis noted. IMPRESSION: Acute left intertrochanteric fracture. Electronically Signed   By: Inge Rise M.D.   On: 05/12/2020 12:57    ____________________________________________   PROCEDURES  Procedure(s)  performed (including Critical Care):  .Sedation  Date/Time: 05/12/2020 4:17 PM Performed by: Naaman Plummer, MD Authorized by: Naaman Plummer, MD   Consent:    Consent obtained:  Verbal and emergent situation   Consent given by:  Patient   Risks discussed:  Allergic reaction, dysrhythmia, inadequate sedation, nausea, prolonged hypoxia resulting in organ damage, prolonged sedation necessitating reversal, respiratory compromise necessitating ventilatory assistance and intubation and vomiting   Alternatives discussed:  Analgesia without sedation, anxiolysis and regional anesthesia Universal protocol:    Procedure explained and questions answered to patient or proxy's satisfaction: yes     Relevant documents present and verified: yes     Test results available and properly labeled: yes     Imaging studies available: yes     Required blood products, implants, devices, and special equipment available: yes     Site/side marked: yes     Immediately prior to procedure a time out was called: yes     Patient identity confirmation method:  Verbally with patient Indications:    Procedure performed:  Fracture reduction   Procedure necessitating sedation performed by:  Physician performing sedation Pre-sedation assessment:    Time since last food or drink:  6 hrs   ASA classification: class 1 - normal, healthy patient     Neck mobility: normal     Mouth opening:  3 or more finger widths   Thyromental distance:  4 finger widths   Mallampati score:  I - soft palate, uvula, fauces, pillars visible   Pre-sedation assessments completed and reviewed: airway patency, cardiovascular function, hydration status, mental status, nausea/vomiting, pain level, respiratory function and temperature   Immediate pre-procedure details:    Reassessment: Patient reassessed immediately prior to procedure     Reviewed: vital signs, relevant labs/tests and NPO status     Verified: bag valve mask available, emergency  equipment available, intubation equipment available, IV patency confirmed, oxygen available and suction available   Procedure details (see MAR for exact dosages):    Preoxygenation:  Nasal cannula   Sedation:  Propofol   Intended level of sedation: deep   Intra-procedure monitoring:  Blood pressure monitoring, cardiac monitor, continuous pulse oximetry, frequent LOC assessments, frequent vital sign checks and continuous capnometry   Intra-procedure events: none     Total Provider sedation time (minutes):  21 Post-procedure details:    Attendance:  Constant attendance by certified staff until patient recovered     Recovery: Patient returned to pre-procedure baseline     Post-sedation assessments completed and reviewed: airway patency, cardiovascular function, hydration status, mental status, nausea/vomiting, pain level, respiratory function and temperature     Patient is stable for discharge or admission: yes     Patient tolerance:  Tolerated well, no immediate complications .Ortho Injury Treatment  Date/Time: 05/12/2020 4:18 PM Performed by: Naaman Plummer, MD Authorized by: Naaman Plummer, MD   Consent:    Consent obtained:  Emergent situation   Consent given by:  Patient   Risks discussed:  Fracture, irreducible dislocation, recurrent dislocation, nerve damage, restricted joint movement, stiffness and vascular damage   Alternatives discussed:  No treatment, delayed treatment, alternative treatment, immobilization and referralInjury location: shoulder Location details: left shoulder Injury type: fracture-dislocation Dislocation type: anterior Fracture type: greater humeral tuberosity Pre-procedure neurovascular assessment: neurovascularly intact Pre-procedure distal perfusion: normal Pre-procedure neurological function: normal Pre-procedure range of motion: reduced  Anesthesia: Local anesthesia used: no  Patient sedated: Yes. Refer to sedation procedure documentation for details  of sedation. Manipulation performed: yes Skin traction used: no Skeletal traction used: yes Reduction successful: yes X-ray confirmed reduction: yes Immobilization: sling Post-procedure neurovascular assessment: post-procedure neurovascularly intact Post-procedure distal perfusion: normal Post-procedure neurological function: normal Post-procedure range of motion: normal      ____________________________________________   INITIAL IMPRESSION / ASSESSMENT AND PLAN / ED COURSE     Patient presents after mechanical fall from standing onto her left side resulting in left-sided head trauma, left shoulder and left hip pain  Work-up: X-ray left shoulder, left hip CT head Findings: Left shoulder avulsion fracture and anterior dislocation, left intertrochanteric fracture  Patient does not currently demonstrate compensations of dislocation/fracture such as compartment syndrome, arterial injury, or nerve injury  The dislocation has been satisfactorily reduced and immobilized at bedside, the patient has been given appropriate analgesia with morphine and antiemetics with Zofran as needed  I spoke to Dr. Rudene Christians in orthopedic surgery who agrees to consult on this patient as an inpatient  Spoke to the hospitalist on-call who agrees to sepsis patient onto his service for further management.  Dispo: Admit      ____________________________________________   FINAL CLINICAL IMPRESSION(S) / ED DIAGNOSES  Final diagnoses:  Hip pain     ED Discharge Orders    None       Note:  This document was prepared using Dragon voice recognition software and may include unintentional dictation errors.   Naaman Plummer, MD 05/12/20 [phone]

## 2020-05-12 NOTE — ED Notes (Signed)
Per RN Marya Amsler, charge this RN to call report to assigned room 137. This RN provided hand off report charge [employer].

## 2020-05-12 NOTE — ED Notes (Signed)
This RN walkd COVID swab to lab and given to lab   RN charge RN, made aware.

## 2020-05-12 NOTE — ED Triage Notes (Signed)
Pt presents from home via [facility] with c/o fall at home. Pt states "I feel like my shoulder and hip are broke". Pt c/o pain to left shoulder and left hip. Pt diaphoretic in triage and moaning in pain.

## 2020-05-12 NOTE — Consult Note (Signed)
Reason for Consult: Left intertrochanteric hip fracture, left shoulder fracture dislocation Referring Physician: ER  Sarah Velasquez is an 74 y.o. female.  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  Her son reports that she has been recently diagnosed with some early dementia.  She reports that she tripped and fell injuring her left side.  Injection severe pain.  She was brought to the emergency room where she was found to have a fracture dislocation of the left shoulder as well as left intertrochanteric hip fracture.  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  This may affect postop rehab  Past Medical History:  Diagnosis Date  . Anxiety   . Chest pain 02/15-16/2009   Hospital [facility]  chest pain rule out //stress Myoview negative 10/21/2007//Stress cardiolite ;apical thinning ,negative ischemia (09/14/2003)  . Diabetes mellitus    typeII  . Diverticulosis of colon 09/2003   colonoscopy ext.and internal hemorrhoids (Dr. Vira Agar )12/03/2006// colonoscopy polyps multiple  benign; interal hemorrhoids ,divertics 09/16/2003  . Drug-induced constipation   . Environmental allergies   . GERD (gastroesophageal reflux disease)    EGD hiatal hernia; esophagitis/sigmoidoscopy WNL (Dr. Vira Agar) 03/04/1996: ABD. U/S  WNL (06/1996)//EGD multiple polyps ;small H.H (09/16/2003)  . H/O hiatal hernia   . Hyperlipidemia 05/1995  . Osteopenia 12/2011   spine -1.8, hip -0.7  . PONV (postoperative nausea and vomiting)   . Right lumbar radiculitis 2013   DDD, spondylosis, L3 radiculitis (MRI 07/2012) - ESI 09/2011 (Dr Sharlet Salina)   . Vertigo    hx of    Past Surgical History:  Procedure Laterality Date  . ABDOMINAL HYSTERECTOMY  1986   ovaries intact Uterine prolapse  . COLONOSCOPY  02/2014   diverticulosis, int hem, rpt 5 yrs Vira Agar)  . COLONOSCOPY W/ POLYPECTOMY    . DEXA  12/2011   spine  -1.8, hip -0.7  . ESI  09/2011   ([place], [place])  . HAND SURGERY  01/23/2002    Three fingers amputated.  Lawnmower   accident.  . LUMBAR LAMINECTOMY/DECOMPRESSION MICRODISCECTOMY Right 11/06/2012   LUMBAR LAMINECTOMY/DECOMPRESSION MICRODISCECTOMY 1 LEVEL;  Surgeon: Eustace Moore, MD;  Right lumbar three-four extraforaminal diskectomy  . NASAL SINUS SURGERY  1978  . TRIGGER FINGER RELEASE Left 04/20/2015   Procedure: LEFT THUMB TRIGGER FINGER RELEASE;  Surgeon: Milly Jakob, MD;  Location: [facility];  Service: Orthopedics;  Laterality: Left;  . TUBAL LIGATION    . VAGINAL DELIVERY     NSVD x 2    Family History  Problem Relation Age of Onset  . Heart disease Mother 40       MI  . COPD Father        emphysema  . Cancer Father        esophageal and brain cancer  . Heart disease Father 54       CABG  . Alcohol abuse Sister 80       etoh  . Heart disease Brother 25       MI  . Breast cancer Neg Hx     Social History:  reports that she has never smoked. She has never used smokeless tobacco. She reports current alcohol use. She reports that she does not use drugs.  Allergies:  Allergies  Allergen Reactions  . Shellfish Allergy Anaphylaxis  . Azithromycin     REACTION: rash  . Meperidine Hcl  REACTION: nausea  . Neurontin [Gabapentin] Other (See Comments)    Incoherent  . Omeprazole     REACTION: rash ([facility] brand)    Medications: I have reviewed the patient's current medications.  No results found for this or any previous visit (from the past 48 hour(s)).  DG Chest 1 View  Result Date: 05/12/2020 CLINICAL DATA:  Syncopal episode. Fell and injured left shoulder and left hip. EXAM: CHEST  1 VIEW COMPARISON:  Chest x-ray 07/12/2015 FINDINGS: The cardiac silhouette, mediastinal and hilar contours are within normal limits and stable. The lungs are clear of an acute process. No pleural effusion or pneumothorax. Fracture dislocation noted involving the left  shoulder. The right shoulder is intact. No definite rib fractures. IMPRESSION: 1. No acute cardiopulmonary findings. 2. Left shoulder fracture dislocation. Electronically Signed   By: Marijo Sanes M.D.   On: 05/12/2020 12:57   CT HEAD WO CONTRAST  Result Date: 05/12/2020 CLINICAL DATA:  Syncope with fall EXAM: CT HEAD WITHOUT CONTRAST TECHNIQUE: Contiguous axial images were obtained from the base of the skull through the vertex without intravenous contrast. COMPARISON:  None. FINDINGS: Brain: There is age related volume loss. There is no intracranial mass, hemorrhage, extra-axial fluid collection, or midline shift. There is slight small vessel disease in the centra semiovale bilaterally. No acute infarct evident. Vascular: No hyperdense vessel. There is mild calcification in the carotid siphon regions bilaterally. Skull: Bony calvarium appears intact. Sinuses/Orbits: There is mucosal thickening in several ethmoid air cells. Other paranasal sinuses are clear. Orbits appear symmetric bilaterally. Other: Mastoid air cells are clear. IMPRESSION: Age related volume loss with slight periventricular small vessel disease. No acute infarct. No mass or hemorrhage. Foci of arterial vascular calcification noted. Mucosal thickening noted in several ethmoid air cells. Electronically Signed   By: Lowella Grip III M.D.   On: 05/12/2020 14:02   DG Shoulder Left  Result Date: 05/12/2020 CLINICAL DATA:  Fall. EXAM: LEFT SHOULDER - 2+ VIEW COMPARISON:  None. FINDINGS: Anterior dislocation of the humeral head with displaced posterior greater tuberosity fracture fragment. Mild degenerative changes of the acromioclavicular joint. Soft tissues are unremarkable. IMPRESSION: 1. Anterior shoulder dislocation with displaced posterior greater tuberosity fracture fragment. Electronically Signed   By: Titus Dubin M.D.   On: 05/12/2020 12:57   DG Hip Unilat W or Wo Pelvis 2-3 Views Left  Result Date: 05/12/2020 CLINICAL DATA:   Left hip in a fall secondary pain due to an injury suffered to syncope today. Initial encounter. EXAM: DG HIP (WITH OR WITHOUT PELVIS) 2-3V LEFT COMPARISON:  None. FINDINGS: The patient has an acute left intertrochanteric fracture. The lesser trochanter is a separate fragment. No other acute abnormality is identified. Lower lumbar spondylosis noted. IMPRESSION: Acute left intertrochanteric fracture. Electronically Signed   By: Inge Rise M.D.   On: 05/12/2020 12:57    Review of Systems Blood pressure (!) 95/55, pulse 63, temperature 97.6 F (36.4 C), temperature source Oral, resp. rate 20, height 5\' 5"  (1.651 m), weight 67.1 kg, SpO2 96 %. Physical Exam Left leg is slightly externally rotated with palpable pulses.  Skin is intact around the left hip.  She is able to flex extend the toes.  Left shoulder has anterior deformity and after reduction it.  Had normal appearance. Assessment/Plan: 1.  Left intertrochanteric hip fracture, plan is for ORIF with intramedullary device.  Discussed that in detail with her son in particular potential risks of DVT/PE and infection. 2.  Left shoulder fracture dislocation.  Discussed  that there is been reduced and the fracture should heal although there may be a rotator cuff problem that can be addressed down the road if it is problematic. 3.  Confusion.  I did discuss again with the son that her confusion is likely to get worse here in the hospital and she will probably require rehab stay.  He will get with a social worker/discharge planners regarding placement as her husband and is much more confused than she is and they have been managing to stay at home with some difficulty.  Hessie Knows 05/12/2020, 4:11 PM

## 2020-05-12 NOTE — ED Notes (Signed)
Pt to CT

## 2020-05-12 NOTE — H&P (Signed)
History and Physical    Sarah Velasquez DJM:[id number] DOB: 07-31-1946 DOA: 05/12/2020  Referring MD/NP/PA:   PCP: Dion Body, MD   Patient coming from:  The patient is coming from home.  At baseline, pt is independent for most of ADL.        Chief Complaint: fall and pain in left hip and shoulder  HPI: Sarah Velasquez is a 74 y.o. female with medical history significant of hypertension, diabetes mellitus, GERD, depression, anxiety, vertigo, chronic bronchitis, who presents with fall and pain in left hip and shoulder.  Per her son, patient accidentally fell in the laundry room at home at about 10 AM. No LOC. She fell on the left side, and injured her left hip and left shoulder, causing severe pain in the left hip and shoulder.  The pain is constant, sharp, severe, nonradiating.  No arm or leg numbness.  Patient denies chest pain, cough, shortness breath, fever or chills.  Patient had nausea and vomited earlier, which has resolved.  Currently patient does not have nausea, vomiting, diarrhea or abdominal pain.  No symptoms of UTI. 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    ED Course: pt was found to have pending CBC, BMP, temperature normal, blood pressure 95/55, heart rate 63, RR 22, oxygen saturation 96% on 3 L oxygen, CT head is negative for acute intracranial abnormalities.  X-ray showed left intertrochanteric hip fracture and left shoulder fracture dislocation.  Patient is admitted to [place] bed as inpatient.  Orthopedic surgeon, Dr. Rudene Christians is consulted.    Review of Systems:   General: no fevers, chills, no body weight gain, has fatigue HEENT: no blurry vision, hearing changes or sore throat Respiratory: no dyspnea, coughing, wheezing CV: no chest pain, no palpitations GI: had nausea, vomiting, no abdominal pain, diarrhea, constipation GU: no dysuria, burning on urination, increased urinary frequency, hematuria  Ext: no leg  edema Neuro: no unilateral weakness, numbness, or tingling, no vision change or hearing loss. Has fall. Skin: no rash, no skin tear. MSK: has pain left hip and left shoulder Heme: No easy bruising.  Travel history: No recent long distant travel.  Allergy:  Allergies  Allergen Reactions   Shellfish Allergy Anaphylaxis   Azithromycin     REACTION: rash   Meperidine Hcl     REACTION: nausea   Neurontin [Gabapentin] Other (See Comments)    Incoherent   Omeprazole     REACTION: rash ([facility] brand)    Past Medical History:  Diagnosis Date   Anxiety    Chest pain 02/15-16/2009   [facility]  chest pain rule out //stress Myoview negative 10/21/2007//Stress cardiolite ;apical thinning ,negative ischemia (09/14/2003)   Diabetes mellitus    typeII   Diverticulosis of colon 09/2003   colonoscopy ext.and internal hemorrhoids (Dr. Vira Agar )12/03/2006// colonoscopy polyps multiple  benign; interal hemorrhoids ,divertics 09/16/2003   Drug-induced constipation    Environmental allergies    GERD (gastroesophageal reflux disease)    EGD hiatal hernia; esophagitis/sigmoidoscopy WNL (Dr. Vira Agar) 03/04/1996: ABD. U/S  WNL (06/1996)//EGD multiple polyps ;small H.H (09/16/2003)   H/O hiatal hernia    Hyperlipidemia 05/1995   Osteopenia 12/2011   spine -1.8, hip -0.7   PONV (postoperative nausea and vomiting)    Right lumbar radiculitis 2013   DDD, spondylosis, L3 radiculitis (MRI 07/2012) - ESI 09/2011 (Dr Sharlet Salina)    Vertigo    hx of    Past Surgical  History:  Procedure Laterality Date   ABDOMINAL HYSTERECTOMY  1986   ovaries intact Uterine prolapse   COLONOSCOPY  02/2014   diverticulosis, int hem, rpt 5 yrs Vira Agar)   COLONOSCOPY W/ POLYPECTOMY     DEXA  12/2011   spine -1.8, hip -0.7   ESI  09/2011   ([place])   HAND SURGERY  01/23/2002    Three fingers amputated.  Lawnmower   accident.   LUMBAR LAMINECTOMY/DECOMPRESSION MICRODISCECTOMY Right  11/06/2012   LUMBAR LAMINECTOMY/DECOMPRESSION MICRODISCECTOMY 1 LEVEL;  Surgeon: Eustace Moore, MD;  Right lumbar three-four extraforaminal diskectomy   NASAL SINUS SURGERY  1978   TRIGGER FINGER RELEASE Left 04/20/2015   Procedure: LEFT THUMB TRIGGER FINGER RELEASE;  Surgeon: Milly Jakob, MD;  Location: [facility];  Service: Orthopedics;  Laterality: Left;   TUBAL LIGATION     VAGINAL DELIVERY     NSVD x 2    Social History:  reports that she has never smoked. She has never used smokeless tobacco. She reports current alcohol use. She reports that she does not use drugs.  Family History:  Family History  Problem Relation Age of Onset   Heart disease Mother 27       MI   COPD Father        emphysema   Cancer Father        esophageal and brain cancer   Heart disease Father 69       CABG   Alcohol abuse Sister 96       etoh   Heart disease Brother 22       MI   Breast cancer Neg Hx      Prior to Admission medications   Medication Sig Start Date End Date Taking? Authorizing Provider  albuterol (PROVENTIL HFA;VENTOLIN HFA) 108 (90 BASE) MCG/ACT inhaler Inhale 2 puffs into the lungs every 4 (four) hours as needed for wheezing or shortness of breath. Patient not taking: Reported on 04/19/2018 07/26/15   Flora Lipps, MD  aspirin 81 MG tablet Take 81 mg by mouth daily.    [provider]  atorvastatin (LIPITOR) 80 MG tablet Take 1 tablet (80 mg total) by mouth daily. 09/14/15   Ria Bush, MD  cetirizine (ZYRTEC ALLERGY) 10 MG tablet Take 10 mg by mouth daily.      [provider]  cetirizine (ZYRTEC) 10 MG tablet Take 1 tablet (10 mg total) by mouth daily. 07/26/15   Flora Lipps, MD  Cholecalciferol (VITAMIN D3) 1000 UNITS CAPS Take 1 capsule by mouth daily.    [provider]  docusate sodium (COLACE) 100 MG capsule Take 200 mg by mouth daily.     [provider]  escitalopram (LEXAPRO) 10 MG tablet Take 5 mg by  mouth daily.    [provider]  Fluticasone Furoate-Vilanterol (BREO ELLIPTA) 100-25 MCG/INH AEPB Inhale 1 puff into the lungs daily. Patient not taking: Reported on 04/19/2018 07/26/15   Flora Lipps, MD  glucose blood (ONE TOUCH ULTRA TEST) test strip 1 each by Other route daily. Use to check sugar daily Dx: E11.9 **ONE TOUCH ULTRA BLUE** 10/22/14   Ria Bush, MD  metFORMIN (GLUCOPHAGE) 1000 MG tablet TAKE ONE TABLET BY MOUTH TWICE DAILY WITH A MEAL 09/28/15   Ria Bush, MD  Multiple Vitamin (MULTIVITAMIN) tablet Take 1 tablet by mouth daily.    [provider]  omeprazole (PRILOSEC OTC) 20 MG tablet Take 20 mg by mouth daily.    [provider]  [facility] DELICA LANCETS 08M MISC USE ONE LANCET ONCE DAILY AS DIRECTED 11/02/14   Ria Bush, MD  predniSONE (DELTASONE) 10 MG tablet Take 3 tablets daily for 3 days, then 2 tablets daily for 3 days, then 1 tablet daily for 3 days. 07/12/15   Pleas Koch, NP  ranitidine (ZANTAC) 150 MG tablet Take 150 mg by mouth 2 (two) times daily.     [provider]  traMADol (ULTRAM) 50 MG tablet Take 1 tablet (50 mg total) by mouth every 12 (twelve) hours as needed (cough). 07/12/15   Pleas Koch, NP  Umeclidinium Bromide (INCRUSE ELLIPTA) 62.5 MCG/INH AEPB Inhale 1 puff into the lungs daily. Patient not taking: Reported on 04/19/2018 07/26/15   Flora Lipps, MD  ZETIA 10 MG tablet TAKE ONE TABLET BY MOUTH EVERY DAY 07/12/15   Ria Bush, MD    Physical Exam: Vitals:   05/12/20 1630 05/12/20 1645 05/12/20 1745 05/12/20 1847  BP: 108/86 101/67  (!) 93/57  Pulse: 70 65 77 72  Resp: 18 (!) 21 (!) 23 18  Temp:    98.5 F (36.9 C)  TempSrc:    Oral  SpO2: 100% 100% 96% 95%  Weight:      Height:       General: Not in acute distress HEENT:       Eyes: PERRL, EOMI, no scleral icterus.       ENT: No discharge from the ears and nose, no pharynx injection, no tonsillar enlargement.         Neck: No JVD, no bruit, no mass felt. Heme: No neck lymph node enlargement. Cardiac: S1/S2, RRR, No murmurs, No gallops or rubs. Respiratory: No rales, wheezing, rhonchi or rubs. GI: Soft, nondistended, nontender, no rebound pain, no organomegaly, BS present. GU: No hematuria Ext: No pitting leg edema bilaterally. 2+DP/PT pulse bilaterally. S/p of amputation of 3-5th fingers in right hand. Musculoskeletal: has tenderness in the left hip and left shoulder Skin: No rashes.  Neuro: Alert, oriented X3, cranial nerves II-XII grossly intact, moves all extremities. Psych: Patient is not psychotic, no suicidal or hemocidal ideation.  Labs on Admission: I have personally reviewed following labs and imaging studies  CBC: No results for input(s): WBC, NEUTROABS, HGB, HCT, MCV, PLT in the last 168 hours. Basic Metabolic Panel: No results for input(s): NA, K, CL, CO2, GLUCOSE, BUN, CREATININE, CALCIUM, MG, PHOS in the last 168 hours. GFR: CrCl cannot be calculated (Patient's most recent lab result is older than the maximum 21 days allowed.). Liver Function Tests: No results for input(s): AST, ALT, ALKPHOS, BILITOT, PROT, ALBUMIN in the last 168 hours. No results for input(s): LIPASE, AMYLASE in the last 168 hours. No results for input(s): AMMONIA in the last 168 hours. Coagulation Profile: No results for input(s): INR, PROTIME in the last 168 hours. Cardiac Enzymes: No results for input(s): CKTOTAL, CKMB, CKMBINDEX, TROPONINI in the last 168 hours. BNP (last 3 results) No results for input(s): PROBNP in the last 8760 hours. HbA1C: No results for input(s): HGBA1C in the last 72 hours. CBG: No results for input(s): GLUCAP in the last 168 hours. Lipid Profile: No results for input(s): CHOL, HDL, LDLCALC, TRIG, CHOLHDL, LDLDIRECT in the last 72 hours. Thyroid Function Tests: No results for input(s): TSH, T4TOTAL, FREET4, T3FREE, THYROIDAB in the last 72 hours. Anemia Panel: No results for  input(s): VITAMINB12, FOLATE, FERRITIN, TIBC, IRON, RETICCTPCT in the last 72 hours. Urine analysis:    Component Value Date/Time  COLORURINE STRAW (A) 03/10/2015 1955   APPEARANCEUR CLEAR (A) 03/10/2015 1955   LABSPEC 1.001 (L) 03/10/2015 1955   PHURINE 7.0 03/10/2015 1955   GLUCOSEU NEGATIVE 03/10/2015 1955   HGBUR 3+ (A) 03/10/2015 1955   BILIRUBINUR neg 03/19/2015 [place] 03/10/2015 1955   PROTEINUR neg 03/19/2015 1357   PROTEINUR NEGATIVE 03/10/2015 1955   UROBILINOGEN negative 03/19/2015 1357   NITRITE neg 03/19/2015 1357   NITRITE NEGATIVE 03/10/2015 1955   LEUKOCYTESUR large (3+) (A) 03/19/2015 1357   Sepsis Labs: @LABRCNTIP (procalcitonin:4,lacticidven:4) ) Recent Results (from the past 240 hour(s))  SARS Coronavirus 2 by RT PCR (hospital order, performed in [place] hospital lab) Nasopharyngeal Nasopharyngeal Swab     Status: None   Collection Time: 05/12/20  3:58 PM   Specimen: Nasopharyngeal Swab  Result Value Ref Range Status   SARS Coronavirus 2 NEGATIVE NEGATIVE Final    Comment: (NOTE) SARS-CoV-2 target nucleic acids are NOT DETECTED.  The SARS-CoV-2 RNA is generally detectable in upper and lower respiratory specimens during the acute phase of infection. The lowest concentration of SARS-CoV-2 viral copies this assay can detect is 250 copies / mL. A negative result does not preclude SARS-CoV-2 infection and should not be used as the sole basis for treatment or other patient management decisions.  A negative result may occur with improper specimen collection / handling, submission of specimen other than nasopharyngeal swab, presence of viral mutation(s) within the areas targeted by this assay, and inadequate number of viral copies (<250 copies / mL). A negative result must be combined with clinical observations, patient history, and epidemiological information.  Fact Sheet for Patients:   StrictlyIdeas.no  Fact  Sheet for Healthcare Providers: BankingDealers.co.za  This test is not yet approved or  cleared by the [place] FDA and has been authorized for detection and/or diagnosis of SARS-CoV-2 by FDA under an Emergency Use Authorization (EUA).  This EUA will remain in effect (meaning this test can be used) for the duration of the COVID-19 declaration under Section 564(b)(1) of the Act, 21 U.S.C. section 360bbb-3(b)(1), unless the authorization is terminated or revoked sooner.  Performed at [facility], [address], [place], [place] [address]      Radiological Exams on Admission: DG Chest 1 View  Result Date: 05/12/2020 CLINICAL DATA:  Syncopal episode. Fell and injured left shoulder and left hip. EXAM: CHEST  1 VIEW COMPARISON:  Chest x-ray 07/12/2015 FINDINGS: The cardiac silhouette, mediastinal and hilar contours are within normal limits and stable. The lungs are clear of an acute process. No pleural effusion or pneumothorax. Fracture dislocation noted involving the left shoulder. The right shoulder is intact. No definite rib fractures. IMPRESSION: 1. No acute cardiopulmonary findings. 2. Left shoulder fracture dislocation. Electronically Signed   By: Marijo Sanes M.D.   On: 05/12/2020 12:57   CT HEAD WO CONTRAST  Result Date: 05/12/2020 CLINICAL DATA:  Syncope with fall EXAM: CT HEAD WITHOUT CONTRAST TECHNIQUE: Contiguous axial images were obtained from the base of the skull through the vertex without intravenous contrast. COMPARISON:  None. FINDINGS: Brain: There is age related volume loss. There is no intracranial mass, hemorrhage, extra-axial fluid collection, or midline shift. There is slight small vessel disease in the centra semiovale bilaterally. No acute infarct evident. Vascular: No hyperdense vessel. There is mild calcification in the carotid siphon regions bilaterally. Skull: Bony calvarium appears intact. Sinuses/Orbits: There is mucosal thickening  in several ethmoid air cells. Other paranasal sinuses are clear. Orbits appear symmetric  bilaterally. Other: Mastoid air cells are clear. IMPRESSION: Age related volume loss with slight periventricular small vessel disease. No acute infarct. No mass or hemorrhage. Foci of arterial vascular calcification noted. Mucosal thickening noted in several ethmoid air cells. Electronically Signed   By: Lowella Grip III M.D.   On: 05/12/2020 14:02   DG Shoulder Left  Result Date: 05/12/2020 CLINICAL DATA:  Fall. EXAM: LEFT SHOULDER - 2+ VIEW COMPARISON:  None. FINDINGS: Anterior dislocation of the humeral head with displaced posterior greater tuberosity fracture fragment. Mild degenerative changes of the acromioclavicular joint. Soft tissues are unremarkable. IMPRESSION: 1. Anterior shoulder dislocation with displaced posterior greater tuberosity fracture fragment. Electronically Signed   By: Titus Dubin M.D.   On: 05/12/2020 12:57   DG Hip Unilat W or Wo Pelvis 2-3 Views Left  Result Date: 05/12/2020 CLINICAL DATA:  Left hip in a fall secondary pain due to an injury suffered to syncope today. Initial encounter. EXAM: DG HIP (WITH OR WITHOUT PELVIS) 2-3V LEFT COMPARISON:  None. FINDINGS: The patient has an acute left intertrochanteric fracture. The lesser trochanter is a separate fragment. No other acute abnormality is identified. Lower lumbar spondylosis noted. IMPRESSION: Acute left intertrochanteric fracture. Electronically Signed   By: Inge Rise M.D.   On: 05/12/2020 12:57     EKG:  Not done in ED, will get one.   Assessment/Plan Principal Problem:   Closed intertrochanteric fracture of hip, left, initial encounter [facility]) Active Problems:   Mixed hyperlipidemia   GERD   Chronic bronchitis ([place])   Fall at home, initial encounter   Closed fracture dislocation of left shoulder   Diabetes mellitus without complication ([place])   Closed intertrochanteric fracture of hip, left, initial  encounter and closed fracture dislocation of left shoulder ([place]): As evidenced by x-ray. Patient has moderate pain now. No neurovascular compromise. Orthopedic surgeon, Dr. Rudene Christians was consulted.  Likely do surgery tomorrow afternoon  - will admit to Med-surg bed - Pain control: morphine prn and percocet - When necessary Zofran for nausea - Robaxin for muscle spasm - Appreciated Dr. consultation - type and cross - INR/PTT - PT/OT when able to (not ordered now)  Mixed hyperlipidemia -Lipitor-Zetia  GERD -Pepcid  Chronic bronchitis ([place]) -As needed albuterol  Fall at home, initial encounter -PT/OT when able to  Diabetes mellitus without complication [facility]): Recent A1c 6.1, well controlled.  Patient is taking Metformin at home -Sliding scale insulin   Perioperative Cardiac Risk: He has multiple comorbidities, including hypertension, diabetes mellitus, GERD, depression, anxiety, vertigo, chronic bronchitis.  Patient does not have history of CAD or CHF.  Currently patient does not have chest pain or shortness of breath.  Patient is taking aspirin, Zetia and Lipitor at home. No recent acute cardiac issues. At this time point, no further work up is needed. His GUPTA score perioperative myocardial infarction or cardaic arrest is 0.36 %. Since BMP is pending, I used cre data of 06/03/15.   Status is: Inpatient  Remains inpatient appropriate because:Inpatient level of care appropriate due to severity of illness   Dispo: The patient is from: Home              Anticipated d/c is to: SNF              Anticipated d/c date is: 2 days              Patient currently is not medically stable to d/c.     DVT ppx: SCD Code Status: Partial code (  I discussed with the patient in the presence of her son, and explained the meaning of CODE STATUS, patient wants to be partial code, OK for CPR, but no intubation). Family Communication: Yes, patient's son  at bed side Disposition Plan:  Anticipate  discharge back to SNF Consults called:  Dr. Rudene Christians of ortho Admission status: Med-surg bed as inpt        Date of Service 05/12/2020    [facility]   If 7PM-7AM, please contact night-coverage [email or web address] 05/12/2020, 7:25 PM

## 2020-05-13 ENCOUNTER — Inpatient Hospital Stay: Payer: PPO | Admitting: Anesthesiology

## 2020-05-13 ENCOUNTER — Inpatient Hospital Stay: Payer: PPO

## 2020-05-13 ENCOUNTER — Encounter: Payer: Self-pay | Admitting: Internal Medicine

## 2020-05-13 ENCOUNTER — Encounter
Admission: EM | Disposition: A | Discharge status: Skilled Nursing Facility | Payer: Self-pay | Source: Home / Self Care | Attending: Internal Medicine

## 2020-05-13 HISTORY — PX: INTRAMEDULLARY (IM) NAIL INTERTROCHANTERIC: SHX5875

## 2020-05-13 LAB — BASIC METABOLIC PANEL
Anion gap: 13 (ref 5–15)
BUN: 20 mg/dL (ref 8–23)
CO2: 23 mmol/L (ref 22–32)
Calcium: 9.3 mg/dL (ref 8.9–10.3)
Chloride: 103 mmol/L (ref 98–111)
Creatinine, Ser: 0.92 mg/dL (ref 0.44–1.00)
GFR calc Af Amer: >60 mL/min (ref >60)
GFR calc non Af Amer: >60 mL/min (ref >60)
Glucose, Bld: 118 mg/dL — ABNORMAL HIGH (ref 70–99)
Potassium: 4.1 mmol/L (ref 3.5–5.1)
Sodium: 139 mmol/L (ref 135–145)

## 2020-05-13 LAB — CBC
HCT: 33.2 % — ABNORMAL LOW (ref 36.0–46.0)
Hemoglobin: 11.7 g/dL — ABNORMAL LOW (ref 12.0–15.0)
MCH: 30 pg (ref 26.0–34.0)
MCHC: 35.2 g/dL (ref 30.0–36.0)
MCV: 85.1 fL (ref 80.0–100.0)
Platelets: 216 10*3/uL (ref 150–400)
RBC: 3.9 MIL/uL (ref 3.87–5.11)
RDW: 13.8 % (ref 11.5–15.5)
WBC: 12 10*3/uL — ABNORMAL HIGH (ref 4.0–10.5)
nRBC: 0 % (ref 0.0–0.2)

## 2020-05-13 LAB — MRSA PCR SCREENING: MRSA by PCR: NEGATIVE

## 2020-05-13 LAB — GLUCOSE, CAPILLARY
Glucose-Capillary: 108 mg/dL — ABNORMAL HIGH (ref 70–99)
Glucose-Capillary: 114 mg/dL — ABNORMAL HIGH (ref 70–99)
Glucose-Capillary: 111 mg/dL — ABNORMAL HIGH (ref 70–99)
Glucose-Capillary: 105 mg/dL — ABNORMAL HIGH (ref 70–99)

## 2020-05-13 SURGERY — FIXATION, FRACTURE, INTERTROCHANTERIC, WITH INTRAMEDULLARY ROD
Anesthesia: Spinal | Laterality: Left

## 2020-05-13 MED ORDER — SODIUM CHLORIDE 0.9 % IV SOLN: INTRAVENOUS | Status: DC | PRN

## 2020-05-13 MED ORDER — BISACODYL 10 MG RE SUPP: 10.0000 mg | Freq: Every day | RECTAL | Status: DC | PRN

## 2020-05-13 MED ORDER — FENTANYL CITRATE (PF) 100 MCG/2ML IJ SOLN
INTRAMUSCULAR | Status: AC
  Filled 2020-05-13: qty 2

## 2020-05-13 MED ORDER — FENTANYL CITRATE (PF) 100 MCG/2ML IJ SOLN: 25.0000 ug | INTRAMUSCULAR | Status: DC | PRN

## 2020-05-13 MED ORDER — ONDANSETRON HCL 4 MG/2ML IJ SOLN
4.0000 mg | Freq: Four times a day (QID) | INTRAMUSCULAR | Status: DC | PRN
  Administered 2020-05-17: 4 mg via INTRAVENOUS
  Filled 2020-05-13: qty 2

## 2020-05-13 MED ORDER — MAGNESIUM CITRATE PO SOLN
1.0000 | Freq: Once | ORAL | Status: DC | PRN
  Filled 2020-05-13: qty 296

## 2020-05-13 MED ORDER — GLUCERNA SHAKE PO LIQD
237.0000 mL | Freq: Three times a day (TID) | ORAL | Status: DC
  Administered 2020-05-14 – 2020-05-19 (×13): 237 mL via ORAL
  Filled 2020-05-13 (×5): qty 237

## 2020-05-13 MED ORDER — MIDAZOLAM HCL 2 MG/2ML IJ SOLN
INTRAMUSCULAR | Status: AC
  Filled 2020-05-13: qty 2

## 2020-05-13 MED ORDER — BUPIVACAINE HCL (PF) 0.5 % IJ SOLN
INTRAMUSCULAR | Status: AC
  Filled 2020-05-13: qty 10

## 2020-05-13 MED ORDER — SODIUM CHLORIDE 0.9 % IV SOLN: INTRAVENOUS | Status: DC

## 2020-05-13 MED ORDER — BUPIVACAINE HCL (PF) 0.5 % IJ SOLN
INTRAMUSCULAR | Status: DC | PRN
  Administered 2020-05-13: 3 mL via INTRATHECAL

## 2020-05-13 MED ORDER — PHENYLEPHRINE HCL (PRESSORS) 10 MG/ML IV SOLN
INTRAVENOUS | Status: DC | PRN
  Administered 2020-05-13: 100 ug via INTRAVENOUS

## 2020-05-13 MED ORDER — ENOXAPARIN SODIUM 40 MG/0.4ML ~~LOC~~ SOLN
40.0000 mg | SUBCUTANEOUS | Status: DC
  Administered 2020-05-14 – 2020-05-19 (×6): 40 mg via SUBCUTANEOUS
  Filled 2020-05-13 (×5): qty 0.4

## 2020-05-13 MED ORDER — METOCLOPRAMIDE HCL 10 MG PO TABS: 5.0000 mg | ORAL_TABLET | Freq: Three times a day (TID) | ORAL | Status: DC | PRN

## 2020-05-13 MED ORDER — KETAMINE HCL 50 MG/ML IJ SOLN
INTRAMUSCULAR | Status: DC | PRN
  Administered 2020-05-13: 25 mg via INTRAVENOUS

## 2020-05-13 MED ORDER — PHENOL 1.4 % MT LIQD
1.0000 | OROMUCOSAL | Status: DC | PRN
  Filled 2020-05-13: qty 177

## 2020-05-13 MED ORDER — DOCUSATE SODIUM 100 MG PO CAPS
100.0000 mg | ORAL_CAPSULE | Freq: Two times a day (BID) | ORAL | Status: DC
  Administered 2020-05-13 – 2020-05-19 (×12): 100 mg via ORAL
  Filled 2020-05-13 (×12): qty 1

## 2020-05-13 MED ORDER — ONDANSETRON HCL 4 MG PO TABS: 4.0000 mg | ORAL_TABLET | Freq: Four times a day (QID) | ORAL | Status: DC | PRN

## 2020-05-13 MED ORDER — CEFAZOLIN SODIUM-DEXTROSE 1-4 GM/50ML-% IV SOLN
1.0000 g | Freq: Four times a day (QID) | INTRAVENOUS | Status: AC
  Administered 2020-05-13 – 2020-05-14 (×3): 1 g via INTRAVENOUS
  Filled 2020-05-13 (×3): qty 50

## 2020-05-13 MED ORDER — MENTHOL 3 MG MT LOZG
1.0000 | LOZENGE | OROMUCOSAL | Status: DC | PRN
  Filled 2020-05-13: qty 9

## 2020-05-13 MED ORDER — METOCLOPRAMIDE HCL 5 MG/ML IJ SOLN: 5.0000 mg | Freq: Three times a day (TID) | INTRAMUSCULAR | Status: DC | PRN

## 2020-05-13 MED ORDER — MIDAZOLAM HCL 5 MG/5ML IJ SOLN
INTRAMUSCULAR | Status: DC | PRN
  Administered 2020-05-13 (×2): 1 mg via INTRAVENOUS

## 2020-05-13 MED ORDER — MAGNESIUM HYDROXIDE 400 MG/5ML PO SUSP: 30.0000 mL | Freq: Every day | ORAL | Status: DC | PRN

## 2020-05-13 MED ORDER — PROPOFOL 500 MG/50ML IV EMUL
INTRAVENOUS | Status: DC | PRN
  Administered 2020-05-13: 35 ug/kg/min via INTRAVENOUS

## 2020-05-13 MED ORDER — ONDANSETRON HCL 4 MG/2ML IJ SOLN: 4.0000 mg | Freq: Once | INTRAMUSCULAR | Status: DC | PRN

## 2020-05-13 MED ORDER — LIDOCAINE HCL (PF) 2 % IJ SOLN
INTRAMUSCULAR | Status: DC | PRN
  Administered 2020-05-13: 25 mg

## 2020-05-13 MED ORDER — PROPOFOL 10 MG/ML IV BOLUS
INTRAVENOUS | Status: DC | PRN
  Administered 2020-05-13: 30 mg via INTRAVENOUS

## 2020-05-13 MED ORDER — FENTANYL CITRATE (PF) 100 MCG/2ML IJ SOLN
INTRAMUSCULAR | Status: DC | PRN
Start: 2020-05-13 — End: 2020-05-13
  Administered 2020-05-13: 50 ug via INTRAVENOUS
  Administered 2020-05-13 (×2): 25 ug via INTRAVENOUS

## 2020-05-13 MED ORDER — ALUM & MAG HYDROXIDE-SIMETH 200-200-20 MG/5ML PO SUSP: 30.0000 mL | ORAL | Status: DC | PRN

## 2020-05-13 MED ORDER — ONDANSETRON HCL 4 MG/2ML IJ SOLN
INTRAMUSCULAR | Status: DC | PRN
  Administered 2020-05-13: 4 mg via INTRAVENOUS

## 2020-05-13 SURGICAL SUPPLY — 33 items
BIT DRILL 4.3MMS DISTAL GRDTED (BIT) ×1 IMPLANT
CANISTER SUCT 1200ML W/VALVE (MISCELLANEOUS) ×3 IMPLANT
CHLORAPREP W/TINT 26 (MISCELLANEOUS) ×3 IMPLANT
COVER WAND RF STERILE (DRAPES) ×3 IMPLANT
DRAPE 3/4 80X56 (DRAPES) ×3 IMPLANT
DRAPE U-SHAPE 47X51 STRL (DRAPES) ×3 IMPLANT
DRILL 4.3MMS DISTAL GRADUATED (BIT) ×3
DRSG OPSITE POSTOP 3X4 (GAUZE/BANDAGES/DRESSINGS) ×6 IMPLANT
GLOVE BIOGEL PI IND STRL 9 (GLOVE) ×1 IMPLANT
GLOVE BIOGEL PI INDICATOR 9 (GLOVE) ×2
GLOVE SURG SYN 9.0  PF PI (GLOVE) ×3
GLOVE SURG SYN 9.0 PF PI (GLOVE) ×1 IMPLANT
GOWN SRG 2XL LVL 4 RGLN SLV (GOWNS) ×1 IMPLANT
GOWN STRL NON-REIN 2XL LVL4 (GOWNS) ×3
GOWN STRL REUS W/ TWL LRG LVL3 (GOWN DISPOSABLE) ×1 IMPLANT
GOWN STRL REUS W/TWL LRG LVL3 (GOWN DISPOSABLE) ×3
GUIDEPIN VERSANAIL DSP 3.2X444 (ORTHOPEDIC DISPOSABLE SUPPLIES) ×3 IMPLANT
GUIDEWIRE BALL NOSE 100CM (WIRE) ×3 IMPLANT
HFN LH 130 DEG 9MM X 360MM (Nail) ×3 IMPLANT
KIT TURNOVER KIT A (KITS) ×3 IMPLANT
MAT ABSORB  FLUID 56X50 GRAY (MISCELLANEOUS) ×3
MAT ABSORB FLUID 56X50 GRAY (MISCELLANEOUS) ×1 IMPLANT
NEEDLE FILTER BLUNT 18X 1/2SAF (NEEDLE) ×2
NEEDLE FILTER BLUNT 18X1 1/2 (NEEDLE) ×1 IMPLANT
NS IRRIG 500ML POUR BTL (IV SOLUTION) ×3 IMPLANT
PACK HIP COMPR (MISCELLANEOUS) ×3 IMPLANT
SCALPEL PROTECTED #15 DISP (BLADE) ×6 IMPLANT
SCREW BONE CORTICAL 5.0X44 (Screw) ×3 IMPLANT
SCREW LAG HIP NAIL 10.5X95 (Screw) ×3 IMPLANT
STAPLER SKIN PROX 35W (STAPLE) ×3 IMPLANT
SUT VIC AB 1 CT1 36 (SUTURE) ×3 IMPLANT
SUT VIC AB 2-0 CT1 (SUTURE) ×3 IMPLANT
SYR 10ML LL (SYRINGE) ×3 IMPLANT

## 2020-05-13 NOTE — Anesthesia Procedure Notes (Addendum)
Spinal  Patient location during procedure: OR Start time: 05/13/2020 3:30 PM End time: 05/13/2020 3:48 PM Staffing Performed: anesthesiologist and resident/CRNA  Anesthesiologist: Martha Clan, MD Resident/CRNA: Rolla Plate, CRNA Preanesthetic Checklist Completed: patient identified, IV checked, site marked, risks and benefits discussed, surgical consent, monitors and equipment checked, pre-op evaluation and timeout performed Spinal Block Patient position: sitting Prep: ChloraPrep Patient monitoring: heart rate, continuous pulse ox, blood pressure and cardiac monitor Approach: midline Location: L2-3 Injection technique: single-shot Needle Needle type: Whitacre and Introducer  Needle gauge: 24 G Needle length: 9 cm Assessment Sensory level: T10 Additional Notes Negative paresthesia. Negative blood return. Positive free-flowing CSF. Expiration date of kit checked and confirmed. Patient tolerated procedure well, without complications.

## 2020-05-13 NOTE — Progress Notes (Addendum)
Initial Nutrition Assessment  DOCUMENTATION CODES:   Not applicable  INTERVENTION:  Once diet is advanced provide Glucerna Shake po TID, each supplement provides 220 kcal and 10 grams of protein.  Continue MVI daily.  NUTRITION DIAGNOSIS:   Increased nutrient needs related to post-op healing as evidenced by estimated needs.  GOAL:   Patient will meet greater than or equal to 90% of their needs  MONITOR:   PO intake, Supplement acceptance, Diet advancement, Labs, Weight trends, I & O's, Skin  REASON FOR ASSESSMENT:   Consult Assessment of nutrition requirement/status  ASSESSMENT:   74 year old female with PMHx of DM, HLD, GERD, anxiety admitted after fall with left hip fracture.   Unable to meet with patient at bedside as she is down in OR. Patient has been NPO today for surgery. Patient was on carbohydrate modified diet yesterday but no meal documentation available in chart. Will monitor for diet intake after surgery and adequacy of intake.   Per review of weight history in chart patient was 69-72 kg in 2016, 59 kg on 10/17/2016. No recent weight history. She is currently documented to be 67.1 kg (148 lbs).   Medications reviewed and include: vitamin D3 1000  Units daily, famotidine, Novolog 0-9 units TID, Novolog 0-5 units QHS, MVI daily.  Labs reviewed: CBG 108-174.  Unable to determine if patient meets criteria for malnutrition at this time.  NUTRITION - FOCUSED PHYSICAL EXAM:  Unable to complete as pt in OR at this time.  Diet Order:   Diet Order            Diet NPO time specified Except for: Ice Chips, Sips with Meds  Diet effective midnight                EDUCATION NEEDS:   No education needs have been identified at this time  Skin:  Skin Assessment: Reviewed RN Assessment  Last BM:  05/12/2020  Height:   Ht Readings from Last 1 Encounters:  05/12/20 5\' 5"  (1.651 m)   Weight:   Wt Readings from Last 1 Encounters:  05/12/20 67.1 kg   Ideal  Body Weight:  56.8 kg  BMI:  Body mass index is 24.63 kg/m.  Estimated Nutritional Needs:   Kcal:  1700-1900  Protein:  85-95 grams  Fluid:  1.7 L/day  Jacklynn Barnacle, MS, RD, LDN Pager number available on Amion

## 2020-05-13 NOTE — Transfer of Care (Signed)
Immediate Anesthesia Transfer of Care Note  Patient: Sarah Velasquez  Procedure(s) Performed: INTRAMEDULLARY (IM) NAIL INTERTROCHANTRIC (Left )  Patient Location: PACU  Anesthesia Type:Spinal  Level of Consciousness: awake  Airway & Oxygen Therapy: Patient Spontanous Breathing and Patient connected to face mask oxygen  Post-op Assessment: Report given to RN and Post -op Vital signs reviewed and stable  Post vital signs: Reviewed  Last Vitals:  Vitals Value Taken Time  BP 104/60 05/13/20 1658  Temp    Pulse 78 05/13/20 1658  Resp 14 05/13/20 1658  SpO2 95 % 05/13/20 1658  Vitals shown include unvalidated device data.  Last Pain:  Vitals:   05/13/20 1128  TempSrc: Oral  PainSc:       Patients Stated Pain Goal: 2 (66/59/93 5701)  Complications: No complications documented.

## 2020-05-13 NOTE — Plan of Care (Signed)
  Problem: Health Behavior/Discharge Planning: Goal: Ability to manage health-related needs will improve 05/13/2020 1340 by Trula Slade, RN Outcome: Progressing 05/13/2020 1340 by Trula Slade, RN Outcome: Progressing   Problem: Education: Goal: Knowledge of General Education information will improve Description: Including pain rating scale, medication(s)/side effects and non-pharmacologic comfort measures Outcome: Progressing   Problem: Health Behavior/Discharge Planning: Goal: Ability to manage health-related needs will improve Outcome: Progressing   Problem: Clinical Measurements: Goal: Ability to maintain clinical measurements within normal limits will improve Outcome: Progressing Goal: Will remain free from infection Outcome: Progressing Goal: Diagnostic test results will improve Outcome: Progressing Goal: Respiratory complications will improve Outcome: Progressing Goal: Cardiovascular complication will be avoided Outcome: Progressing   Problem: Activity: Goal: Risk for activity intolerance will decrease Outcome: Progressing   Problem: Nutrition: Goal: Adequate nutrition will be maintained Outcome: Progressing   Problem: Coping: Goal: Level of anxiety will decrease Outcome: Progressing   Problem: Elimination: Goal: Will not experience complications related to bowel motility Outcome: Progressing Goal: Will not experience complications related to urinary retention Outcome: Progressing   Problem: Pain Managment: Goal: General experience of comfort will improve Outcome: Progressing   Problem: Safety: Goal: Ability to remain free from injury will improve Outcome: Progressing   Problem: Skin Integrity: Goal: Risk for impaired skin integrity will decrease Outcome: Progressing

## 2020-05-13 NOTE — Op Note (Signed)
05/13/2020  4:55 PM  PATIENT:  Sarah Velasquez  74 y.o. female  PRE-OPERATIVE DIAGNOSIS:  Left Femur Fracture  POST-OPERATIVE DIAGNOSIS:  Left Femur Fracture  PROCEDURE:  Procedure(s): INTRAMEDULLARY (IM) NAIL INTERTROCHANTRIC (Left)  SURGEON: Laurene Footman, MD  ASSISTANTS: None  ANESTHESIA:   spinal  EBL:  Total I/O In: 1050 [I.V.:1000; IV Piggyback:50] Out: -   BLOOD ADMINISTERED:none  DRAINS: none   LOCAL MEDICATIONS USED:  NONE  SPECIMEN:  No Specimen  DISPOSITION OF SPECIMEN:  N/A  COUNTS:  YES  TOURNIQUET:  * No tourniquets in log *  IMPLANTS: Biomet affixes 9 x 360 left rod with 95 mm leg screw and 44 mm distal interlocking screw  DICTATION: .Dragon Dictation patient brought the operating room and after adequate anesthesia was obtained she was placed on the fracture table.  The right leg was in the well-leg holder left leg in the traction boot with slight traction applied.  C arm was brought in and good visualization with anatomic alignment obtained.  After prepping and draping using the barrier drape method appropriate patient identification and timeout procedure were completed.  A small incision was made proximally and a guidewire inserted to the tip of the trochanter in the appropriate position in both AP and lateral projections.  Proximal reaming was carried out followed by placement of the long guidewire measurement made off of this 11 mm reaming carried out and a 9 x 360 rod chosen and inserted down the canal.  When the rod was at the appropriate position a small lateral incision made in the lateral guide inserted with a guidewire inserted into a center center position in the head when this was achieved this was measured drilled and a 95 mm leg screw inserted.  Traction was released and compression applied followed by tightening the proximal screw with a quarter turn off to allow for further compression.  The proximal insertion handle was removed after permanent C-arm  views obtained in AP and lateral projections proximally.  Going distally lateral view of the distal femur showed the slotted screw hole a small incision was made drilling was made across the slotted screw hole measured and the 5.0 cortical screw placed with wounds then irrigated and closed with #1 Vicryl for the deep fascia 2-0 Vicryl subcutaneously followed by skin staples.  Honeycomb dressing applied to the two distal incisions with Xeroform and ABD with foam tape to the proximal incision.  PLAN OF CARE: Continue as inpatient  PATIENT DISPOSITION:  PACU - hemodynamically stable.

## 2020-05-13 NOTE — Anesthesia Preprocedure Evaluation (Addendum)
Anesthesia Evaluation  Patient identified by MRN, date of birth, ID band Patient awake    Reviewed: Allergy & Precautions, H&P , NPO status , Patient's Chart, lab work & pertinent test results, reviewed documented beta blocker date and time   History of Anesthesia Complications (+) PONV and history of anesthetic complications  Airway Mallampati: II  TM Distance: <3 FB Neck ROM: full    Dental no notable dental hx. (+) Dental Advisory Given   Pulmonary COPD,    Pulmonary exam normal breath sounds clear to auscultation       Cardiovascular negative cardio ROS Normal cardiovascular exam Rhythm:regular Rate:Normal     Neuro/Psych PSYCHIATRIC DISORDERS Anxiety Depression  Neuromuscular disease    GI/Hepatic hiatal hernia, GERD  ,  Endo/Other  diabetes  Renal/GU      Musculoskeletal  (+) Arthritis ,   Abdominal   Peds negative pediatric ROS (+)  Hematology negative hematology ROS (+)   Anesthesia Other Findings Past Medical History: No date: Anxiety 02/15-16/2009: Chest pain     Comment:  Hospital [facility]  chest pain rule out //stress Myoview               negative 10/21/2007//Stress cardiolite ;apical thinning               ,negative ischemia (09/14/2003) No date: Diabetes mellitus     Comment:  typeII 09/2003: Diverticulosis of colon     Comment:  colonoscopy ext.and internal hemorrhoids (Dr. Vira Agar               )12/03/2006// colonoscopy polyps multiple  benign;               interal hemorrhoids ,divertics 09/16/2003 No date: Drug-induced constipation No date: Environmental allergies No date: GERD (gastroesophageal reflux disease)     Comment:  EGD hiatal hernia; esophagitis/sigmoidoscopy WNL (Dr.               Vira Agar) 03/04/1996: ABD. U/S  WNL (06/1996)//EGD               multiple polyps ;small H.H (09/16/2003) No date: H/O hiatal hernia 05/1995: Hyperlipidemia 12/2011: Osteopenia     Comment:  spine -1.8,  hip -0.7 No date: PONV (postoperative nausea and vomiting) 2013: Right lumbar radiculitis     Comment:  DDD, spondylosis, L3 radiculitis (MRI 07/2012) - ESI               09/2011 (Dr Sharlet Salina)  No date: Vertigo     Comment:  hx of Fracture of left proximal humerus...closed reduced  Reproductive/Obstetrics                            Anesthesia Physical  Anesthesia Plan  ASA: III  Anesthesia Plan:    Post-op Pain Management:    Induction: Intravenous  PONV Risk Score and Plan:   Airway Management Planned:   Additional Equipment:   Intra-op Plan:   Post-operative Plan:   Informed Consent: I have reviewed the patients History and Physical, chart, labs and discussed the procedure including the risks, benefits and alternatives for the proposed anesthesia with the patient or authorized representative who has indicated his/her understanding and acceptance.     Dental advisory given  Plan Discussed with: CRNA, Anesthesiologist and Surgeon  Anesthesia Plan Comments:         Anesthesia Quick Evaluation

## 2020-05-13 NOTE — Plan of Care (Signed)
  Problem: Health Behavior/Discharge Planning: Goal: Ability to manage health-related needs will improve Outcome: Progressing   

## 2020-05-13 NOTE — Progress Notes (Signed)
PROGRESS NOTE    Sarah Velasquez  QVZ:[id number] DOB: 1946/02/01 DOA: 05/12/2020 PCP: Dion Body, MD   Chief complaint.  Hip pain.  Brief Narrative:  Sarah Velasquez is a 74 y.o. female with medical history significant of hypertension, diabetes mellitus, GERD, depression, anxiety, vertigo, chronic bronchitis, who presents with fall and pain in left hip and shoulder. Patient had accidental fall at home, he sustained a left hip fracture.  Scheduled for surgery by orthopedics.   Assessment & Plan:   Principal Problem:   Closed intertrochanteric fracture of hip, left, initial encounter [facility]) Active Problems:   Mixed hyperlipidemia   GERD   Chronic bronchitis ([place])   Fall at home, initial encounter   Closed fracture dislocation of left shoulder   Diabetes mellitus without complication ([place])  #1.  Left hip fracture. Surgery is scheduled by orthopedics.  Prophylaxis started.  2.  Chronic bronchitis. Stable.  3.  Type 2 diabetes. Continue sliding scale insulin.    DVT prophylaxis: Lovenox Code Status: Full Family Communication: None Disposition Plan: Pending    Status is: Inpatient  Remains inpatient appropriate because:Inpatient level of care appropriate due to severity of illness   Dispo: The patient is from: Home              Anticipated d/c is to: Pending decision.              Anticipated d/c date is: 3 days              Patient currently is not medically stable to d/c.        I/O last 3 completed shifts: In: -  Out: 450 [Urine:450] No intake/output data recorded.     Consultants:   Orthopedics.  Procedures: Hip surgery pending  Antimicrobials:  Prophylactic antibiotics given by orthopedics Subjective: Doing well today, hip pain uncontrolled.  Denies any short of breath or cough.  No nausea vomiting.  No abdominal pain. No dysuria hematuria No fever or chills.  Objective: Vitals:   05/12/20 1939 05/12/20 2310 05/13/20 0518 05/13/20 0757  BP:  (!) 107/53 106/65 (!) 106/53 (!) 112/54  Pulse: 73 79 72 70  Resp: 14 16 18 18   Temp: 98.2 F (36.8 C) 98.9 F (37.2 C) 98.2 F (36.8 C) 98.5 F (36.9 C)  TempSrc: Oral Oral Oral Oral  SpO2: 97% 94% 96% 99%  Weight:      Height:        Intake/Output Summary (Last 24 hours) at 05/13/2020 1115 Last data filed at 05/13/2020 3295 Gross per 24 hour  Intake --  Output 450 ml  Net -450 ml   Filed Weights   05/12/20 1201  Weight: 67.1 kg    Examination:  General exam: Appears calm and comfortable  Respiratory system: Clear to auscultation. Respiratory effort normal. Cardiovascular system: S1 & S2 heard, RRR. No JVD, murmurs, rubs, gallops or clicks. No pedal edema. Gastrointestinal system: Abdomen is nondistended, soft and nontender. No organomegaly or masses felt. Normal bowel sounds heard. Central nervous system: Alert and oriented. No focal neurological deficits. Extremities: Symmetric 5 x 5 power. Skin: No rashes, lesions or ulcers Psychiatry: Judgement and insight appear normal. Mood & affect appropriate.     Data Reviewed: I have personally reviewed following labs and imaging studies  CBC: Recent Labs  Lab 05/12/20 1908 05/13/20 0554  WBC 17.4* 12.0*  NEUTROABS 15.6*  --   HGB 12.2 11.7*  HCT 36.2 33.2*  MCV 88.1 85.1  PLT 234 216  Basic Metabolic Panel: Recent Labs  Lab 05/12/20 1908 05/13/20 0554  NA 142 139  K 4.1 4.1  CL 107 103  CO2 22 23  GLUCOSE 170* 118*  BUN 21 20  CREATININE 0.90 0.92  CALCIUM 9.6 9.3   GFR: Estimated Creatinine Clearance: 48.3 mL/min (by C-G formula based on SCr of 0.92 mg/dL). Liver Function Tests: No results for input(s): AST, ALT, ALKPHOS, BILITOT, PROT, ALBUMIN in the last 168 hours. No results for input(s): LIPASE, AMYLASE in the last 168 hours. No results for input(s): AMMONIA in the last 168 hours. Coagulation Profile: Recent Labs  Lab 05/12/20 1908  INR 1.1   Cardiac Enzymes: No results for input(s):  CKTOTAL, CKMB, CKMBINDEX, TROPONINI in the last 168 hours. BNP (last 3 results) No results for input(s): PROBNP in the last 8760 hours. HbA1C: No results for input(s): HGBA1C in the last 72 hours. CBG: Recent Labs  Lab 05/12/20 2203 05/13/20 0758  GLUCAP 174* 108*   Lipid Profile: No results for input(s): CHOL, HDL, LDLCALC, TRIG, CHOLHDL, LDLDIRECT in the last 72 hours. Thyroid Function Tests: No results for input(s): TSH, T4TOTAL, FREET4, T3FREE, THYROIDAB in the last 72 hours. Anemia Panel: No results for input(s): VITAMINB12, FOLATE, FERRITIN, TIBC, IRON, RETICCTPCT in the last 72 hours. Sepsis Labs: No results for input(s): PROCALCITON, LATICACIDVEN in the last 168 hours.  Recent Results (from the past 240 hour(s))  SARS Coronavirus 2 by RT PCR (hospital order, performed in [facility] hospital lab) Nasopharyngeal Nasopharyngeal Swab     Status: None   Collection Time: 05/12/20  3:58 PM   Specimen: Nasopharyngeal Swab  Result Value Ref Range Status   SARS Coronavirus 2 NEGATIVE NEGATIVE Final    Comment: (NOTE) SARS-CoV-2 target nucleic acids are NOT DETECTED.  The SARS-CoV-2 RNA is generally detectable in upper and lower respiratory specimens during the acute phase of infection. The lowest concentration of SARS-CoV-2 viral copies this assay can detect is 250 copies / mL. A negative result does not preclude SARS-CoV-2 infection and should not be used as the sole basis for treatment or other patient management decisions.  A negative result may occur with improper specimen collection / handling, submission of specimen other than nasopharyngeal swab, presence of viral mutation(s) within the areas targeted by this assay, and inadequate number of viral copies (<250 copies / mL). A negative result must be combined with clinical observations, patient history, and epidemiological information.  Fact Sheet for Patients:   StrictlyIdeas.no  Fact Sheet  for Healthcare Providers: BankingDealers.co.za  This test is not yet approved or  cleared by the [place] FDA and has been authorized for detection and/or diagnosis of SARS-CoV-2 by FDA under an Emergency Use Authorization (EUA).  This EUA will remain in effect (meaning this test can be used) for the duration of the COVID-19 declaration under Section 564(b)(1) of the Act, 21 U.S.C. section 360bbb-3(b)(1), unless the authorization is terminated or revoked sooner.  Performed at [facility], [address] [place], [place] [address]   MRSA PCR Screening     Status: None   Collection Time: 05/13/20  7:34 AM   Specimen: Nasopharyngeal  Result Value Ref Range Status   MRSA by PCR NEGATIVE NEGATIVE Final    Comment:        The GeneXpert MRSA Assay (FDA approved for NASAL specimens only), is one component of a comprehensive MRSA colonization surveillance program. It is not intended to diagnose MRSA infection nor to guide or monitor treatment for MRSA  infections. Performed at [facility], [address] [place], [place] [address]          Radiology Studies: DG Chest 1 View  Result Date: 05/12/2020 CLINICAL DATA:  Syncopal episode. Fell and injured left shoulder and left hip. EXAM: CHEST  1 VIEW COMPARISON:  Chest x-ray 07/12/2015 FINDINGS: The cardiac silhouette, mediastinal and hilar contours are within normal limits and stable. The lungs are clear of an acute process. No pleural effusion or pneumothorax. Fracture dislocation noted involving the left shoulder. The right shoulder is intact. No definite rib fractures. IMPRESSION: 1. No acute cardiopulmonary findings. 2. Left shoulder fracture dislocation. Electronically Signed   By: Marijo Sanes M.D.   On: 05/12/2020 12:57   CT HEAD WO CONTRAST  Result Date: 05/12/2020 CLINICAL DATA:  Syncope with fall EXAM: CT HEAD WITHOUT CONTRAST TECHNIQUE: Contiguous axial images were obtained  from the base of the skull through the vertex without intravenous contrast. COMPARISON:  None. FINDINGS: Brain: There is age related volume loss. There is no intracranial mass, hemorrhage, extra-axial fluid collection, or midline shift. There is slight small vessel disease in the centra semiovale bilaterally. No acute infarct evident. Vascular: No hyperdense vessel. There is mild calcification in the carotid siphon regions bilaterally. Skull: Bony calvarium appears intact. Sinuses/Orbits: There is mucosal thickening in several ethmoid air cells. Other paranasal sinuses are clear. Orbits appear symmetric bilaterally. Other: Mastoid air cells are clear. IMPRESSION: Age related volume loss with slight periventricular small vessel disease. No acute infarct. No mass or hemorrhage. Foci of arterial vascular calcification noted. Mucosal thickening noted in several ethmoid air cells. Electronically Signed   By: Lowella Grip III M.D.   On: 05/12/2020 14:02   DG Shoulder Left  Result Date: 05/12/2020 CLINICAL DATA:  Fall. EXAM: LEFT SHOULDER - 2+ VIEW COMPARISON:  None. FINDINGS: Anterior dislocation of the humeral head with displaced posterior greater tuberosity fracture fragment. Mild degenerative changes of the acromioclavicular joint. Soft tissues are unremarkable. IMPRESSION: 1. Anterior shoulder dislocation with displaced posterior greater tuberosity fracture fragment. Electronically Signed   By: Titus Dubin M.D.   On: 05/12/2020 12:57   DG Hip Unilat W or Wo Pelvis 2-3 Views Left  Result Date: 05/12/2020 CLINICAL DATA:  Left hip in a fall secondary pain due to an injury suffered to syncope today. Initial encounter. EXAM: DG HIP (WITH OR WITHOUT PELVIS) 2-3V LEFT COMPARISON:  None. FINDINGS: The patient has an acute left intertrochanteric fracture. The lesser trochanter is a separate fragment. No other acute abnormality is identified. Lower lumbar spondylosis noted. IMPRESSION: Acute left  intertrochanteric fracture. Electronically Signed   By: Inge Rise M.D.   On: 05/12/2020 12:57        Scheduled Meds:  aspirin EC  81 mg Oral Daily   atorvastatin  80 mg Oral Daily   cholecalciferol  1,000 Units Oral Daily   escitalopram  5 mg Oral Daily   ezetimibe  10 mg Oral Daily   famotidine  20 mg Oral BID   insulin aspart  0-5 Units Subcutaneous QHS   insulin aspart  0-9 Units Subcutaneous TID WC   loratadine  10 mg Oral Daily   multivitamin with minerals  1 tablet Oral Daily   Continuous Infusions:   ceFAZolin (ANCEF) IV       LOS: 1 day    Time spent: 27 minutes    Sharen Hones, MD [facility]   To contact the attending provider between 7A-7P or the covering provider during  after hours 7P-7A, please log into the web site [email or web address] and access using universal [place] password for that web site. If you do not have the password, please call the hospital operator.  05/13/2020, 11:15 AM

## 2020-05-14 ENCOUNTER — Encounter: Payer: Self-pay | Admitting: Orthopedic Surgery

## 2020-05-14 DIAGNOSIS — I9589 Other hypotension: Secondary | ICD-10-CM

## 2020-05-14 DIAGNOSIS — E861 Hypovolemia: Secondary | ICD-10-CM

## 2020-05-14 DIAGNOSIS — D62 Acute posthemorrhagic anemia: Secondary | ICD-10-CM

## 2020-05-14 DIAGNOSIS — I959 Hypotension, unspecified: Secondary | ICD-10-CM

## 2020-05-14 DIAGNOSIS — R410 Disorientation, unspecified: Secondary | ICD-10-CM

## 2020-05-14 LAB — URINALYSIS, COMPLETE (UACMP) WITH MICROSCOPIC
Bacteria, UA: NONE SEEN
Bilirubin Urine: NEGATIVE
Glucose, UA: NEGATIVE mg/dL
Hgb urine dipstick: NEGATIVE
Ketones, ur: 80 mg/dL — AB
Leukocytes,Ua: NEGATIVE
Nitrite: NEGATIVE
Protein, ur: NEGATIVE mg/dL
Specific Gravity, Urine: 1.018 (ref 1.005–1.030)
pH: 5 (ref 5.0–8.0)

## 2020-05-14 LAB — GLUCOSE, CAPILLARY
Glucose-Capillary: 101 mg/dL — ABNORMAL HIGH (ref 70–99)
Glucose-Capillary: 83 mg/dL (ref 70–99)
Glucose-Capillary: 97 mg/dL (ref 70–99)
Glucose-Capillary: 131 mg/dL — ABNORMAL HIGH (ref 70–99)

## 2020-05-14 LAB — BASIC METABOLIC PANEL
Anion gap: 13 (ref 5–15)
BUN: 13 mg/dL (ref 8–23)
CO2: 19 mmol/L — ABNORMAL LOW (ref 22–32)
Calcium: 8.1 mg/dL — ABNORMAL LOW (ref 8.9–10.3)
Chloride: 107 mmol/L (ref 98–111)
Creatinine, Ser: 0.77 mg/dL (ref 0.44–1.00)
GFR calc Af Amer: >60 mL/min (ref >60)
GFR calc non Af Amer: >60 mL/min (ref >60)
Glucose, Bld: 128 mg/dL — ABNORMAL HIGH (ref 70–99)
Potassium: 3.2 mmol/L — ABNORMAL LOW (ref 3.5–5.1)
Sodium: 139 mmol/L (ref 135–145)

## 2020-05-14 LAB — CBC
HCT: 26.4 % — ABNORMAL LOW (ref 36.0–46.0)
Hemoglobin: 9.1 g/dL — ABNORMAL LOW (ref 12.0–15.0)
MCH: 30.6 pg (ref 26.0–34.0)
MCHC: 34.5 g/dL (ref 30.0–36.0)
MCV: 88.9 fL (ref 80.0–100.0)
Platelets: 162 10*3/uL (ref 150–400)
RBC: 2.97 MIL/uL — ABNORMAL LOW (ref 3.87–5.11)
RDW: 13.6 % (ref 11.5–15.5)
WBC: 11.2 10*3/uL — ABNORMAL HIGH (ref 4.0–10.5)
nRBC: 0 % (ref 0.0–0.2)

## 2020-05-14 MED ORDER — POTASSIUM CHLORIDE 20 MEQ PO PACK
40.0000 meq | PACK | ORAL | Status: AC
  Administered 2020-05-14 (×2): 40 meq via ORAL
  Filled 2020-05-14 (×2): qty 2

## 2020-05-14 MED ORDER — FE FUMARATE-B12-VIT C-FA-IFC PO CAPS
1.0000 | ORAL_CAPSULE | Freq: Two times a day (BID) | ORAL | Status: DC
  Administered 2020-05-14 – 2020-05-19 (×11): 1 via ORAL
  Filled 2020-05-14 (×13): qty 1

## 2020-05-14 MED ORDER — SODIUM CHLORIDE 0.9 % IV BOLUS
500.0000 mL | Freq: Once | INTRAVENOUS | Status: AC
  Administered 2020-05-14: 500 mL via INTRAVENOUS

## 2020-05-14 NOTE — Care Management Important Message (Signed)
Important Message  Patient Details  Name: Sarah Velasquez MRN: [id number] Date of Birth: 12/03/1945   Medicare Important Message Given:  Yes  Initial Medicare IM given by Patient Access Associate on 05/13/2020 at 2:04pm.     Dannette Barbara 05/14/2020, 8:22 AM

## 2020-05-14 NOTE — NC FL2 (Signed)
[place] LEVEL OF CARE SCREENING TOOL     IDENTIFICATION  Patient Name: Sarah Velasquez Birthdate: 02/02/1946 Sex: female Admission Date (Current Location): 05/12/2020  [place] and [place] Number:  Engineering geologist and Address:  [facility], [address] [place], [place] [address]      Provider Number: [id number]  Attending Physician Name and Address:  Sharen Hones, MD  Relative Name and Phone Number:  Brylee Berk [phone]    Current Level of Care: Hospital Recommended Level of Care: [place] Prior Approval Number:    Date Approved/Denied:   PASRR Number:    Discharge Plan: SNF    Current Diagnoses: Patient Active Problem List   Diagnosis Date Noted  . Hypotension 05/14/2020  . Delirium with dementia 05/14/2020  . Acute blood loss anemia 05/14/2020  . Closed intertrochanteric fracture of hip, left, initial encounter ([place]) 05/12/2020  . Fall at home, initial encounter 05/12/2020  . Closed fracture dislocation of left shoulder 05/12/2020  . Diabetes mellitus without complication ([place]) 28/41/3244  . Depression 05/12/2020  . Chronic bronchitis ([place]) 07/26/2015  . Advanced care planning/counseling discussion 06/09/2014  . Caregiver burden 12/04/2013  . Medicare annual wellness visit, subsequent 09/21/2012  . Degenerative disc disease, lumbar 06/29/2012  . Osteopenia 12/04/2011  . Vitamin D deficiency 10/12/2009  . Mixed hyperlipidemia 07/26/2007  . ALLERGIC RHINITIS 01/09/2007  . FIBROCYSTIC BREAST DISEASE 01/09/2007  . DISORDER, MENOPAUSAL NEC 01/09/2007  . Diabetes type 2, controlled ([place]) 01/07/2007  . GERD 01/07/2007  . ANXIETY DISORDER, GENERALIZED 07/18/2006    Orientation RESPIRATION BLADDER Height & Weight     Self, Time, Place  Normal External catheter Weight: 67.1 kg Height:  5\' 5"  (165.1 cm)  BEHAVIORAL SYMPTOMS/MOOD NEUROLOGICAL BOWEL NUTRITION STATUS      Continent Diet (Carb Modified)   AMBULATORY STATUS COMMUNICATION OF NEEDS Skin   Extensive Assist Verbally Surgical wounds                       Personal Care Assistance Level of Assistance  Dressing, Feeding, Bathing Bathing Assistance: Maximum assistance Feeding assistance: Limited assistance Dressing Assistance: Maximum assistance     Functional Limitations Info  Speech, Hearing, Sight Sight Info: Adequate Hearing Info: Adequate Speech Info: Adequate    SPECIAL CARE FACTORS FREQUENCY  PT (By licensed PT), OT (By licensed OT)                    Contractures Contractures Info: Not present    Additional Factors Info  Code Status, Allergies Code Status Info: Partial Allergies Info: Shellfish, Meperidine, Azithromycin, Neurontin, Omeprazole           Current Medications (05/14/2020):  This is the current hospital active medication list Current Facility-Administered Medications  Medication Dose Route Frequency Provider Last Rate Last Admin  . 0.9 %  sodium chloride infusion   Intravenous Continuous Hessie Knows, MD 75 mL/hr at 05/14/20 0937 New Bag at 05/14/20 0102  . albuterol (PROVENTIL) (2.5 MG/3ML) 0.083% nebulizer solution 3 mL  3 mL Inhalation Q4H PRN Hessie Knows, MD      . alum & mag hydroxide-simeth (MAALOX/MYLANTA) 200-200-20 MG/5ML suspension 30 mL  30 mL Oral Q4H PRN Hessie Knows, MD      . aspirin EC tablet 81 mg  81 mg Oral Daily Hessie Knows, MD   81 mg at 05/14/20 7253  . atorvastatin (LIPITOR) tablet 80 mg  80 mg Oral Daily Hessie Knows, MD  80 mg at 05/14/20 0923  . bisacodyl (DULCOLAX) suppository 10 mg  10 mg Rectal Daily PRN Hessie Knows, MD      . cholecalciferol (VITAMIN D3) tablet 1,000 Units  1,000 Units Oral Daily Hessie Knows, MD   1,000 Units at 05/14/20 0924  . dextromethorphan-guaiFENesin (MUCINEX DM) 30-600 MG per 12 hr tablet 1 tablet  1 tablet Oral BID PRN Hessie Knows, MD      . docusate sodium (COLACE) capsule 100 mg  100 mg Oral BID Hessie Knows, MD    100 mg at 05/14/20 0924  . enoxaparin (LOVENOX) injection 40 mg  40 mg Subcutaneous Q24H Hessie Knows, MD   40 mg at 05/14/20 0926  . escitalopram (LEXAPRO) tablet 5 mg  5 mg Oral Daily Hessie Knows, MD   5 mg at 05/14/20 0927  . ezetimibe (ZETIA) tablet 10 mg  10 mg Oral Daily Hessie Knows, MD   10 mg at 05/14/20 0927  . famotidine (PEPCID) tablet 20 mg  20 mg Oral BID Hessie Knows, MD   20 mg at 05/14/20 0924  . feeding supplement (GLUCERNA SHAKE) (GLUCERNA SHAKE) liquid 237 mL  237 mL Oral TID BM Hessie Knows, MD   237 mL at 05/14/20 0928  . ferrous [id number] C-folic acid (TRINSICON / FOLTRIN) capsule 1 capsule  1 capsule Oral BID PC Hessie Knows, MD   1 capsule at 05/14/20 1108  . insulin aspart (novoLOG) injection 0-5 Units  0-5 Units Subcutaneous QHS Hessie Knows, MD      . insulin aspart (novoLOG) injection 0-9 Units  0-9 Units Subcutaneous TID WC Hessie Knows, MD      . loratadine (CLARITIN) tablet 10 mg  10 mg Oral Daily Hessie Knows, MD   10 mg at 05/14/20 0928  . magnesium citrate solution 1 Bottle  1 Bottle Oral Once PRN Hessie Knows, MD      . magnesium hydroxide (MILK OF MAGNESIA) suspension 30 mL  30 mL Oral Daily PRN Hessie Knows, MD      . menthol-cetylpyridinium (CEPACOL) lozenge 3 mg  1 lozenge Oral PRN Hessie Knows, MD       Or  . phenol (CHLORASEPTIC) mouth spray 1 spray  1 spray Mouth/Throat PRN Hessie Knows, MD      . methocarbamol (ROBAXIN) tablet 500 mg  500 mg Oral Q8H PRN Hessie Knows, MD      . metoCLOPramide (REGLAN) tablet 5-10 mg  5-10 mg Oral Q8H PRN Hessie Knows, MD       Or  . metoCLOPramide (REGLAN) injection 5-10 mg  5-10 mg Intravenous Q8H PRN Hessie Knows, MD      . morphine 2 MG/ML injection 1 mg  1 mg Intravenous Q2H PRN Hessie Knows, MD   1 mg at 05/13/20 1407  . multivitamin with minerals tablet 1 tablet  1 tablet Oral Daily Hessie Knows, MD   1 tablet at 05/14/20 [phone]  . ondansetron (ZOFRAN) injection 4 mg  4 mg Intravenous  Q6H PRN Hessie Knows, MD   4 mg at 05/12/20 1741  . ondansetron (ZOFRAN) tablet 4 mg  4 mg Oral Q6H PRN Hessie Knows, MD       Or  . ondansetron [facility]) injection 4 mg  4 mg Intravenous Q6H PRN Hessie Knows, MD      . oxyCODONE-acetaminophen (PERCOCET/ROXICET) 5-325 MG per tablet 1 tablet  1 tablet Oral Q4H PRN Hessie Knows, MD   1 tablet at 05/14/20 0434  . senna-docusate (Senokot-S) tablet 1 tablet  1 tablet Oral QHS PRN Hessie Knows, MD         Discharge Medications: Please see discharge summary for a list of discharge medications.  Relevant Imaging Results:  Relevant Lab Results:   Additional Information SS# [id number]  Shelbie Ammons, RN

## 2020-05-14 NOTE — Anesthesia Postprocedure Evaluation (Signed)
Anesthesia Post Note  Patient: Sarah Velasquez  Procedure(s) Performed: INTRAMEDULLARY (IM) NAIL INTERTROCHANTRIC (Left )  Patient location during evaluation: Nursing Unit Anesthesia Type: Spinal Level of consciousness: awake, oriented and awake and alert Pain management: pain level controlled Vital Signs Assessment: post-procedure vital signs reviewed and stable Respiratory status: spontaneous breathing, respiratory function stable and nonlabored ventilation Cardiovascular status: blood pressure returned to baseline and stable Postop Assessment: no headache and no backache Anesthetic complications: no   No complications documented.   Last Vitals:  Vitals:   05/14/20 0412 05/14/20 0820  BP: (!) 137/58 (!) 87/41  Pulse: 88 77  Resp:  16  Temp: 37.1 C 36.9 C  SpO2: 97% 99%    Last Pain:  Vitals:   05/14/20 0820  TempSrc: Oral  PainSc:                  Johnna Acosta

## 2020-05-14 NOTE — Evaluation (Signed)
Physical Therapy Evaluation Patient Details Name: Sarah Velasquez MRN: [id number] DOB: [birth year] Today's Date: 05/14/2020   History of Present Illness  Sarah Velasquez is a 89yoF who comes to [facility] 9/8 from home after a fall. Pt c/o Lt hip and shoulder pain. Imaging reveals Left hip fracture, left shoulder fracture/dislocation. Pt underwent Left femur ORIF on 9/9 c Dr. Rudene Christians c IM nailing. LLE now WBAT. 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  Clinical Impression  Pt admitted with above diagnosis. Pt currently with functional limitations due to the deficits listed below (see "PT Problem List"). Upon entry, pt in bed, easily made awake and agreeable to participate. DTR in room provides background information due to pt's baseline cognitive/memory recall deficits.  The pt is alert and oriented to self, has obvious short term memory difficulty in session, repetitive interactions. Pt largely pleasant, conversational, and does well with multimodal cues, but struggles to attend to task for longer than 5-10 seconds. ModA to come to EOB with RUE only, min-modA for STS transfers without HHA or with HW on Right. Pt struggles with AMB unassisted due to pain, but with HW and close supervision, pt able to slowly AMB ~57ft in a controlled and confident manner. Functional mobility assessment demonstrates increased effort/time requirements, fair tolerance, and absolute need for physical assistance, as well as close supervision due to difficult attending to limb restrictions, whereas the patient performed these at a higher level of independence PTA. Pt will benefit from skilled PT intervention to increase independence and safety with basic mobility in preparation for discharge to the venue listed below.       Follow Up Recommendations SNF;Supervision/Assistance - 24 hour;Supervision for mobility/OOB    Equipment Recommendations  Other (comment) (defer to  faciliaty)    Recommendations for Other Services       Precautions / Restrictions Precautions Precautions: Fall Restrictions Weight Bearing Restrictions: Yes LUE Weight Bearing: Non weight bearing LLE Weight Bearing: Weight bearing as tolerated      Mobility  Bed Mobility Overal bed mobility: Needs Assistance Bed Mobility: Supine to Sit     Supine to sit: Mod assist     General bed mobility comments: heavy cues for LUE avoidance;  Transfers Overall transfer level: Needs assistance Equipment used: Hemi-walker;1 person hand held assist Transfers: Sit to/from Stand Sit to Stand: Min assist;From elevated surface         General transfer comment: needs more assist with HW transfer due to unfamiliarity  Ambulation/Gait   Gait Distance (Feet): 8 Feet Assistive device: Hemi-walker Gait Pattern/deviations: Step-to pattern     General Gait Details: 3-point step-to gait  Stairs            Wheelchair Mobility    Modified Rankin (Stroke Patients Only)       Balance Overall balance assessment: Needs assistance Sitting-balance support: Feet supported Sitting balance-Leahy Scale: Good     Standing balance support: Single extremity supported Standing balance-Leahy Scale: Fair Standing balance comment: pain prohibits tolerated loading                             Pertinent Vitals/Pain Pain Assessment: No/denies pain    Home Living Family/patient expects to be discharged to:: Private residence Living Arrangements: Spouse/significant other (pt provides care to demented husband) Available Help at Discharge: Family;Personal care attendant (in-home aide 8-1 7d/w) Type of Home: House Home Access: Elevator  Home Layout: One level Home Equipment: None      Prior Function Level of Independence: Independent         Comments: independent c mobility, no device; typically no balance issues at baseline; Son checks in daily.     Hand  Dominance   Dominant Hand: Right    Extremity/Trunk Assessment   Upper Extremity Assessment Upper Extremity Assessment: LUE deficits/detail;Overall WFL for tasks assessed LUE: Unable to fully assess due to immobilization         Cervical / Trunk Assessment Cervical / Trunk Assessment: Normal  Communication      Cognition Arousal/Alertness: Awake/alert Behavior During Therapy: WFL for tasks assessed/performed Overall Cognitive Status: History of cognitive impairments - at baseline                                        General Comments      Exercises     Assessment/Plan    PT Assessment Patient needs continued PT services  PT Problem List Decreased strength;Decreased cognition;Decreased range of motion;Decreased activity tolerance;Decreased balance;Decreased mobility;Decreased coordination;Decreased knowledge of use of DME;Decreased knowledge of precautions       PT Treatment Interventions DME instruction;Gait training;Stair training;Functional mobility training;Therapeutic activities;Therapeutic exercise;Balance training;Patient/family education    PT Goals (Current goals can be found in the Care Plan section)  Acute Rehab PT Goals Patient Stated Goal: return to independent walking PT Goal Formulation: With family Time For Goal Achievement: 05/28/20 Potential to Achieve Goals: Fair    Frequency BID   Barriers to discharge Inaccessible home environment;Decreased caregiver support 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 : A Lot 6 Click Score: 13    End of Session Equipment Utilized During Treatment: Gait belt Activity Tolerance: Patient tolerated treatment well;Patient limited by pain Patient left: in chair;with family/visitor present;with call bell/phone within reach;with chair alarm set Nurse Communication: Mobility status PT Visit Diagnosis: Unsteadiness on feet (R26.81);Other abnormalities of gait and mobility (R26.89);Muscle weakness (generalized) (M62.81);Difficulty in walking, not elsewhere classified (R26.2)    Time: 6761-9509 PT Time Calculation (min) (ACUTE ONLY): 31 min   Charges:   PT Evaluation $PT Eval Moderate Complexity: 1 Mod PT Treatments $Gait Training: 8-22 mins        2:29 PM, 05/14/20 Etta Grandchild, PT, DPT Physical Therapist - [facility]  [phone] ([place])    [place] C 05/14/2020, 2:25 PM

## 2020-05-14 NOTE — Progress Notes (Addendum)
PROGRESS NOTE    Sarah Velasquez  ZOX:[id number] DOB: 1945-10-25 DOA: 05/12/2020 PCP: Dion Body, MD   Chief complaint.  Hip pain. Brief Narrative:  Sarah Velasquez a 74 y.o.femalewith medical history significant ofhypertension, diabetes mellitus, GERD, depression, anxiety, vertigo, chronic bronchitis, who presents with fall and pain in left hip and shoulder. Patient had accidental fall at home, Sarah Velasquez sustained a left hip fracture.  Scheduled for surgery by orthopedics.  9/9.  Hip surgery with intramedullary nail performed.  9/10.  Patient has transient hypotension, give fluid bolus.  Very confused this morning, no agitation.   Assessment & Plan:   Principal Problem:   Closed intertrochanteric fracture of hip, left, initial encounter [facility]) Active Problems:   Mixed hyperlipidemia   GERD   Chronic bronchitis ([place])   Fall at home, initial encounter   Closed fracture dislocation of left shoulder   Diabetes mellitus without complication ([place])  #1.  Left hip fracture. Had a surgery yesterday.  Pain under control.  2.  Delirium with baseline Alzheimer dementia. Could be from low blood pressure this morning.  Give fluid bolus.  Continue to monitor.  Also send for UA.  3.  Hypotension. Likely secondary to dehydration.  Hemoglobin 9.1.  Will give IV fluid bolus with normal saline 500 mL.  Continue monitor.  4.  Acute blood loss anemia. Continue to follow.  Transfuse as needed.  5.  Chronic bronchitis. Stable.  6.  Type 2 diabetes. Continue sliding scale insulin.  7. Hypokalemia. Supplemented.    DVT prophylaxis: Lovenox Code Status: Partial code Family Communication: As per the patient and son.  .   Status is: Inpatient  Remains inpatient appropriate because:Inpatient level of care appropriate due to severity of illness   Dispo: The patient is from: Home              Anticipated d/c is to: SNF              Anticipated d/c date is: 2 days              Patient  currently is not medically stable to d/c.        I/O last 3 completed shifts: In: 8119 [I.V.:1000; IV Piggyback:50] Out: 1400 [Urine:1350; Blood:50] No intake/output data recorded.     Consultants:   Orthopedics.  Procedures: Hip Surgery. Antimicrobials:None  Subjective: Sarah Velasquez is very confused this morning, she did not know she was in the hospital.  Discussed with patient son, patient has mild dementia diagnosed recently. Her blood pressure has been low, give him fluid bolus. Denies any short of breath or cough. No diarrhea or nausea vomiting. Deny any dysuria hematuria pain No fever chills. Did not feel any dizziness.  Objective: Vitals:   05/13/20 2314 05/13/20 2356 05/14/20 0412 05/14/20 0820  BP: (!) 127/53 (!) 109/49 (!) 137/58 (!) 87/41  Pulse: 86 85 88 77  Resp:  16  16  Temp: 99 F (37.2 C) 98.3 F (36.8 C) 98.8 F (37.1 C) 98.4 F (36.9 C)  TempSrc: Oral Oral Oral Oral  SpO2: 94% 96% 97% 99%  Weight:      Height:        Intake/Output Summary (Last 24 hours) at 05/14/2020 0943 Last data filed at 05/14/2020 0619 Gross per 24 hour  Intake 1050 ml  Output 950 ml  Net 100 ml   Filed Weights   05/12/20 1201  Weight: 67.1 kg    Examination:  General exam: Appears calm and comfortable  Respiratory system: Clear to auscultation. Respiratory effort normal. Cardiovascular system: S1 & S2 heard, RRR. No JVD, murmurs, rubs, gallops or clicks. No pedal edema. Gastrointestinal system: Abdomen is nondistended, soft and nontender. No organomegaly or masses felt. Normal bowel sounds heard. Central nervous system: Woke up very confused, oriented to herself. No focal neurological deficits. Extremities: Symmetric  Skin: No rashes, lesions or ulcers     Data Reviewed: I have personally reviewed following labs and imaging studies  CBC: Recent Labs  Lab 05/12/20 1908 05/13/20 0554 05/14/20 0515  WBC 17.4* 12.0* 11.2*  NEUTROABS 15.6*  --   --   HGB 12.2  11.7* 9.1*  HCT 36.2 33.2* 26.4*  MCV 88.1 85.1 88.9  PLT 234 216 709   Basic Metabolic Panel: Recent Labs  Lab 05/12/20 1908 05/13/20 0554 05/14/20 0515  NA 142 139 139  K 4.1 4.1 3.2*  CL 107 103 107  CO2 22 23 19*  GLUCOSE 170* 118* 128*  BUN 21 20 13   CREATININE 0.90 0.92 0.77  CALCIUM 9.6 9.3 8.1*   GFR: Estimated Creatinine Clearance: 55.5 mL/min (by C-G formula based on SCr of 0.77 mg/dL). Liver Function Tests: No results for input(s): AST, ALT, ALKPHOS, BILITOT, PROT, ALBUMIN in the last 168 hours. No results for input(s): LIPASE, AMYLASE in the last 168 hours. No results for input(s): AMMONIA in the last 168 hours. Coagulation Profile: Recent Labs  Lab 05/12/20 1908  INR 1.1   Cardiac Enzymes: No results for input(s): CKTOTAL, CKMB, CKMBINDEX, TROPONINI in the last 168 hours. BNP (last 3 results) No results for input(s): PROBNP in the last 8760 hours. HbA1C: No results for input(s): HGBA1C in the last 72 hours. CBG: Recent Labs  Lab 05/13/20 0758 05/13/20 1129 05/13/20 1712 05/13/20 2217 05/14/20 0820  GLUCAP 108* 114* 111* 105* 101*   Lipid Profile: No results for input(s): CHOL, HDL, LDLCALC, TRIG, CHOLHDL, LDLDIRECT in the last 72 hours. Thyroid Function Tests: No results for input(s): TSH, T4TOTAL, FREET4, T3FREE, THYROIDAB in the last 72 hours. Anemia Panel: No results for input(s): VITAMINB12, FOLATE, FERRITIN, TIBC, IRON, RETICCTPCT in the last 72 hours. Sepsis Labs: No results for input(s): PROCALCITON, LATICACIDVEN in the last 168 hours.  Recent Results (from the past 240 hour(s))  SARS Coronavirus 2 by RT PCR (hospital order, performed in [facility] hospital lab) Nasopharyngeal Nasopharyngeal Swab     Status: None   Collection Time: 05/12/20  3:58 PM   Specimen: Nasopharyngeal Swab  Result Value Ref Range Status   SARS Coronavirus 2 NEGATIVE NEGATIVE Final    Comment: (NOTE) SARS-CoV-2 target nucleic acids are NOT DETECTED.  The  SARS-CoV-2 RNA is generally detectable in upper and lower respiratory specimens during the acute phase of infection. The lowest concentration of SARS-CoV-2 viral copies this assay can detect is 250 copies / mL. A negative result does not preclude SARS-CoV-2 infection and should not be used as the sole basis for treatment or other patient management decisions.  A negative result may occur with improper specimen collection / handling, submission of specimen other than nasopharyngeal swab, presence of viral mutation(s) within the areas targeted by this assay, and inadequate number of viral copies (<250 copies / mL). A negative result must be combined with clinical observations, patient history, and epidemiological information.  Fact Sheet for Patients:   StrictlyIdeas.no  Fact Sheet for Healthcare Providers: BankingDealers.co.za  This test is not yet approved or  cleared by the [place] FDA and has been authorized for detection  and/or diagnosis of SARS-CoV-2 by FDA under an Emergency Use Authorization (EUA).  This EUA will remain in effect (meaning this test can be used) for the duration of the COVID-19 declaration under Section 564(b)(1) of the Act, 21 U.S.C. section 360bbb-3(b)(1), unless the authorization is terminated or revoked sooner.  Performed at [facility], [address] [place], [place] [address]   MRSA PCR Screening     Status: None   Collection Time: 05/13/20  7:34 AM   Specimen: Nasopharyngeal  Result Value Ref Range Status   MRSA by PCR NEGATIVE NEGATIVE Final    Comment:        The GeneXpert MRSA Assay (FDA approved for NASAL specimens only), is one component of a comprehensive MRSA colonization surveillance program. It is not intended to diagnose MRSA infection nor to guide or monitor treatment for MRSA infections. Performed at [facility], [address] [place], [place] [address]            Radiology Studies: DG Chest 1 View  Result Date: 05/12/2020 CLINICAL DATA:  Syncopal episode. Fell and injured left shoulder and left hip. EXAM: CHEST  1 VIEW COMPARISON:  Chest x-ray 07/12/2015 FINDINGS: The cardiac silhouette, mediastinal and hilar contours are within normal limits and stable. The lungs are clear of an acute process. No pleural effusion or pneumothorax. Fracture dislocation noted involving the left shoulder. The right shoulder is intact. No definite rib fractures. IMPRESSION: 1. No acute cardiopulmonary findings. 2. Left shoulder fracture dislocation. Electronically Signed   By: Marijo Sanes M.D.   On: 05/12/2020 12:57   CT HEAD WO CONTRAST  Result Date: 05/12/2020 CLINICAL DATA:  Syncope with fall EXAM: CT HEAD WITHOUT CONTRAST TECHNIQUE: Contiguous axial images were obtained from the base of the skull through the vertex without intravenous contrast. COMPARISON:  None. FINDINGS: Brain: There is age related volume loss. There is no intracranial mass, hemorrhage, extra-axial fluid collection, or midline shift. There is slight small vessel disease in the centra semiovale bilaterally. No acute infarct evident. Vascular: No hyperdense vessel. There is mild calcification in the carotid siphon regions bilaterally. Skull: Bony calvarium appears intact. Sinuses/Orbits: There is mucosal thickening in several ethmoid air cells. Other paranasal sinuses are clear. Orbits appear symmetric bilaterally. Other: Mastoid air cells are clear. IMPRESSION: Age related volume loss with slight periventricular small vessel disease. No acute infarct. No mass or hemorrhage. Foci of arterial vascular calcification noted. Mucosal thickening noted in several ethmoid air cells. Electronically Signed   By: Lowella Grip III M.D.   On: 05/12/2020 14:02   DG Shoulder Left  Result Date: 05/12/2020 CLINICAL DATA:  Fall. EXAM: LEFT SHOULDER - 2+ VIEW COMPARISON:  None. FINDINGS: Anterior dislocation of  the humeral head with displaced posterior greater tuberosity fracture fragment. Mild degenerative changes of the acromioclavicular joint. Soft tissues are unremarkable. IMPRESSION: 1. Anterior shoulder dislocation with displaced posterior greater tuberosity fracture fragment. Electronically Signed   By: Titus [place]in M.D.   On: 05/12/2020 12:57   DG HIP OPERATIVE UNILAT W OR W/O PELVIS LEFT  Result Date: 05/13/2020 CLINICAL DATA:  LEFT hip surgery EXAM: OPERATIVE LEFT HIP (WITH PELVIS IF PERFORMED) 4 VIEWS TECHNIQUE: Fluoroscopic spot image(s) were submitted for interpretation post-operatively. COMPARISON:  05/12/2020 FLUOROSCOPY TIME:  0 minutes 49.5 seconds Images: 4 Dose: 4.8664 mGy FINDINGS: Images demonstrate placement of an IM nail with proximal compression screw in LEFT femur post ORIF of an intertrochanteric fracture. Displaced lesser trochanteric fracture fragment again seen. Distal locking screw  placed. Bones demineralized. IMPRESSION: Post ORIF of previously identified intertrochanteric fracture LEFT femur. Electronically Signed   By: Lavonia Dana M.D.   On: 05/13/2020 16:57   DG Hip Unilat W or Wo Pelvis 2-3 Views Left  Result Date: 05/12/2020 CLINICAL DATA:  Left hip in a fall secondary pain due to an injury suffered to syncope today. Initial encounter. EXAM: DG HIP (WITH OR WITHOUT PELVIS) 2-3V LEFT COMPARISON:  None. FINDINGS: The patient has an acute left intertrochanteric fracture. The lesser trochanter is a separate fragment. No other acute abnormality is identified. Lower lumbar spondylosis noted. IMPRESSION: Acute left intertrochanteric fracture. Electronically Signed   By: Inge Rise M.D.   On: 05/12/2020 12:57        Scheduled Meds: . aspirin EC  81 mg Oral Daily  . atorvastatin  80 mg Oral Daily  . cholecalciferol  1,000 Units Oral Daily  . docusate sodium  100 mg Oral BID  . enoxaparin (LOVENOX) injection  40 mg Subcutaneous Q24H  . escitalopram  5 mg Oral Daily  .  ezetimibe  10 mg Oral Daily  . famotidine  20 mg Oral BID  . feeding supplement (GLUCERNA SHAKE)  237 mL Oral TID BM  . ferrous [id number] C-folic acid  1 capsule Oral BID PC  . insulin aspart  0-5 Units Subcutaneous QHS  . insulin aspart  0-9 Units Subcutaneous TID WC  . loratadine  10 mg Oral Daily  . multivitamin with minerals  1 tablet Oral Daily  . potassium chloride  40 mEq Oral Q2H   Continuous Infusions: . sodium chloride 75 mL/hr at 05/14/20 0937  .  ceFAZolin (ANCEF) IV 1 g (05/14/20 0939)  . sodium chloride       LOS: 2 days    Time spent: 36 minutes    Sharen Hones, MD [facility]   To contact the attending provider between 7A-7P or the covering provider during after hours 7P-7A, please log into the web site [email or web address] and access using universal [place] password for that web site. If you do not have the password, please call the hospital operator.  05/14/2020, 9:43 AM

## 2020-05-14 NOTE — Progress Notes (Signed)
Subjective: 1 Day Post-Op Procedure(s) (LRB): INTRAMEDULLARY (IM) NAIL INTERTROCHANTRIC (Left) Patient reports pain as mild.   Patient is well, and has had no acute complaints or problems.  Family member with patient this morning, notes she has not slept in 2 nights.  Patient with underlying dementia that is currently more severe than baseline Denies any CP, SOB, ABD pain. We will continue therapy today.  Plan is to go Skilled nursing facility after hospital stay.  Objective: Vital signs in last 24 hours: Temp:  [98.1 F (36.7 C)-99 F (37.2 C)] 98.8 F (37.1 C) (09/10 0412) Pulse Rate:  [39-88] 88 (09/10 0412) Resp:  [12-20] 16 (09/09 2356) BP: (89-149)/(36-89) 137/58 (09/10 0412) SpO2:  [72 %-100 %] 97 % (09/10 0412)  Intake/Output from previous day: 09/09 0701 - 09/10 0700 In: 1050 [I.V.:1000; IV Piggyback:50] Out: 950 [Urine:900; Blood:50] Intake/Output this shift: No intake/output data recorded.  Recent Labs    05/12/20 1908 05/13/20 0554 05/14/20 0515  HGB 12.2 11.7* 9.1*   Recent Labs    05/13/20 0554 05/14/20 0515  WBC 12.0* 11.2*  RBC 3.90 2.97*  HCT 33.2* 26.4*  PLT 216 162   Recent Labs    05/13/20 0554 05/14/20 0515  NA 139 139  K 4.1 3.2*  CL 103 107  CO2 23 19*  BUN 20 13  CREATININE 0.92 0.77  GLUCOSE 118* 128*  CALCIUM 9.3 8.1*   Recent Labs    05/12/20 1908  INR 1.1    EXAM General - Patient is Alert and Confused  Left upper extremity -minimal swelling throughout the left upper extremity.  Neurovascular intact.  No deformity Left lower extremity - Neurovascular intact Sensation intact distally Intact pulses distally Dorsiflexion/Plantar flexion intact No cellulitis present Compartment soft Dressing - dressing C/D/I and no drainage Motor Function - intact, moving foot and toes well on exam.   Past Medical History:  Diagnosis Date   Anxiety    Chest pain 02/15-16/2009   Hospital [facility]  chest pain rule out //stress  Myoview negative 10/21/2007//Stress cardiolite ;apical thinning ,negative ischemia (09/14/2003)   Diabetes mellitus    typeII   Diverticulosis of colon 09/2003   colonoscopy ext.and internal hemorrhoids (Dr. Vira Agar )12/03/2006// colonoscopy polyps multiple  benign; interal hemorrhoids ,divertics 09/16/2003   Drug-induced constipation    Environmental allergies    GERD (gastroesophageal reflux disease)    EGD hiatal hernia; esophagitis/sigmoidoscopy WNL (Dr. Vira Agar) 03/04/1996: ABD. U/S  WNL (06/1996)//EGD multiple polyps ;small H.H (09/16/2003)   H/O hiatal hernia    Hyperlipidemia 05/1995   Osteopenia 12/2011   spine -1.8, hip -0.7   PONV (postoperative nausea and vomiting)    Right lumbar radiculitis 2013   DDD, spondylosis, L3 radiculitis (MRI 07/2012) - ESI 09/2011 (Dr Sharlet Salina)    Vertigo    hx of    Assessment/Plan:   1 Day Post-Op Procedure(s) (LRB): INTRAMEDULLARY (IM) NAIL INTERTROCHANTRIC (Left) Principal Problem:   Closed intertrochanteric fracture of hip, left, initial encounter [facility]) Active Problems:   Mixed hyperlipidemia   GERD   Chronic bronchitis ([place])   Fall at home, initial encounter   Closed fracture dislocation of left shoulder   Diabetes mellitus without complication ([place])  Estimated body mass index is 24.63 kg/m as calculated from the following:   Height as of this encounter: 5\' 5"  (1.651 m).   Weight as of this encounter: 67.1 kg. Advance diet Up with therapy, weightbearing as tolerated left lower extremity. Will work on bowel movement Labs and vital signs  are stable Hemoglobin 9.1, recheck labs in the morning Exacerbation of underlying dementia is likely secondary to anesthesia, medications and lack of sleep.  Internal medicine following, consider giving medications to help patient sleep.   Follow-up with [facility] orthopedics in 2 weeks for x-rays of the left shoulder and staple removal of the left hip TED hose bilateral lower extremity x6  weeks Lovenox 40 mg subcu daily x14 days    DVT Prophylaxis - Lovenox, TED hose and SCDs Weight-Bearing as tolerated to left leg   T. Rachelle Hora, PA-C [place] 05/14/2020, 8:08 AM

## 2020-05-14 NOTE — Progress Notes (Signed)
Patient has been confused throughout the night. Patient started having hallucinations around 0127. NP notified.

## 2020-05-15 ENCOUNTER — Inpatient Hospital Stay: Payer: PPO

## 2020-05-15 DIAGNOSIS — D62 Acute posthemorrhagic anemia: Secondary | ICD-10-CM

## 2020-05-15 LAB — BASIC METABOLIC PANEL
Anion gap: 9 (ref 5–15)
BUN: 10 mg/dL (ref 8–23)
CO2: 22 mmol/L (ref 22–32)
Calcium: 8.3 mg/dL — ABNORMAL LOW (ref 8.9–10.3)
Chloride: 108 mmol/L (ref 98–111)
Creatinine, Ser: 0.64 mg/dL (ref 0.44–1.00)
GFR calc Af Amer: >60 mL/min (ref >60)
GFR calc non Af Amer: >60 mL/min (ref >60)
Glucose, Bld: 113 mg/dL — ABNORMAL HIGH (ref 70–99)
Potassium: 3.9 mmol/L (ref 3.5–5.1)
Sodium: 139 mmol/L (ref 135–145)

## 2020-05-15 LAB — CBC WITH DIFFERENTIAL/PLATELET
Abs Immature Granulocytes: 0.05 10*3/uL (ref 0.00–0.07)
Basophils Absolute: 0.1 10*3/uL (ref 0.0–0.1)
Basophils Relative: 1 %
Eosinophils Absolute: 0.3 10*3/uL (ref 0.0–0.5)
Eosinophils Relative: 4 %
HCT: 24.1 % — ABNORMAL LOW (ref 36.0–46.0)
Hemoglobin: 8.3 g/dL — ABNORMAL LOW (ref 12.0–15.0)
Immature Granulocytes: 1 %
Lymphocytes Relative: 22 %
Lymphs Abs: 1.8 10*3/uL (ref 0.7–4.0)
MCH: 30.3 pg (ref 26.0–34.0)
MCHC: 34.4 g/dL (ref 30.0–36.0)
MCV: 88 fL (ref 80.0–100.0)
Monocytes Absolute: 0.6 10*3/uL (ref 0.1–1.0)
Monocytes Relative: 8 %
Neutro Abs: 5.5 10*3/uL (ref 1.7–7.7)
Neutrophils Relative %: 64 %
Platelets: 160 10*3/uL (ref 150–400)
RBC: 2.74 MIL/uL — ABNORMAL LOW (ref 3.87–5.11)
RDW: 13.8 % (ref 11.5–15.5)
WBC: 8.3 10*3/uL (ref 4.0–10.5)
nRBC: 0 % (ref 0.0–0.2)

## 2020-05-15 LAB — IRON AND TIBC
Iron: 18 ug/dL — ABNORMAL LOW (ref 28–170)
Saturation Ratios: 8 % — ABNORMAL LOW (ref 10.4–31.8)
TIBC: 214 ug/dL — ABNORMAL LOW (ref 250–450)
UIBC: 196 ug/dL

## 2020-05-15 LAB — GLUCOSE, CAPILLARY
Glucose-Capillary: 103 mg/dL — ABNORMAL HIGH (ref 70–99)
Glucose-Capillary: 156 mg/dL — ABNORMAL HIGH (ref 70–99)
Glucose-Capillary: 94 mg/dL (ref 70–99)
Glucose-Capillary: 137 mg/dL — ABNORMAL HIGH (ref 70–99)

## 2020-05-15 LAB — MAGNESIUM: Magnesium: 1.8 mg/dL (ref 1.7–2.4)

## 2020-05-15 MED ORDER — HALOPERIDOL 1 MG PO TABS
1.0000 mg | ORAL_TABLET | Freq: Three times a day (TID) | ORAL | Status: DC | PRN
  Administered 2020-05-15 – 2020-05-16 (×2): 1 mg via ORAL
  Filled 2020-05-15 (×3): qty 1

## 2020-05-15 NOTE — Progress Notes (Signed)
Pt very anxious and slightly agitated with daughter at bedside.  Pt feels family is plotting against her to separate her and her husband.  This is not the case.  Nurse text the Dr for orders, he placed order for PRN dose of PO haldol 1mg .  This was just administered.  Nurse sat and spoke with pt for a bit, she was tearful but easy to redirect and calm.

## 2020-05-15 NOTE — Progress Notes (Addendum)
Physical Therapy Treatment Patient Details Name: Sarah Velasquez MRN: [id number] DOB: 08-02-1946 Today's Date: 05/15/2020    History of Present Illness Sarah Velasquez is a 58yoF who comes to [facility] 9/8 from home after a fall. Pt c/o Lt hip and shoulder pain. Imaging reveals Left hip fracture, left shoulder fracture/dislocation. Pt underwent Left femur ORIF on 9/9 c Dr. Rudene Christians c IM nailing. LLE now WBAT. 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    PT Comments    Pt was long sitting in bed with daughter present at bedside upon arriving. She is A but disoriented. Is oriented to self but incorrectly states location, day, and UE precautions/restrictions. Per daughter, just recently diagnosed with dementia. She request to use BR. BP at rest 108/71. Was able to ambulate to BR and urinated however has syncope episode that required max assist to stand pivot to recliner to elevated feet. Pt profusely sweating and reports feeling dizzy. BP 97/54. After ~ 5 minutes sitting in recliner, pt reports no symptoms and request to have BM. BP returned to 116/72 prior to stand pivot to/from [facility]. Had successful BM priro to returning to chair. BLEs elevated, chair alarm in place, and daughter at bedside.  RN aware of near syncope episode and safety concerns. Will return later this afternoon per POC.   Follow Up Recommendations  SNF;Supervision/Assistance - 24 hour;Supervision for mobility/OOB     Equipment Recommendations  Other (comment) (defer to next level of care)    Recommendations for Other Services       Precautions / Restrictions Precautions Precautions: Fall Restrictions Weight Bearing Restrictions: Yes LUE Weight Bearing: Non weight bearing LLE Weight Bearing: Weight bearing as tolerated    Mobility  Bed Mobility Overal bed mobility: Needs Assistance Bed Mobility: Supine to Sit     Supine to sit: Mod assist     General bed  mobility comments: Mod assist to progress to sitting on EOB from long sitting. No reports of dizziness upon sitting up EOB. BP while in bed 108/71  Transfers Overall transfer level: Needs assistance Equipment used: Hemi-walker Transfers: Sit to/from Stand Sit to Stand: Min assist;Mod assist         General transfer comment: pt was able to stand at EOB, from [facility], and from recliner during session with min assist from elevated surfaces but mod assist from lower. Pt has hypotensive event while sitting on toilet that required max assist to stand pivot to recliner so BLEs could be eleveated . BP 97/54. recovers quickly and RN notified. pt then requested to get onto [facility] to have BM. she stood pivot to [facility] without drop in BP. has successful BM and then returned to recliner.   Ambulation/Gait Ambulation/Gait assistance: Min assist Gait Distance (Feet): 12 Feet Assistive device: Hemi-walker Gait Pattern/deviations: Step-to pattern Gait velocity: decreased   General Gait Details: 3-point step-to gait. MAX vcs for improved sequencing. may benefit from trying [facility] for hemiwalker trips her up      Balance Overall balance assessment: Needs assistance Sitting-balance support: Feet supported Sitting balance-Leahy Scale: Good Sitting balance - Comments: no LOB or unsteadibness while seated EOB    Standing balance support: Single extremity supported;During functional activity Standing balance-Leahy Scale: Fair Standing balance comment: pt is at high fall risk          Cognition Arousal/Alertness: Awake/alert Behavior During Therapy: WFL for tasks assessed/performed Overall Cognitive Status: History of cognitive impairments - at baseline (per daughter"  newly dx dementia")        General Comments: Pt is A and cooperative however only oriented to self. reports she is at [employer] and  in [employer]. did know year but unaware of day month.      Exercises Other Exercises Other Exercises: Pt and  family educated re: OT role, DME recs, d/c recs, falls prevention, pain mgmt strategies, ECS, functional application of LUE NWBing pcns Other Exercises: LBD, don shoulder sling, sitting balance/tolerance, scooting and lateral leans in chair, self-drinking        Pertinent Vitals/Pain Pain Assessment: Faces Faces Pain Scale: Hurts little more Pain Location: L hip Pain Descriptors / Indicators: Discomfort;Grimacing;Operative site guarding Pain Intervention(s): Limited activity within patient's tolerance;Repositioned;Patient requesting pain meds-RN notified    Home Living Family/patient expects to be discharged to:: Private residence Living Arrangements: Spouse/significant other (pt is caregiver for demented husband ) Available Help at Discharge: Family;Personal care attendant (in-home aide 8-1 7d/w) Type of Home: House Home Access: Elevator   Home Layout: One level Home Equipment: None      Prior Function Level of Independence: Independent          PT Goals (current goals can now be found in the care plan section) Acute Rehab PT Goals Patient Stated Goal: return home to care for husband Progress towards PT goals: Progressing toward goals    Frequency    BID      PT Plan Current plan remains appropriate       AM-PAC PT "6 Clicks" Mobility   Outcome Measure  Help needed turning from your back to your side while in a flat bed without using bedrails?: A Lot Help needed moving from lying on your back to sitting on the side of a flat bed without using bedrails?: A Lot Help needed moving to and from a bed to a chair (including a wheelchair)?: A Lot Help needed standing up from a chair using your arms (e.g., wheelchair or bedside chair)?: A Lot Help needed to walk in hospital room?: A Little Help needed climbing 3-5 steps with a railing? : A Lot 6 Click Score: 13    End of Session Equipment Utilized During Treatment: Gait belt Activity Tolerance: Patient tolerated  treatment well Patient left: in chair;with family/visitor present;with call bell/phone within reach;with chair alarm set Nurse Communication: Mobility status PT Visit Diagnosis: Unsteadiness on feet (R26.81);Other abnormalities of gait and mobility (R26.89);Muscle weakness (generalized) (M62.81);Difficulty in walking, not elsewhere classified (R26.2)     Time: 5093-2671 PT Time Calculation (min) (ACUTE ONLY): 38 min  Charges:  $Gait Training: 8-22 mins $Therapeutic Activity: 23-37 mins                     Julaine Fusi PTA 05/15/20, 12:10 PM

## 2020-05-15 NOTE — Progress Notes (Signed)
Physical Therapy Treatment Patient Details Name: Sarah Velasquez MRN: [id number] DOB: Jul 19, 1946 Today's Date: 05/15/2020    History of Present Illness Sarah Velasquez is a 30yoF who comes to [facility] 9/8 from home after a fall. Pt c/o Lt hip and shoulder pain. Imaging reveals Left hip fracture, left shoulder fracture/dislocation. Pt underwent Left femur ORIF on 9/9 c Dr. Rudene Christians c IM nailing. LLE now WBAT. 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    PT Comments    Pt was sitting in recliner upon arriving. She agrees to PT session and is requesting to urinate then get back into bed. Resting BP 105/44 reporting no dizziness or feeling of lightheadedness. She stood and took several steps to [facility] using SPC. Then stood and took steps to EOB. Vcs throughout for safety. Did urinate 300 ml. Once seated EOB, pt required mod assist to progress BLEs into bed and be repositioned to [facility]. BP once in bed 94/65. RN notified. Acute PT will continue to follow per POC and progress as able per pt tolerance. Recommend DC to SNF to address deficits and improve safe functional mobility. At conclusion of session, pt was in bed with daughter at bedside and bed alarm in place.    Follow Up Recommendations  SNF;Supervision/Assistance - 24 hour;Supervision for mobility/OOB     Equipment Recommendations  Other (comment) (defer to next level of care)    Recommendations for Other Services       Precautions / Restrictions Precautions Precautions: Fall Restrictions Weight Bearing Restrictions: Yes LUE Weight Bearing: Non weight bearing LLE Weight Bearing: Weight bearing as tolerated    Mobility  Bed Mobility Overal bed mobility: Needs Assistance Bed Mobility: Sit to Supine     Supine to sit: Mod assist Sit to supine: Mod assist (2/2 to pain )   General bed mobility comments: mod assist to return to supine form sitting EOB. required assistance to slide up  to [facility].  Transfers Overall transfer level: Needs assistance Equipment used: Straight cane Transfers: Sit to/from Stand Sit to Stand: Min assist         General transfer comment: min assist to stand to [facility] with Vcs to not use LUE. Took steps to [facility] to urinate then took steps to EOB. elected not to ambulate this afternoon 2/2 to low BP. 94/65 after returning to bed. RN aware.  Ambulation/Gait Ambulation/Gait assistance: Min assist   Assistive device: Straight cane Gait Pattern/deviations: Step-to pattern Gait velocity: decreased   General Gait Details: pt was able to ambulate 3 ft 2 x this session. To BSC then to bed. did much better with SPC than with hemiwalker      Balance Overall balance assessment: Needs assistance Sitting-balance support: Feet supported Sitting balance-Leahy Scale: Good Sitting balance - Comments: no LOB in sitting   Standing balance support: Single extremity supported;During functional activity Standing balance-Leahy Scale: Fair Standing balance comment: pt slightly impulsive in standing.            Cognition Arousal/Alertness: Awake/alert Behavior During Therapy: WFL for tasks assessed/performed Overall Cognitive Status: History of cognitive impairments - at baseline        General Comments: Pt very cooperative. Required to return to bed. BP in recliner prior to transfer 105/44      Exercises Other Exercises Other Exercises: Pt and family educated re: OT role, DME recs, d/c recs, falls prevention, pain mgmt strategies, ECS, functional application of LUE NWBing pcns Other Exercises:  LBD, don shoulder sling, sitting balance/tolerance, scooting and lateral leans in chair, self-drinking        Pertinent Vitals/Pain Pain Assessment: No/denies pain Pain Score: 0-No pain Faces Pain Scale: No hurt Pain Location: L hip Pain Descriptors / Indicators: Discomfort;Grimacing;Operative site guarding Pain Intervention(s): Limited activity within  patient's tolerance;Monitored during session;Premedicated before session;Repositioned    Home Living Family/patient expects to be discharged to:: Private residence Living Arrangements: Spouse/significant other (pt is caregiver for demented husband ) Available Help at Discharge: Family;Personal care attendant (in-home aide 8-1 7d/w) Type of Home: House Home Access: Elevator   Home Layout: One level Home Equipment: None      Prior Function Level of Independence: Independent          PT Goals (current goals can now be found in the care plan section) Acute Rehab PT Goals Patient Stated Goal: return to PLOF Progress towards PT goals: Progressing toward goals    Frequency    BID      PT Plan Current plan remains appropriate       AM-PAC PT "6 Clicks" Mobility   Outcome Measure  Help needed turning from your back to your side while in a flat bed without using bedrails?: A Little Help needed moving from lying on your back to sitting on the side of a flat bed without using bedrails?: A Lot Help needed moving to and from a bed to a chair (including a wheelchair)?: A Lot Help needed standing up from a chair using your arms (e.g., wheelchair or bedside chair)?: A Lot Help needed to walk in hospital room?: A Lot Help needed climbing 3-5 steps with a railing? : A Lot 6 Click Score: 13    End of Session Equipment Utilized During Treatment: Gait belt Activity Tolerance: Patient tolerated treatment well Patient left: in bed;with call bell/phone within reach;with bed alarm set;with family/visitor present Nurse Communication: Mobility status PT Visit Diagnosis: Unsteadiness on feet (R26.81);Other abnormalities of gait and mobility (R26.89);Muscle weakness (generalized) (M62.81);Difficulty in walking, not elsewhere classified (R26.2)     Time: 6761-9509 PT Time Calculation (min) (ACUTE ONLY): 23 min  Julaine Fusi PTA 05/15/20, 2:25 PM

## 2020-05-15 NOTE — Plan of Care (Signed)
Proximal dressing removed, minimal sanguinous drainage noted.  Fresh honeycomb dressing applied proximally.

## 2020-05-15 NOTE — TOC Progression Note (Signed)
Transition of Care [facility]) - Progression Note    Patient Details  Name: Sarah Velasquez MRN: [id number] Date of Birth: [birth year]  [facility] of [facility] CM/SW Contact  Boris Sharper, LCSW Phone Number: 05/15/2020, 3:31 PM  Clinical Narrative:    CSW received a call from [facility] and they notified that pt;s EMS transport has been approved, approval number is [id number]. Pt cannot discharge to [facility] until Monday due to pharmacy being closed on weekends.       Expected Discharge Plan and Services                                                 Social Determinants of Health (SDOH) Interventions    Readmission Risk Interventions No flowsheet data found.

## 2020-05-15 NOTE — Evaluation (Signed)
Occupational Therapy Evaluation Patient Details Name: Sarah Velasquez MRN: [id number] DOB: [birth year] Today's Date: 05/15/2020    History of Present Illness Sarah Velasquez is a 33yoF who comes to [facility] 9/8 from home after a fall. Pt c/o Lt hip and shoulder pain. Imaging reveals Left hip fracture, left shoulder fracture/dislocation. Pt underwent Left femur ORIF on 9/9 c Dr. Rudene Christians c IM nailing. LLE now WBAT. 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   Clinical Impression   Sarah Velasquez was seen for OT evaluation this date. Prior to hospital admission, pt was Independent in mobility and ADLs. Pt lives c husband c dementia and is primary caregiver - has PCA 8-1 7d/w. Pt presents to acute OT demonstrating impaired ADL performance, functional cognition, and functional mobility 2/2 decreased safety awareness, functional strength/ROM deficits, decreased activity tolerance, and impaired [place] of dominant LUE. Pt currently requires SETUP self-drinking seated in chair. MAX A for LB access in sitting. MOD A don sling seated EOC. Attempted standing trial using HW and 1HHA however pt unable to clear rear - endorses pain significantly increased since PT session this AM. Further mobility deferred. Pt and daughter instructed in functional application of NWBing status, falls prevention, sling mgmt, adapted dressing techniques, and pain mgmt strategies. Pt would benefit from skilled OT to address noted impairments and functional limitations (see below for any additional details) in order to maximize safety and independence while minimizing falls risk and caregiver burden. Upon hospital discharge, recommend STR to maximize pt safety and return to PLOF.     Follow Up Recommendations  SNF    Equipment Recommendations  Other (comment) (TBD at next venue of care)    Recommendations for Other Services       Precautions / Restrictions Precautions Precautions:  Fall Restrictions Weight Bearing Restrictions: Yes LUE Weight Bearing: Non weight bearing LLE Weight Bearing: Weight bearing as tolerated      Mobility Bed Mobility    General bed mobility comments: Pt received and left up in chair  Transfers Overall transfer level: Needs assistance Equipment used: Hemi-walker;1 person hand held assist        General transfer comment: Pt unable to clear rear using HW and 1HHA - pt endorses pain significantly increased since PT session this AM - further mobility deferred     Balance Overall balance assessment: Needs assistance Sitting-balance support: Feet supported Sitting balance-Leahy Scale: Good        ADL either performed or assessed with clinical judgement   ADL Overall ADL's : Needs assistance/impaired    General ADL Comments: SETUP self-drinking seated in chair. MAX A for LB access in sitting. 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 Generalized weakness LUE: Unable to fully assess due to immobilization   Lower Extremity Assessment Lower Extremity Assessment: LLE deficits/detail LLE: Unable to fully assess due to pain       Communication Communication Communication: No difficulties   Cognition Arousal/Alertness: Awake/alert Behavior During Therapy: WFL for tasks assessed/performed Overall Cognitive Status: History  of cognitive impairments - at baseline          General Comments       Exercises Exercises: Other exercises Other  Exercises Other Exercises: Pt and family educated re: OT role, DME recs, d/c recs, falls prevention, pain mgmt strategies, ECS, functional application of LUE NWBing pcns Other Exercises: LBD, don shoulder sling, sitting balance/tolerance, scooting and lateral leans in chair, self-drinking   Shoulder Instructions      Home Living Family/patient expects to be discharged to:: Private residence Living Arrangements: Spouse/significant other (pt is caregiver for demented husband ) Available Help at Discharge: Family;Personal care attendant (in-home aide 8-1 7d/w) Type of Home: House Home Access: Elevator     Home Layout: One level               Home Equipment: None          Prior Functioning/Environment Level of Independence: Independent                 OT Problem List: Decreased strength;Decreased range of motion;Decreased activity tolerance;Decreased safety awareness;Decreased knowledge of use of DME or AE;Pain;Impaired UE functional use      OT Treatment/Interventions: Self-care/ADL training;Therapeutic exercise;DME and/or AE instruction;Energy conservation;Therapeutic activities;Cognitive remediation/compensation;Patient/family education;Balance training    OT Goals(Current goals can be found in the care plan section) Acute Rehab OT Goals Patient Stated Goal: return to independent walking OT Goal Formulation: With patient/family Time For Goal Achievement: 05/29/20 Potential to Achieve Goals: Good ADL Goals Pt Will Perform Eating: with set-up;sitting Pt Will Perform Upper Body Dressing: with min assist;with caregiver independent in assisting;sitting (c AE PRN) Pt Will Transfer to Toilet: with mod assist;stand pivot transfer;bedside commode (c LRAD PRN)  OT Frequency: Min 2X/week   Barriers to D/C: Inaccessible home environment;Decreased caregiver support          Co-evaluation              AM-PAC OT "6 Clicks" Daily Activity     Outcome Measure Help  from another person eating meals?: A Little Help from another person taking care of personal grooming?: A Little Help from another person toileting, which includes using toliet, bedpan, or urinal?: A Lot Help from another person bathing (including washing, rinsing, drying)?: A Lot Help from another person to put on and taking off regular upper body clothing?: A Lot Help from another person to put on and taking off regular lower body clothing?: A Lot 6 Click Score: 14   End of Session Nurse Communication: Patient requests pain meds;Other (comment) (Shoulder sling waist strap missing )  Activity Tolerance: Patient limited by pain Patient left: in chair;with call bell/phone within reach;with chair alarm set;with family/visitor present  OT Visit Diagnosis: Other abnormalities of gait and mobility (R26.89);Muscle weakness (generalized) (M62.81)                Time: 5053-9767 OT Time Calculation (min): 18 min Charges:  OT General Charges $OT Visit: 1 Visit OT Evaluation $OT Eval Low Complexity: 1 Low OT Treatments $Self Care/Home Management : 8-22 mins  Dessie Coma, M.S. OTR/L  05/15/20, 11:27 AM  ascom [phone]

## 2020-05-15 NOTE — Progress Notes (Signed)
Sarah Velasquez  ZHY:[id number] DOB: 09/22/1945 DOA: 05/12/2020 PCP: Dion Body, MD   Chief complaint.  Hip pain. Brief Narrative:  Sarah Velasquez a 74 y.o.femalewith medical history significant ofhypertension, diabetes mellitus, GERD, depression, anxiety, vertigo, chronic bronchitis, who presents with fall and pain in left hip and shoulder. Patient had accidental fall at home, he sustained a left hip fracture. Scheduled for surgery by orthopedics.  9/9.  Hip surgery with intramedullary nail performed.  9/10.  Patient has transient hypotension, give fluid bolus.  Very confused this morning, no agitation.  9/11.  Patient had a near syncope episode today, will continue for another day of fluids.   Assessment & Plan:   Principal Problem:   Closed intertrochanteric fracture of hip, left, initial encounter [facility]) Active Problems:   Mixed hyperlipidemia   GERD   Chronic bronchitis ([place])   Fall at home, initial encounter   Closed fracture dislocation of left shoulder   Diabetes mellitus without complication ([place])   Hypotension   Delirium with dementia   Acute blood loss anemia  #1.  Left hip fracture. Postop day #2.  Doing well.  Pain under control.  2.  Delirium with Alzheimer dementia. Condition much improved.  No evidence of UTI.  3.  Hypotension secondary to dehydration. Received fluid bolus yesterday, patient still has some dizziness when standing up, continue IV fluids today.  4.  Acute blood loss anemia. Continue to follow, transfuse as needed.  5.  Chronic bronchitis.  Stable  6.  Type 2 diabetes per Continue sliding scale insulin.    DVT prophylaxis: Lovenox Code Status: Full Family Communication: None   Status is: Inpatient  Remains inpatient appropriate because:IV treatments appropriate due to intensity of illness or inability to take PO   Dispo: The patient is from: Home              Anticipated d/c is to: SNF               Anticipated d/c date is: 2 days              Patient currently is not medically stable to d/c.        I/O last 3 completed shifts: In: 1593.5 [P.O.:240; I.V.:1353.5] Out: 1200 [Urine:1200] No intake/output data recorded.     Consultants:   Orthopedic spine  Procedures: Hip surgery.  Antimicrobials:None  Subjective: Patient was dizzy and lightheaded, had a near syncope episode this morning when she was walking with physical therapy.  Otherwise she doing well, confusion has resolved.  Good appetite without nausea vomiting.  Denies any short of breath or cough.  Objective: Vitals:   05/14/20 1131 05/14/20 2309 05/15/20 0011 05/15/20 0731  BP: 104/63 (!) 109/50 (!) 103/56 (!) 105/51  Pulse: 80 71 87 78  Resp: 18 16 17 17   Temp: 98 F (36.7 C) 98.8 F (37.1 C) 98.9 F (37.2 C) 98.2 F (36.8 C)  TempSrc: Oral Oral Oral Oral  SpO2: 90% 94% 95% 96%  Weight:      Height:        Intake/Output Summary (Last 24 hours) at 05/15/2020 1045 Last data filed at 05/15/2020 0545 Gross per 24 hour  Intake 1593.46 ml  Output 300 ml  Net 1293.46 ml   Filed Weights   05/12/20 1201  Weight: 67.1 kg    Examination:  General exam: Appears calm and comfortable  Respiratory system: Clear to auscultation. Respiratory effort normal. Cardiovascular system: S1 & S2  heard, RRR. No JVD, murmurs, rubs, gallops or clicks. No pedal edema. Gastrointestinal system: Abdomen is nondistended, soft and nontender. No organomegaly or masses felt. Normal bowel sounds heard. Central nervous system: Alert and oriented. No focal neurological deficits. Extremities: Symmetric  Skin: No rashes, lesions or ulcers Psychiatry: Judgement and insight appear normal. Mood & affect appropriate.     Data Reviewed: I have personally reviewed following labs and imaging studies  CBC: Recent Labs  Lab 05/12/20 1908 05/13/20 0554 05/14/20 0515 05/15/20 0504  WBC 17.4* 12.0* 11.2* 8.3  NEUTROABS 15.6*  --    --  5.5  HGB 12.2 11.7* 9.1* 8.3*  HCT 36.2 33.2* 26.4* 24.1*  MCV 88.1 85.1 88.9 88.0  PLT 234 216 162 474   Basic Metabolic Panel: Recent Labs  Lab 05/12/20 1908 05/13/20 0554 05/14/20 0515 05/15/20 0504  NA 142 139 139 139  K 4.1 4.1 3.2* 3.9  CL 107 103 107 108  CO2 22 23 19* 22  GLUCOSE 170* 118* 128* 113*  BUN 21 20 13 10   CREATININE 0.90 0.92 0.77 0.64  CALCIUM 9.6 9.3 8.1* 8.3*  MG  --   --   --  1.8   GFR: Estimated Creatinine Clearance: 55.5 mL/min (by C-G formula based on SCr of 0.64 mg/dL). Liver Function Tests: No results for input(s): AST, ALT, ALKPHOS, BILITOT, PROT, ALBUMIN in the last 168 hours. No results for input(s): LIPASE, AMYLASE in the last 168 hours. No results for input(s): AMMONIA in the last 168 hours. Coagulation Profile: Recent Labs  Lab 05/12/20 1908  INR 1.1   Cardiac Enzymes: No results for input(s): CKTOTAL, CKMB, CKMBINDEX, TROPONINI in the last 168 hours. BNP (last 3 results) No results for input(s): PROBNP in the last 8760 hours. HbA1C: No results for input(s): HGBA1C in the last 72 hours. CBG: Recent Labs  Lab 05/14/20 0820 05/14/20 1248 05/14/20 1655 05/14/20 2100 05/15/20 0732  GLUCAP 101* 83 97 131* 103*   Lipid Profile: No results for input(s): CHOL, HDL, LDLCALC, TRIG, CHOLHDL, LDLDIRECT in the last 72 hours. Thyroid Function Tests: No results for input(s): TSH, T4TOTAL, FREET4, T3FREE, THYROIDAB in the last 72 hours. Anemia Panel: No results for input(s): VITAMINB12, FOLATE, FERRITIN, TIBC, IRON, RETICCTPCT in the last 72 hours. Sepsis Labs: No results for input(s): PROCALCITON, LATICACIDVEN in the last 168 hours.  Recent Results (from the past 240 hour(s))  SARS Coronavirus 2 by RT PCR (hospital order, performed in [facility] hospital lab) Nasopharyngeal Nasopharyngeal Swab     Status: None   Collection Time: 05/12/20  3:58 PM   Specimen: Nasopharyngeal Swab  Result Value Ref Range Status   SARS Coronavirus  2 NEGATIVE NEGATIVE Final    Comment: (NOTE) SARS-CoV-2 target nucleic acids are NOT DETECTED.  The SARS-CoV-2 RNA is generally detectable in upper and lower respiratory specimens during the acute phase of infection. The lowest concentration of SARS-CoV-2 viral copies this assay can detect is 250 copies / mL. A negative result does not preclude SARS-CoV-2 infection and should not be used as the sole basis for treatment or other patient management decisions.  A negative result may occur with improper specimen collection / handling, submission of specimen other than nasopharyngeal swab, presence of viral mutation(s) within the areas targeted by this assay, and inadequate number of viral copies (<250 copies / mL). A negative result must be combined with clinical observations, patient history, and epidemiological information.  Fact Sheet for Patients:   StrictlyIdeas.no  Fact Sheet for Healthcare  Providers: BankingDealers.co.za  This test is not yet approved or  cleared by the [place] and has been authorized for detection and/or diagnosis of SARS-CoV-2 by FDA under an Emergency Use Authorization (EUA).  This EUA will remain in effect (meaning this test can be used) for the duration of the COVID-19 declaration under Section 564(b)(1) of the Act, 21 U.S.C. section 360bbb-3(b)(1), unless the authorization is terminated or revoked sooner.  Performed at [facility], [address] [place], [place] [address]   MRSA PCR Screening     Status: None   Collection Time: 05/13/20  7:34 AM   Specimen: Nasopharyngeal  Result Value Ref Range Status   MRSA by PCR NEGATIVE NEGATIVE Final    Comment:        The GeneXpert MRSA Assay (FDA approved for NASAL specimens only), is one component of a comprehensive MRSA colonization surveillance program. It is not intended to diagnose MRSA infection nor to guide or monitor treatment  for MRSA infections. Performed at [facility], [address] [place], [place] [address]          Radiology Studies: DG HIP OPERATIVE UNILAT W OR W/O PELVIS LEFT  Result Date: 05/13/2020 CLINICAL DATA:  LEFT hip surgery EXAM: OPERATIVE LEFT HIP (WITH PELVIS IF PERFORMED) 4 VIEWS TECHNIQUE: Fluoroscopic spot image(s) were submitted for interpretation post-operatively. COMPARISON:  05/12/2020 FLUOROSCOPY TIME:  0 minutes 49.5 seconds Images: 4 Dose: 4.8664 mGy FINDINGS: Images demonstrate placement of an IM nail with proximal compression screw in LEFT femur post ORIF of an intertrochanteric fracture. Displaced lesser trochanteric fracture fragment again seen. Distal locking screw placed. Bones demineralized. IMPRESSION: Post ORIF of previously identified intertrochanteric fracture LEFT femur. Electronically Signed   By: Lavonia Dana M.D.   On: 05/13/2020 16:57        Scheduled Meds: . aspirin EC  81 mg Oral Daily  . atorvastatin  80 mg Oral Daily  . cholecalciferol  1,000 Units Oral Daily  . docusate sodium  100 mg Oral BID  . enoxaparin (LOVENOX) injection  40 mg Subcutaneous Q24H  . escitalopram  5 mg Oral Daily  . ezetimibe  10 mg Oral Daily  . famotidine  20 mg Oral BID  . feeding supplement (GLUCERNA SHAKE)  237 mL Oral TID BM  . ferrous [id number] C-folic acid  1 capsule Oral BID PC  . insulin aspart  0-5 Units Subcutaneous QHS  . insulin aspart  0-9 Units Subcutaneous TID WC  . loratadine  10 mg Oral Daily  . multivitamin with minerals  1 tablet Oral Daily   Continuous Infusions: . sodium chloride Stopped (05/14/20 2228)     LOS: 3 days    Time spent: 28 minutes    Sharen Hones, MD [facility]   To contact the attending provider between 7A-7P or the covering provider during after hours 7P-7A, please log into the web site [email or web address] and access using universal [place] password for that web site. If you do not have the password,  please call the hospital operator.  05/15/2020, 10:45 AM

## 2020-05-15 NOTE — Progress Notes (Signed)
ORTHOPAEDICS PROGRESS NOTE  PATIENT NAME: Sarah Velasquez DOB: Jun 16, 1946  MRN: [id number]  POD # 2: ORIF left intertrochanteric femur fracture Anterior dislocation/fracture of the left shoulder  Subjective: The patient is awake and denies any significant pain at this time.  Sling is in place to the left upper extremity.  Objective: Vital signs in last 24 hours: Temp:  [98 F (36.7 C)-98.9 F (37.2 C)] 98.2 F (36.8 C) (09/11 0731) Pulse Rate:  [71-87] 78 (09/11 0731) Resp:  [16-18] 17 (09/11 0731) BP: (87-109)/(41-63) 105/51 (09/11 0731) SpO2:  [90 %-99 %] 96 % (09/11 0731)  Intake/Output from previous day: 09/10 0701 - 09/11 0700 In: 1593.5 [P.O.:240; I.V.:1353.5] Out: 300 [Urine:300]  Recent Labs    05/12/20 1908 05/12/20 1908 05/13/20 0554 05/13/20 0554 05/14/20 0515 05/15/20 0504  WBC 17.4*  --  12.0*  --  11.2* 8.3  HGB 12.2  --  11.7*  --  9.1* 8.3*  HCT 36.2  --  33.2*  --  26.4* 24.1*  PLT 234  --  216  --  162 160  K 4.1   < > 4.1   < > 3.2* 3.9  CL 107   < > 103   < > 107 108  CO2 22   < > 23   < > 19* 22  BUN 21   < > 20   < > 13 10  CREATININE 0.90   < > 0.92   < > 0.77 0.64  GLUCOSE 170*   < > 118*   < > 128* 113*  CALCIUM 9.6   < > 9.3   < > 8.1* 8.3*  INR 1.1  --   --   --   --   --    < > = values in this interval not displayed.    EXAM General: Well-developed well-nourished female seen in no apparent discomfort. Left lower extremity: Dressings to the left hip are dry and intact.  The thigh is supple.  No appreciable ecchymosis or erythema.  Homans test is negative. Left upper extremity: Sling is in place.  No significant tenderness to palpation about the shoulder.  No gross swelling or ecchymosis to the shoulder or upper arm. Neurologic: Awake and alert.  Sensory and motor function are grossly intact.  Assessment: ORIF of left intertrochanteric femur fracture Anteror dislocation of the left shoulder  Secondary  diagnoses: Hypertension Diabetes Gastroesophageal reflux disease Depression Anxiety Vertigo Chronic bronchitis Dementia Acute blood loss anemia  Plan: Slight decrease in hemoglobin from yesterday.  Patient appears to be hemodynamically stable.   Physical therapy notes from yesterday were reviewed.  The patient is making slow progress with her mobility and gait.  She may be weightbearing as tolerated to the left lower extremity.  Continue physical therapy at this time. Plan is to go Skilled nursing facility after hospital stay. DVT Prophylaxis - Lovenox, TED hose and SCDs  Zarahi Fuerst P. Holley Bouche M.D.

## 2020-05-16 LAB — CBC WITH DIFFERENTIAL/PLATELET
Abs Immature Granulocytes: 0.07 10*3/uL (ref 0.00–0.07)
Basophils Absolute: 0.1 10*3/uL (ref 0.0–0.1)
Basophils Relative: 1 %
Eosinophils Absolute: 0.1 10*3/uL (ref 0.0–0.5)
Eosinophils Relative: 1 %
HCT: 23.3 % — ABNORMAL LOW (ref 36.0–46.0)
Hemoglobin: 8.1 g/dL — ABNORMAL LOW (ref 12.0–15.0)
Immature Granulocytes: 1 %
Lymphocytes Relative: 14 %
Lymphs Abs: 1.2 10*3/uL (ref 0.7–4.0)
MCH: 30.7 pg (ref 26.0–34.0)
MCHC: 34.8 g/dL (ref 30.0–36.0)
MCV: 88.3 fL (ref 80.0–100.0)
Monocytes Absolute: 0.8 10*3/uL (ref 0.1–1.0)
Monocytes Relative: 9 %
Neutro Abs: 6.6 10*3/uL (ref 1.7–7.7)
Neutrophils Relative %: 74 %
Platelets: 172 10*3/uL (ref 150–400)
RBC: 2.64 MIL/uL — ABNORMAL LOW (ref 3.87–5.11)
RDW: 13.8 % (ref 11.5–15.5)
WBC: 8.8 10*3/uL (ref 4.0–10.5)
nRBC: 0 % (ref 0.0–0.2)

## 2020-05-16 LAB — GLUCOSE, CAPILLARY
Glucose-Capillary: 109 mg/dL — ABNORMAL HIGH (ref 70–99)
Glucose-Capillary: 111 mg/dL — ABNORMAL HIGH (ref 70–99)
Glucose-Capillary: 130 mg/dL — ABNORMAL HIGH (ref 70–99)
Glucose-Capillary: 139 mg/dL — ABNORMAL HIGH (ref 70–99)
Glucose-Capillary: 98 mg/dL (ref 70–99)

## 2020-05-16 MED ORDER — ACETAMINOPHEN 325 MG PO TABS
650.0000 mg | ORAL_TABLET | Freq: Four times a day (QID) | ORAL | Status: DC | PRN
  Administered 2020-05-16 – 2020-05-17 (×3): 650 mg via ORAL
  Filled 2020-05-16 (×5): qty 2

## 2020-05-16 MED ORDER — LACTULOSE 10 GM/15ML PO SOLN
20.0000 g | Freq: Once | ORAL | Status: DC
  Filled 2020-05-16: qty 30

## 2020-05-16 MED ORDER — QUETIAPINE FUMARATE 25 MG PO TABS
12.5000 mg | ORAL_TABLET | Freq: Once | ORAL | Status: AC
  Administered 2020-05-16: 12.5 mg via ORAL

## 2020-05-16 MED ORDER — QUETIAPINE FUMARATE 25 MG PO TABS: 12.5000 mg | ORAL_TABLET | Freq: Once | ORAL | Status: DC

## 2020-05-16 MED ORDER — DONEPEZIL HCL 5 MG PO TABS
10.0000 mg | ORAL_TABLET | Freq: Every day | ORAL | Status: DC
  Administered 2020-05-16 – 2020-05-18 (×3): 10 mg via ORAL
  Filled 2020-05-16 (×3): qty 2

## 2020-05-16 MED ORDER — QUETIAPINE FUMARATE 25 MG PO TABS
25.0000 mg | ORAL_TABLET | Freq: Every day | ORAL | Status: DC
  Administered 2020-05-16 – 2020-05-18 (×3): 25 mg via ORAL
  Filled 2020-05-16 (×4): qty 1

## 2020-05-16 MED ORDER — QUETIAPINE FUMARATE 25 MG PO TABS: 12.5000 mg | ORAL_TABLET | ORAL | Status: DC

## 2020-05-16 NOTE — Progress Notes (Addendum)
  Subjective: 3 Days Post-Op Procedure(s) (LRB): INTRAMEDULLARY (IM) NAIL INTERTROCHANTRIC (Left) Patient reports soreness in the shoulder and hip. Per chart review, patient did have an unwitnessed fall this AM but does not seem to have any further injuries.  Patient is pleasant but confused with daughter at bedside.  Plan is to go Skilled nursing facility after hospital stay. Negative for chest pain and shortness of breath Fever: no Gastrointestinal: negative for nausea and vomiting.   Objective: Vital signs in last 24 hours: Temp:  [98.1 F (36.7 C)-99 F (37.2 C)] 99 F (37.2 C) (09/12 0740) Pulse Rate:  [78-93] 88 (09/12 0740) Resp:  [14-20] 14 (09/12 0740) BP: (100-118)/(45-82) 114/62 (09/12 0740) SpO2:  [97 %-100 %] 97 % (09/12 0740)  Intake/Output from previous day:  Intake/Output Summary (Last 24 hours) at 05/16/2020 1007 Last data filed at 05/16/2020 0413 Gross per 24 hour  Intake --  Output 1350 ml  Net -1350 ml    Intake/Output this shift: No intake/output data recorded.  Labs: Recent Labs    05/14/20 0515 05/15/20 0504 05/16/20 0601  HGB 9.1* 8.3* 8.1*   Recent Labs    05/15/20 0504 05/16/20 0601  WBC 8.3 8.8  RBC 2.74* 2.64*  HCT 24.1* 23.3*  PLT 160 172   Recent Labs    05/14/20 0515 05/15/20 0504  NA 139 139  K 3.2* 3.9  CL 107 108  CO2 19* 22  BUN 13 10  CREATININE 0.77 0.64  GLUCOSE 128* 113*  CALCIUM 8.1* 8.3*   No results for input(s): LABPT, INR in the last 72 hours.   EXAM General - Patient is pleasant but confused. Extremity - Neurovascular intact Dorsiflexion/Plantar flexion intact Compartment soft, no TED hose applied; nontender to palpation of the right lower extremity; nontender to palpation of the right upper extremity; TTP to the left shoulder, mild edema noted to the left shoulder, sling in place; nontender to palpation of the left lower extremity Dressing/Incision -clean, dry, no drainage Motor Function - intact,  moving foot and toes well on exam.     Assessment/Plan: 3 Days Post-Op Procedure(s) (LRB): INTRAMEDULLARY (IM) NAIL INTERTROCHANTRIC (Left) Principal Problem:   Closed intertrochanteric fracture of hip, left, initial encounter [facility]) Active Problems:   Mixed hyperlipidemia   GERD   Chronic bronchitis ([place])   Fall at home, initial encounter   Closed fracture dislocation of left shoulder   Diabetes mellitus without complication ([place])   Hypotension   Delirium with dementia   Acute blood loss anemia  Estimated body mass index is 24.63 kg/m as calculated from the following:   Height as of this encounter: 5\' 5"  (1.651 m).   Weight as of this encounter: 67.1 kg.  Continue physical therapy.  TED hose applied.  Plan is to discharge to SNF pending bed placement.   DVT Prophylaxis - Lovenox, Ted hose and SCDs Weight-Bearing as tolerated to left leg  Cassell Smiles, PA-C [facility] Orthopaedic Surgery 05/16/2020, 10:07 AM  ADDENDUM: Swathe for shoulder immobilizer apparently was not applied in the ED. I used a gait belt to serve as a swathe to better immobilize the shoulder.  Cathalina Barcia P. Holley Bouche M.D.

## 2020-05-16 NOTE — Progress Notes (Signed)
Daughter called this morning and she advised this nurse she would be here around 9am.

## 2020-05-16 NOTE — Progress Notes (Addendum)
Dr. Roosevelt Locks told this nurse it was alright to give patient's half dose of Seroquel now and then regular at bedtime.

## 2020-05-16 NOTE — Progress Notes (Signed)
Patient is very confused, impulsive and argumentative. She continues to make attempts to get in and out of bed and chair. Sitter ordered for tonight. Daughter has been here with patient all day.

## 2020-05-16 NOTE — Progress Notes (Signed)
Patient still remains confused and agitated. Sitter at bedside.

## 2020-05-16 NOTE — Progress Notes (Signed)
°   05/15/20 2038  What Happened  Was fall witnessed? No  Was patient injured? No  Patient found on floor  Found by Staff-comment  Stated prior activity to/from bed, chair, or stretcher  Follow Up  MD notified Sharion Settler NP  Time MD notified 2038  Family notified Yes - comment (Daughter dana notified.)  Time family notified 2038  Additional tests Yes-comment (CT of head ordered)  Progress note created (see row info) Yes  Adult Fall Risk Assessment  Risk Factor Category (scoring not indicated) History of more than one fall within 6 months before admission (document High fall risk);High fall risk per protocol (document High fall risk)  Patient Fall Risk Level High fall risk  Adult Fall Risk Interventions  Required Bundle Interventions *See Row Information* High fall risk - low, moderate, and high requirements implemented  Additional Interventions Use of appropriate toileting equipment (bedpan, BSC, etc.);Reorient/diversional activities with confused patients;Room near nurses station  Screening for Fall Injury Risk (To be completed on HIGH fall risk patients) - Assessing Need for Low Bed  Risk For Fall Injury- Low Bed Criteria Admitted as a result of a fall  Will Implement Low Bed and Floor Mats Yes  Vitals  Temp 98.5 F (36.9 C)  Temp Source Oral  BP 113/82  MAP (mmHg) 90  BP Location Right Arm  BP Method Automatic  Patient Position (if appropriate) Sitting  Pulse Rate 85  Resp 20  Oxygen Therapy  SpO2 99 %  O2 Device Room Air  Pain Assessment  Pain Scale 0-10  Pain Score 2  Pain Type Surgical pain  Pain Location Hip  Pain Orientation Left  Pain Frequency Intermittent  Pain Onset With Activity  Patients Stated Pain Goal 0  Pain Intervention(s) Medication (See eMAR)  Multiple Pain Sites No  PCA/Epidural/Spinal Assessment  Respiratory Pattern Regular  Neurological  Neuro (WDL) X  Level of Consciousness Alert  Orientation Level Oriented to person  Cognition Poor  safety awareness;Poor judgement  Speech Clear  Pupil Assessment  No  Neuro Symptoms None  Musculoskeletal  Musculoskeletal (WDL) X  Assistive Device BSC  Generalized Weakness Yes  Weight Bearing Restrictions Yes  LUE Weight Bearing NWB  LLE Weight Bearing PWB  Musculoskeletal Details  RUE Full movement  LUE Limited movement  LUE Ortho/Supportive Device Brace (Comment)  LLE Limited movement;Surgery  Integumentary  Integumentary (WDL) X  RN Assisting with Skin Assessment on Admission  (Alper Guilmette, RN)  Skin Color Appropriate for ethnicity  Skin Condition Dry  Skin Integrity Surgical Incision (see LDA)  Abrasion Location Arm;Elbow  Abrasion Location Orientation Right  Skin Turgor Non-tenting  Pain Assessment  Date Pain First Started 05/14/20  Result of Injury Yes  Pain Screening  Clinical Progression Gradually improving  Effect of Pain on Daily Activities  (Limited movement of L/U extremity)  Pain Assessment  Work-Related Injury No

## 2020-05-16 NOTE — Progress Notes (Signed)
Physical Therapy Treatment Patient Details Name: Sarah Velasquez MRN: [id number] DOB: July 04, 1946 Today's Date: 05/16/2020    History of Present Illness Mischa Brittingham is a 27yoF who comes to [facility] 9/8 from home after a fall. Pt c/o Lt hip and shoulder pain. Imaging reveals Left hip fracture, left shoulder fracture/dislocation. Pt underwent Left femur ORIF on 9/9 c Dr. Rudene Christians c IM nailing. LLE now WBAT. 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    PT Comments    Fall noted this am.  Discussed with RN, OK for therapy.  Participated in exercises as described below. Donned L shoulder sling as it was removed.  Stood to [facility] with mod a x 1.  Pt seemed hesitant to step today with SPC.  She is returned to sitting and taken to hallway where she was able to walk 12' in hallway with min a x 1 and heavy lean on rail in hallway.  Fatigued with effort but overall does well.  Daughter in to assist.  She does not show and increased pain or affects from fall earlier today.   Follow Up Recommendations  SNF;Supervision/Assistance - 24 hour;Supervision for mobility/OOB     Equipment Recommendations       Recommendations for Other Services       Precautions / Restrictions Precautions Precautions: Fall Restrictions Weight Bearing Restrictions: Yes LUE Weight Bearing: Non weight bearing LLE Weight Bearing: Weight bearing as tolerated    Mobility  Bed Mobility               General bed mobility comments: in chair before and after  Transfers Overall transfer level: Needs assistance Equipment used: Straight cane (rail in hallway) Transfers: Sit to/from Stand Sit to Stand: [place] transfer comment: min/mod a x 1 to straight cane.  generally unsteady with SPC.  Does better with rail in hallway for support.  Ambulation/Gait Ambulation/Gait assistance: Min assist Gait Distance (Feet): 12 Feet Assistive device:   (rail in hallway) Gait Pattern/deviations: Step-to pattern Gait velocity: decreased   General Gait Details: hesitant with SPC, increased confidence with rail in hallway   Stairs             Wheelchair Mobility    Modified Rankin (Stroke Patients Only)       Balance Overall balance assessment: Needs assistance Sitting-balance support: Feet supported Sitting balance-Leahy Scale: Good     Standing balance support: Single extremity supported;During functional activity Standing balance-Leahy Scale: Poor Standing balance comment: seemed to have more trouble today maintaining balance with SPC                            Cognition Arousal/Alertness: Awake/alert Behavior During Therapy: WFL for tasks assessed/performed Overall Cognitive Status: History of cognitive impairments - at baseline                                        Exercises Other Exercises Other Exercises: ankle pumps. SLR,. ab/add and heel slides x 10 AAROM    General Comments        Pertinent Vitals/Pain Pain Assessment: Faces Faces Pain Scale: Hurts a little bit Pain Location: L hip Pain Descriptors / Indicators: Discomfort;Grimacing;Operative site guarding Pain Intervention(s): Limited activity within patient's tolerance;Monitored during session;Repositioned  Home Living                      Prior Function            PT Goals (current goals can now be found in the care plan section) Progress towards PT goals: Progressing toward goals    Frequency    BID      PT Plan Current plan remains appropriate    Co-evaluation              AM-PAC PT "6 Clicks" Mobility   Outcome Measure  Help needed turning from your back to your side while in a flat bed without using bedrails?: A Little Help needed moving from lying on your back to sitting on the side of a flat bed without using bedrails?: A Lot Help needed moving to and from a bed to a chair  (including a wheelchair)?: A Lot Help needed standing up from a chair using your arms (e.g., wheelchair or bedside chair)?: A Lot Help needed to walk in hospital room?: A Lot Help needed climbing 3-5 steps with a railing? : Total 6 Click Score: 12    End of Session Equipment Utilized During Treatment: Gait belt Activity Tolerance: Patient tolerated treatment well Patient left: in chair;with call bell/phone within reach;with chair alarm set Nurse Communication: Mobility status       Time: 2446-9507 PT Time Calculation (min) (ACUTE ONLY): 24 min  Charges:  $Gait Training: 8-22 mins $Therapeutic Exercise: 8-22 mins                    Chesley Noon, PTA 05/16/20, 11:40 AM

## 2020-05-16 NOTE — Plan of Care (Signed)
  Problem: Clinical Measurements: ?Goal: Will remain free from infection ?Outcome: Progressing ?  ?Problem: Activity: ?Goal: Risk for activity intolerance will decrease ?Outcome: Progressing ?  ?Problem: Coping: ?Goal: Level of anxiety will decrease ?Outcome: Progressing ?  ?Problem: Pain Managment: ?Goal: General experience of comfort will improve ?Outcome: Progressing ?  ?Problem: Safety: ?Goal: Ability to remain free from injury will improve ?Outcome: Progressing ?  ?

## 2020-05-16 NOTE — Progress Notes (Signed)
PROGRESS NOTE    Sarah Velasquez  FTD:[id number] DOB: [birth year] DOA: 05/12/2020 PCP: Dion Body, MD   Chief complaint.  Hip pain. Brief Narrative:  Sarah Velasquez a 74 y.o.femalewith medical history significant ofhypertension, diabetes mellitus, GERD, depression, anxiety, vertigo, chronic bronchitis, who presents with fall and pain in left hip and shoulder. Patient had accidental fall at home, he sustained a left hip fracture. Scheduled for surgery by orthopedics.  9/9.Hip surgery with intramedullary nail performed.  9/10.Patient has transient hypotension, give fluid bolus. Very confused this morning, no agitation.  9/11.  Patient had a near syncope episode today, will continue for another day of fluids.    Assessment & Plan:   Principal Problem:   Closed intertrochanteric fracture of hip, left, initial encounter [facility]) Active Problems:   Mixed hyperlipidemia   GERD   Chronic bronchitis ([place])   Fall at home, initial encounter   Closed fracture dislocation of left shoulder   Diabetes mellitus without complication ([place])   Hypotension   Delirium with dementia   Acute blood loss anemia  #1.  Left hip fracture. Post op day #3. Patient doing well.  Continue as needed pain medicine, continue PT and OT.  2.  Delirium with Alzheimer's dementia. Continue home medicine.  3.  Hypotension secondary dehydration. Resolved  4.  Acute blood loss anemia. Stable.  5.  Chronic bronchitis. Stable.  6.  Type 2 diabetes. Continue sliding scale insulin    DVT prophylaxis: Lovenox Code Status: Partial Family Communication: Updated daughter.  .   Status is: Inpatient  Remains inpatient appropriate because:Inpatient level of care appropriate due to severity of illness   Dispo: The patient is from: Home              Anticipated d/c is to: SNF              Anticipated d/c date is: 1 day              Patient currently is not medically stable to d/c.         I/O last 3 completed shifts: In: -  Out: 1650 [Urine:1650] No intake/output data recorded.     Consultants:   Orthopedics.  Procedures: Hip surgery.  Antimicrobials: None  Subjective: Patient is significant confused this morning.  Denies any short of breath or cough.  She has been able to ambulate with physical therapy. No abdominal pain or nausea vomiting. Last BM 9/8. Lactulose given. Patient had more agitation yesterday, was given as needed Haldol.  Objective: Vitals:   05/15/20 1548 05/15/20 2038 05/16/20 0005 05/16/20 0740  BP: (!) 104/52 113/82 (!) 100/45 114/62  Pulse: 78 85 84 88  Resp: 19 20 17 14   Temp: 98.3 F (36.8 C) 98.5 F (36.9 C) 98.1 F (36.7 C) 99 F (37.2 C)  TempSrc: Oral Oral Oral Oral  SpO2: 100% 99%  97%  Weight:      Height:        Intake/Output Summary (Last 24 hours) at 05/16/2020 1022 Last data filed at 05/16/2020 0413 Gross per 24 hour  Intake --  Output 1350 ml  Net -1350 ml   Filed Weights   05/12/20 1201  Weight: 67.1 kg    Examination:  General exam: Appears calm and comfortable  Respiratory system: Clear to auscultation. Respiratory effort normal. Cardiovascular system: S1 & S2 heard, RRR. No JVD, murmurs, rubs, gallops or clicks. No pedal edema. Gastrointestinal system: Abdomen is nondistended, soft and nontender. No organomegaly or masses  felt. Normal bowel sounds heard. Central nervous system: Alert and oriented x1. No focal neurological deficits. Extremities: Symmetric  Skin: No rashes, lesions or ulcers Psychiatry: . Mood & affect appropriate.     Data Reviewed: I have personally reviewed following labs and imaging studies  CBC: Recent Labs  Lab 05/12/20 1908 05/13/20 0554 05/14/20 0515 05/15/20 0504 05/16/20 0601  WBC 17.4* 12.0* 11.2* 8.3 8.8  NEUTROABS 15.6*  --   --  5.5 6.6  HGB 12.2 11.7* 9.1* 8.3* 8.1*  HCT 36.2 33.2* 26.4* 24.1* 23.3*  MCV 88.1 85.1 88.9 88.0 88.3  PLT 234 216 162 160 836    Basic Metabolic Panel: Recent Labs  Lab 05/12/20 1908 05/13/20 0554 05/14/20 0515 05/15/20 0504  NA 142 139 139 139  K 4.1 4.1 3.2* 3.9  CL 107 103 107 108  CO2 22 23 19* 22  GLUCOSE 170* 118* 128* 113*  BUN 21 20 13 10   CREATININE 0.90 0.92 0.77 0.64  CALCIUM 9.6 9.3 8.1* 8.3*  MG  --   --   --  1.8   GFR: Estimated Creatinine Clearance: 55.5 mL/min (by C-G formula based on SCr of 0.64 mg/dL). Liver Function Tests: No results for input(s): AST, ALT, ALKPHOS, BILITOT, PROT, ALBUMIN in the last 168 hours. No results for input(s): LIPASE, AMYLASE in the last 168 hours. No results for input(s): AMMONIA in the last 168 hours. Coagulation Profile: Recent Labs  Lab 05/12/20 1908  INR 1.1   Cardiac Enzymes: No results for input(s): CKTOTAL, CKMB, CKMBINDEX, TROPONINI in the last 168 hours. BNP (last 3 results) No results for input(s): PROBNP in the last 8760 hours. HbA1C: No results for input(s): HGBA1C in the last 72 hours. CBG: Recent Labs  Lab 05/15/20 1202 05/15/20 1548 05/15/20 2135 05/16/20 0018 05/16/20 0741  GLUCAP 156* 94 137* 139* 130*   Lipid Profile: No results for input(s): CHOL, HDL, LDLCALC, TRIG, CHOLHDL, LDLDIRECT in the last 72 hours. Thyroid Function Tests: No results for input(s): TSH, T4TOTAL, FREET4, T3FREE, THYROIDAB in the last 72 hours. Anemia Panel: Recent Labs    05/15/20 0504  TIBC 214*  IRON 18*   Sepsis Labs: No results for input(s): PROCALCITON, LATICACIDVEN in the last 168 hours.  Recent Results (from the past 240 hour(s))  SARS Coronavirus 2 by RT PCR (hospital order, performed in [facility] hospital lab) Nasopharyngeal Nasopharyngeal Swab     Status: None   Collection Time: 05/12/20  3:58 PM   Specimen: Nasopharyngeal Swab  Result Value Ref Range Status   SARS Coronavirus 2 NEGATIVE NEGATIVE Final    Comment: (NOTE) SARS-CoV-2 target nucleic acids are NOT DETECTED.  The SARS-CoV-2 RNA is generally detectable in upper  and lower respiratory specimens during the acute phase of infection. The lowest concentration of SARS-CoV-2 viral copies this assay can detect is 250 copies / mL. A negative result does not preclude SARS-CoV-2 infection and should not be used as the sole basis for treatment or other patient management decisions.  A negative result may occur with improper specimen collection / handling, submission of specimen other than nasopharyngeal swab, presence of viral mutation(s) within the areas targeted by this assay, and inadequate number of viral copies (<250 copies / mL). A negative result must be combined with clinical observations, patient history, and epidemiological information.  Fact Sheet for Patients:   StrictlyIdeas.no  Fact Sheet for Healthcare Providers: BankingDealers.co.za  This test is not yet approved or  cleared by the [place] FDA and has  been authorized for detection and/or diagnosis of SARS-CoV-2 by FDA under an Emergency Use Authorization (EUA).  This EUA will remain in effect (meaning this test can be used) for the duration of the COVID-19 declaration under Section 564(b)(1) of the Act, 21 U.S.C. section 360bbb-3(b)(1), unless the authorization is terminated or revoked sooner.  Performed at [facility] [address] [place], [place] [address]   MRSA PCR Screening     Status: None   Collection Time: 05/13/20  7:34 AM   Specimen: Nasopharyngeal  Result Value Ref Range Status   MRSA by PCR NEGATIVE NEGATIVE Final    Comment:        The GeneXpert MRSA Assay (FDA approved for NASAL specimens only), is one component of a comprehensive MRSA colonization surveillance program. It is not intended to diagnose MRSA infection nor to guide or monitor treatment for MRSA infections. Performed at [facility], [address], [place], [place] [address]          Radiology Studies: CT HEAD WO  CONTRAST  Result Date: 05/15/2020 CLINICAL DATA:  Head trauma, minor. Possible head trauma. Unwitnessed fall. EXAM: CT HEAD WITHOUT CONTRAST TECHNIQUE: Contiguous axial images were obtained from the base of the skull through the vertex without intravenous contrast. COMPARISON:  Head CT 05/12/2020.  Brain MRI 12/10/2019. FINDINGS: Brain: Stable, mild to moderate generalized parenchymal atrophy. There is no acute intracranial hemorrhage. No demarcated cortical infarct. No extra-axial fluid collection. No evidence of intracranial mass. No midline shift. Vascular: No hyperdense vessel. Skull: Normal. Negative for fracture or focal lesion. Sinuses/Orbits: Visualized orbits show no acute finding. Mild ethmoid sinus mucosal thickening. No significant mastoid effusion. IMPRESSION: No evidence of acute intracranial abnormality. Stable mild-to-moderate generalized parenchymal atrophy. Mild ethmoid sinus mucosal thickening. Electronically Signed   By: Kellie Simmering DO   On: 05/15/2020 23:22        Scheduled Meds: . aspirin EC  81 mg Oral Daily  . atorvastatin  80 mg Oral Daily  . cholecalciferol  1,000 Units Oral Daily  . docusate sodium  100 mg Oral BID  . donepezil  10 mg Oral QHS  . enoxaparin (LOVENOX) injection  40 mg Subcutaneous Q24H  . escitalopram  5 mg Oral Daily  . ezetimibe  10 mg Oral Daily  . famotidine  20 mg Oral BID  . feeding supplement (GLUCERNA SHAKE)  237 mL Oral TID BM  . ferrous [id number] C-folic acid  1 capsule Oral BID PC  . insulin aspart  0-5 Units Subcutaneous QHS  . insulin aspart  0-9 Units Subcutaneous TID WC  . loratadine  10 mg Oral Daily  . multivitamin with minerals  1 tablet Oral Daily  . QUEtiapine  25 mg Oral QHS   Continuous Infusions: . sodium chloride 75 mL/hr at 05/16/20 0621     LOS: 4 days    Time spent: 26 minutes    Sharen Hones, MD [facility]   To contact the attending provider between 7A-7P or the covering provider during  after hours 7P-7A, please log into the web site [email or web address] and access using universal [place] password for that web site. If you do not have the password, please call the hospital operator.  05/16/2020, 10:22 AM

## 2020-05-16 NOTE — Progress Notes (Addendum)
Spoke to Dr. Roosevelt Locks and he said ok to write order for 12.5mg  Seroquel to give now and then regular dose at bedtime.

## 2020-05-17 LAB — URINALYSIS, ROUTINE W REFLEX MICROSCOPIC
Bilirubin Urine: NEGATIVE
Glucose, UA: NEGATIVE mg/dL
Hgb urine dipstick: NEGATIVE
Ketones, ur: 5 mg/dL — AB
Leukocytes,Ua: NEGATIVE
Nitrite: NEGATIVE
Protein, ur: NEGATIVE mg/dL
Specific Gravity, Urine: 1.016 (ref 1.005–1.030)
pH: 7 (ref 5.0–8.0)

## 2020-05-17 LAB — GLUCOSE, CAPILLARY
Glucose-Capillary: 137 mg/dL — ABNORMAL HIGH (ref 70–99)
Glucose-Capillary: 116 mg/dL — ABNORMAL HIGH (ref 70–99)
Glucose-Capillary: 111 mg/dL — ABNORMAL HIGH (ref 70–99)
Glucose-Capillary: 82 mg/dL (ref 70–99)
Glucose-Capillary: 154 mg/dL — ABNORMAL HIGH (ref 70–99)

## 2020-05-17 MED ORDER — ENOXAPARIN SODIUM 40 MG/0.4ML ~~LOC~~ SOLN: 40.0000 mg | SUBCUTANEOUS | 0 refills | Status: AC

## 2020-05-17 MED ORDER — OXYCODONE-ACETAMINOPHEN 5-325 MG PO TABS: 1.0000 | ORAL_TABLET | Freq: Once | ORAL | Status: AC

## 2020-05-17 MED ORDER — OXYCODONE-ACETAMINOPHEN 5-325 MG PO TABS
1.0000 | ORAL_TABLET | Freq: Four times a day (QID) | ORAL | 0 refills | Status: DC | PRN
Start: 2020-05-17 — End: 2023-10-10

## 2020-05-17 MED ORDER — ACETAMINOPHEN 325 MG PO TABS
650.0000 mg | ORAL_TABLET | Freq: Three times a day (TID) | ORAL | Status: DC
  Administered 2020-05-17 – 2020-05-19 (×4): 650 mg via ORAL
  Filled 2020-05-17 (×5): qty 2

## 2020-05-17 NOTE — Progress Notes (Signed)
Subjective: 4 Days Post-Op Procedure(s) (LRB): INTRAMEDULLARY (IM) NAIL INTERTROCHANTRIC (Left) Patient reports pain as mild. Resting this am Patient with agiation this am but improved. We will continue therapy today.  Plan is to go Skilled nursing facility after hospital stay.   Objective: Vital signs in last 24 hours: Temp:  [99.1 F (37.3 C)-99.3 F (37.4 C)] 99.1 F (37.3 C) (09/13 0737) Pulse Rate:  [86-106] 86 (09/13 0737) Resp:  [18-20] 20 (09/13 0737) BP: (104-122)/(53-67) 110/57 (09/13 0737) SpO2:  [95 %-98 %] 98 % (09/13 0737)  Intake/Output from previous day: No intake/output data recorded. Intake/Output this shift: No intake/output data recorded.  Recent Labs    05/15/20 0504 05/16/20 0601  HGB 8.3* 8.1*   Recent Labs    05/15/20 0504 05/16/20 0601  WBC 8.3 8.8  RBC 2.74* 2.64*  HCT 24.1* 23.3*  PLT 160 172   Recent Labs    05/15/20 0504  NA 139  K 3.9  CL 108  CO2 22  BUN 10  CREATININE 0.64  GLUCOSE 113*  CALCIUM 8.3*   No results for input(s): LABPT, INR in the last 72 hours.  EXAM General - Patient is Alert and Confused  Left upper extremity -minimal swelling throughout the left upper extremity.  Neurovascular intact.  No deformity Left lower extremity - Neurovascular intact Sensation intact distally Intact pulses distally Dorsiflexion/Plantar flexion intact No cellulitis present Compartment soft Dressing - dressing C/D/I and no drainage Motor Function - intact, moving foot and toes well on exam.   Past Medical History:  Diagnosis Date  . Anxiety   . Chest pain 02/15-16/2009   Hospital [facility]  chest pain rule out //stress Myoview negative 10/21/2007//Stress cardiolite ;apical thinning ,negative ischemia (09/14/2003)  . Diabetes mellitus    typeII  . Diverticulosis of colon 09/2003   colonoscopy ext.and internal hemorrhoids (Dr. Vira Agar )12/03/2006// colonoscopy polyps multiple  benign; interal hemorrhoids ,divertics 09/16/2003   . Drug-induced constipation   . Environmental allergies   . GERD (gastroesophageal reflux disease)    EGD hiatal hernia; esophagitis/sigmoidoscopy WNL (Dr. Vira Agar) 03/04/1996: ABD. U/S  WNL (06/1996)//EGD multiple polyps ;small H.H (09/16/2003)  . H/O hiatal hernia   . Hyperlipidemia 05/1995  . Osteopenia 12/2011   spine -1.8, hip -0.7  . PONV (postoperative nausea and vomiting)   . Right lumbar radiculitis 2013   DDD, spondylosis, L3 radiculitis (MRI 07/2012) - ESI 09/2011 (Dr Sharlet Salina)   . Vertigo    hx of    Assessment/Plan:   4 Days Post-Op Procedure(s) (LRB): INTRAMEDULLARY (IM) NAIL INTERTROCHANTRIC (Left) Principal Problem:   Closed intertrochanteric fracture of hip, left, initial encounter [facility]) Active Problems:   Mixed hyperlipidemia   GERD   Chronic bronchitis ([place])   Fall at home, initial encounter   Closed fracture dislocation of left shoulder   Diabetes mellitus without complication ([facility])   Hypotension   Delirium with dementia   Acute blood loss anemia  Estimated body mass index is 24.63 kg/m as calculated from the following:   Height as of this encounter: 5\' 5"  (1.651 m).   Weight as of this encounter: 67.1 kg. Advance diet Up with therapy, weightbearing as tolerated left lower extremity. Will work on bowel movement Labs and vital signs are stable Hemoglobin 8.1, stable    Follow-up with [place] [facility] orthopedics in 2 weeks for x-rays of the left shoulder and staple removal of the left hip TED hose bilateral lower extremity x6 weeks Lovenox 40 mg subcu daily x14 days  DVT Prophylaxis - Lovenox, TED hose and SCDs Weight-Bearing as tolerated to left leg   T. Rachelle Hora, PA-C [place] 05/17/2020, 12:00 PM

## 2020-05-17 NOTE — Care Management Important Message (Signed)
Important Message  Patient Details  Name: Sarah Velasquez MRN: [id number] Date of Birth: [birth year]   Medicare Important Message Given:  Yes     Dannette Barbara 05/17/2020, 12:01 PM

## 2020-05-17 NOTE — Progress Notes (Signed)
Physical Therapy Treatment Patient Details Name: Sarah Velasquez MRN: [id number] DOB: [birth year] Today's Date: 05/17/2020    History of Present Illness Sarah Velasquez is a 67yoF who comes to [facility] 9/8 from home after a fall. Pt c/o Lt hip and shoulder pain. Imaging reveals Left hip fracture, left shoulder fracture/dislocation. Pt underwent Left femur ORIF on 9/9 c Dr. Rudene Christians c IM nailing. LLE now WBAT. 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    PT Comments    On commode upon arrival with tech.  Assisted with transfer back to recliner with cues to not use LUE.  To hallway. Able to walk 2 forward and 2 backwards along rail in hallway 48'  After seated rest, stood again for standing ex as below with LLE then walked 1 length of rail before fatigue.  Continues with clearing mentation and overall progressing well.  Son in and questions answered.   Follow Up Recommendations  SNF;Supervision/Assistance - 24 hour;Supervision for mobility/OOB     Equipment Recommendations       Recommendations for Other Services       Precautions / Restrictions Precautions Precautions: Fall Restrictions Weight Bearing Restrictions: Yes LUE Weight Bearing: Non weight bearing LLE Weight Bearing: Weight bearing as tolerated    Mobility  Bed Mobility               General bed mobility comments: in recliner before and after session  Transfers Overall transfer level: Needs assistance   Transfers: Sit to/from Stand;Stand Pivot Transfers Sit to Stand: Min assist Stand pivot transfers: Min assist       General transfer comment: pulls on rail in hallway  Ambulation/Gait Ambulation/Gait assistance: Min assist Gait Distance (Feet): 48 Feet Assistive device:  (rail) Gait Pattern/deviations: Step-to pattern Gait velocity: decreased   General Gait Details: 2 forward and backwards lengths of rail in hallway, 1 forward on second attempt  after standing ex.   Stairs             Wheelchair Mobility    Modified Rankin (Stroke Patients Only)       Balance Overall balance assessment: Needs assistance Sitting-balance support: Feet supported Sitting balance-Leahy Scale: Good     Standing balance support: Single extremity supported;During functional activity Standing balance-Leahy Scale: Fair                              Cognition Arousal/Alertness: Awake/alert Behavior During Therapy: WFL for tasks assessed/performed Overall Cognitive Status: History of cognitive impairments - at baseline                                 General Comments: more clear today.  able to recall me from yesterday and answers appropriately      Exercises Other Exercises Other Exercises: standing SLR, marches, ab/add and heel raises LLE x 10 at rail    General Comments        Pertinent Vitals/Pain Pain Assessment: Faces Faces Pain Scale: Hurts a little bit Pain Location: L hip Pain Descriptors / Indicators: Discomfort;Grimacing;Operative site guarding Pain Intervention(s): Limited activity within patient's tolerance;Repositioned    Home Living                      Prior Function            PT Goals (  current goals can now be found in the care plan section) Progress towards PT goals: Progressing toward goals    Frequency    BID      PT Plan Current plan remains appropriate    Co-evaluation              AM-PAC PT "6 Clicks" Mobility   Outcome Measure  Help needed turning from your back to your side while in a flat bed without using bedrails?: A Little Help needed moving from lying on your back to sitting on the side of a flat bed without using bedrails?: A Lot Help needed moving to and from a bed to a chair (including a wheelchair)?: A Lot Help needed standing up from a chair using your arms (e.g., wheelchair or bedside chair)?: A Lot Help needed to walk in hospital  room?: Total Help needed climbing 3-5 steps with a railing? : Total 6 Click Score: 11    End of Session Equipment Utilized During Treatment: Gait belt Activity Tolerance: Patient tolerated treatment well Patient left: in chair;with call bell/phone within reach;with chair alarm set Nurse Communication: Mobility status       Time: 6568-1275 PT Time Calculation (min) (ACUTE ONLY): 23 min  Charges:  $Gait Training: 8-22 mins $Therapeutic Exercise: 8-22 mins                    Chesley Noon, PTA 05/17/20, 2:43 PM

## 2020-05-17 NOTE — Progress Notes (Signed)
PROGRESS NOTE    Sarah Velasquez  BHA:[id number] DOB: Aug 11, 1946 DOA: 05/12/2020 PCP: Dion Body, MD   Chief complaint.  Hip pain. Brief Narrative:  Sarah Velasquez a 74 y.o.femalewith medical history significant ofhypertension, diabetes mellitus, GERD, depression, anxiety, vertigo, chronic bronchitis, who presents with fall and pain in left hip and shoulder. Patient had accidental fall at home, he sustained a left hip fracture. Scheduled for surgery by orthopedics.  9/9.Hip surgery with intramedullary nail performed.  9/10.Patient has transient hypotension, give fluid bolus. Very confused this morning, no agitation.  9/11. Patient had a near syncope episode today, will continue for another day offluids.  9/13.  Patient had a worsening confusion and agitation yesterday.  Was given Haldol, started Seroquel.  Required a sitter.  Condition better today.   Assessment & Plan:   Principal Problem:   Closed intertrochanteric fracture of hip, left, initial encounter [facility]) Active Problems:   Mixed hyperlipidemia   GERD   Chronic bronchitis ([place])   Fall at home, initial encounter   Closed fracture dislocation of left shoulder   Diabetes mellitus without complication ([place])   Hypotension   Delirium with dementia   Acute blood loss anemia  #1.  Left hip fracture. Postop day #4. Doing well from orthopedic standpoint.  2.  Delirium with Alzheimer dementia. Restarted Seroquel. Patient has a mild urinary retention, will send UA.  Bladder scan and as needed in and out catheter. Patient seemed to be more confused in the early morning and late afternoon.  Will follow closely.  3.  Urinary retention.  As above.  4.  Hypotension secondary dehydration. Resolved.  5.  Acute blood loss anemia. Stable  6.  Chronic bronchitis. Stable.  7.  Type 2 diabetes. Continue sliding scale insulin.    DVT prophylaxis: Lovenox Code Status:  Partial Family Communication: Updated  patient son.     Status is: Inpatient  Remains inpatient appropriate because:Inpatient level of care appropriate due to severity of illness   Dispo: The patient is from: Home              Anticipated d/c is to: SNF              Anticipated d/c date is: 1 day              Patient currently is not medically stable to d/c.        I/O last 3 completed shifts: In: -  Out: 1350 [Urine:1350] No intake/output data recorded.     Consultants:   Orthopedics.  Procedures: Hip surgery.  Antimicrobials:None  Subjective: Patient had a significant agitation and confusion yesterday, was given Haldol.  Patient was requiring a sitter in the room. She was started on Seroquel at home dose.  She has some confusion this morning, but no agitation. Denies any short of breath or cough. No abdominal pain or nausea vomiting.   Objective: Vitals:   05/16/20 2233 05/17/20 0100 05/17/20 0224 05/17/20 0737  BP: (!) 122/53 104/67 (!) 104/56 (!) 110/57  Pulse: (!) 106 (!) 103 89 86  Resp: 18 20 20 20   Temp: 99.1 F (37.3 C) 99.3 F (37.4 C)  99.1 F (37.3 C)  TempSrc: Oral   Oral  SpO2: 95% 96%  98%  Weight:      Height:       No intake or output data in the 24 hours ending 05/17/20 1005 Filed Weights   05/12/20 1201  Weight: 67.1 kg    Examination:  General exam: Appears calm and comfortable  Respiratory system: Clear to auscultation. Respiratory effort normal. Cardiovascular system: S1 & S2 heard, RRR. No JVD, murmurs, rubs, gallops or clicks. No pedal edema. Gastrointestinal system: Abdomen is nondistended, soft and nontender. No organomegaly or masses felt. Normal bowel sounds heard. Central nervous system: Alert and oriented x2. No focal neurological deficits. Extremities: Symmetric  Skin: No rashes, lesions or ulcers Psychiatry:  Mood & affect appropriate.     Data Reviewed: I have personally reviewed following labs and imaging studies  CBC: Recent Labs  Lab  05/12/20 1908 05/13/20 0554 05/14/20 0515 05/15/20 0504 05/16/20 0601  WBC 17.4* 12.0* 11.2* 8.3 8.8  NEUTROABS 15.6*  --   --  5.5 6.6  HGB 12.2 11.7* 9.1* 8.3* 8.1*  HCT 36.2 33.2* 26.4* 24.1* 23.3*  MCV 88.1 85.1 88.9 88.0 88.3  PLT 234 216 162 160 242   Basic Metabolic Panel: Recent Labs  Lab 05/12/20 1908 05/13/20 0554 05/14/20 0515 05/15/20 0504  NA 142 139 139 139  K 4.1 4.1 3.2* 3.9  CL 107 103 107 108  CO2 22 23 19* 22  GLUCOSE 170* 118* 128* 113*  BUN 21 20 13 10   CREATININE 0.90 0.92 0.77 0.64  CALCIUM 9.6 9.3 8.1* 8.3*  MG  --   --   --  1.8   GFR: Estimated Creatinine Clearance: 55.5 mL/min (by C-G formula based on SCr of 0.64 mg/dL). Liver Function Tests: No results for input(s): AST, ALT, ALKPHOS, BILITOT, PROT, ALBUMIN in the last 168 hours. No results for input(s): LIPASE, AMYLASE in the last 168 hours. No results for input(s): AMMONIA in the last 168 hours. Coagulation Profile: Recent Labs  Lab 05/12/20 1908  INR 1.1   Cardiac Enzymes: No results for input(s): CKTOTAL, CKMB, CKMBINDEX, TROPONINI in the last 168 hours. BNP (last 3 results) No results for input(s): PROBNP in the last 8760 hours. HbA1C: No results for input(s): HGBA1C in the last 72 hours. CBG: Recent Labs  Lab 05/16/20 1124 05/16/20 1640 05/16/20 1957 05/17/20 0216 05/17/20 0825  GLUCAP 109* 98 111* 137* 116*   Lipid Profile: No results for input(s): CHOL, HDL, LDLCALC, TRIG, CHOLHDL, LDLDIRECT in the last 72 hours. Thyroid Function Tests: No results for input(s): TSH, T4TOTAL, FREET4, T3FREE, THYROIDAB in the last 72 hours. Anemia Panel: Recent Labs    05/15/20 0504  TIBC 214*  IRON 18*   Sepsis Labs: No results for input(s): PROCALCITON, LATICACIDVEN in the last 168 hours.  Recent Results (from the past 240 hour(s))  SARS Coronavirus 2 by RT PCR (hospital order, performed in [facility] hospital lab) Nasopharyngeal Nasopharyngeal Swab     Status: None    Collection Time: 05/12/20  3:58 PM   Specimen: Nasopharyngeal Swab  Result Value Ref Range Status   SARS Coronavirus 2 NEGATIVE NEGATIVE Final    Comment: (NOTE) SARS-CoV-2 target nucleic acids are NOT DETECTED.  The SARS-CoV-2 RNA is generally detectable in upper and lower respiratory specimens during the acute phase of infection. The lowest concentration of SARS-CoV-2 viral copies this assay can detect is 250 copies / mL. A negative result does not preclude SARS-CoV-2 infection and should not be used as the sole basis for treatment or other patient management decisions.  A negative result may occur with improper specimen collection / handling, submission of specimen other than nasopharyngeal swab, presence of viral mutation(s) within the areas targeted by this assay, and inadequate number of viral copies (<250 copies / mL). A negative  result must be combined with clinical observations, patient history, and epidemiological information.  Fact Sheet for Patients:   StrictlyIdeas.no  Fact Sheet for Healthcare Providers: BankingDealers.co.za  This test is not yet approved or  cleared by the [place] FDA and has been authorized for detection and/or diagnosis of SARS-CoV-2 by FDA under an Emergency Use Authorization (EUA).  This EUA will remain in effect (meaning this test can be used) for the duration of the COVID-19 declaration under Section 564(b)(1) of the Act, 21 U.S.C. section 360bbb-3(b)(1), unless the authorization is terminated or revoked sooner.  Performed at [facility], [address] [place], [place] [address]   MRSA PCR Screening     Status: None   Collection Time: 05/13/20  7:34 AM   Specimen: Nasopharyngeal  Result Value Ref Range Status   MRSA by PCR NEGATIVE NEGATIVE Final    Comment:        The GeneXpert MRSA Assay (FDA approved for NASAL specimens only), is one component of a comprehensive MRSA  colonization surveillance program. It is not intended to diagnose MRSA infection nor to guide or monitor treatment for MRSA infections. Performed at [facility], [address], [place], [place] [address]          Radiology Studies: CT HEAD WO CONTRAST  Result Date: 05/15/2020 CLINICAL DATA:  Head trauma, minor. Possible head trauma. Unwitnessed fall. EXAM: CT HEAD WITHOUT CONTRAST TECHNIQUE: Contiguous axial images were obtained from the base of the skull through the vertex without intravenous contrast. COMPARISON:  Head CT 05/12/2020.  Brain MRI 12/10/2019. FINDINGS: Brain: Stable, mild to moderate generalized parenchymal atrophy. There is no acute intracranial hemorrhage. No demarcated cortical infarct. No extra-axial fluid collection. No evidence of intracranial mass. No midline shift. Vascular: No hyperdense vessel. Skull: Normal. Negative for fracture or focal lesion. Sinuses/Orbits: Visualized orbits show no acute finding. Mild ethmoid sinus mucosal thickening. No significant mastoid effusion. IMPRESSION: No evidence of acute intracranial abnormality. Stable mild-to-moderate generalized parenchymal atrophy. Mild ethmoid sinus mucosal thickening. Electronically Signed   By: Kellie Simmering DO   On: 05/15/2020 23:22        Scheduled Meds:  aspirin EC  81 mg Oral Daily   atorvastatin  80 mg Oral Daily   cholecalciferol  1,000 Units Oral Daily   docusate sodium  100 mg Oral BID   donepezil  10 mg Oral QHS   enoxaparin (LOVENOX) injection  40 mg Subcutaneous Q24H   escitalopram  5 mg Oral Daily   ezetimibe  10 mg Oral Daily   famotidine  20 mg Oral BID   feeding supplement (GLUCERNA SHAKE)  237 mL Oral TID BM   ferrous [id number] C-folic acid  1 capsule Oral BID PC   insulin aspart  0-5 Units Subcutaneous QHS   insulin aspart  0-9 Units Subcutaneous TID WC   lactulose  20 g Oral Once   loratadine  10 mg Oral Daily   multivitamin with  minerals  1 tablet Oral Daily   QUEtiapine  25 mg Oral QHS   Continuous Infusions:  sodium chloride 75 mL/hr at 05/16/20 0621     LOS: 5 days    Time spent: 28 minutes   Sharen Hones, MD [facility]   To contact the attending provider between 7A-7P or the covering provider during after hours 7P-7A, please log into the web site [email or web address] and access using universal [place] password for that web site. If you do not have the password, please call  the hospital operator.  05/17/2020, 10:05 AM

## 2020-05-17 NOTE — Progress Notes (Signed)
Post void residual via bladder scanner revealed no urine in bladder. Bladder scanning dc'd via verbal order Dr. Roosevelt Locks.

## 2020-05-17 NOTE — Progress Notes (Signed)
Physical Therapy Treatment Patient Details Name: Sarah Velasquez MRN: [id number] DOB: Jun 19, 1946 Today's Date: 05/17/2020    History of Present Illness Sarah Velasquez is a 16yoF who comes to [facility] 9/8 from home after a fall. Pt c/o Lt hip and shoulder pain. Imaging reveals Left hip fracture, left shoulder fracture/dislocation. Pt underwent Left femur ORIF on 9/9 c Dr. Rudene Christians c IM nailing. LLE now WBAT. 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    PT Comments    Pt ready for session.  Up to chair with sitter.  She is able to walk x 2 length of handrail in hallway with min a x 1.  She is fatigued with effort but able to double gait distance from yesterday.  She is overall clearer today and appropriate with conversation.  Son in and discussed at length recovery and rehab process.  Voiced understanding.     Follow Up Recommendations  SNF;Supervision/Assistance - 24 hour;Supervision for mobility/OOB     Equipment Recommendations       Recommendations for Other Services       Precautions / Restrictions Precautions Precautions: Fall Restrictions Weight Bearing Restrictions: Yes LUE Weight Bearing: Non weight bearing LLE Weight Bearing: Weight bearing as tolerated    Mobility  Bed Mobility               General bed mobility comments: in recliner before and after session  Transfers Overall transfer level: Needs assistance Equipment used:  (rail in hallway) Transfers: Sit to/from Stand Sit to Stand: [place] assist         General transfer comment: pulls on rail in hallway  Ambulation/Gait Ambulation/Gait assistance: Min assist Gait Distance (Feet): 12 Feet Assistive device:  (rail) Gait Pattern/deviations: Step-to pattern Gait velocity: decreased   General Gait Details: 2 trials today   Stairs             Wheelchair Mobility    Modified Rankin (Stroke Patients Only)       Balance Overall  balance assessment: Needs assistance Sitting-balance support: Feet supported Sitting balance-Leahy Scale: Good     Standing balance support: Single extremity supported;During functional activity Standing balance-Leahy Scale: Poor                              Cognition Arousal/Alertness: Awake/alert Behavior During Therapy: WFL for tasks assessed/performed Overall Cognitive Status: History of cognitive impairments - at baseline                                 General Comments: more clear today.  able to recall me from yesterday and answers appropriately      Exercises      General Comments        Pertinent Vitals/Pain Pain Assessment: Faces Faces Pain Scale: Hurts little more Pain Location: L hip Pain Descriptors / Indicators: Discomfort;Grimacing;Operative site guarding Pain Intervention(s): Limited activity within patient's tolerance;Monitored during session;Repositioned    Home Living                      Prior Function            PT Goals (current goals can now be found in the care plan section) Progress towards PT goals: Progressing toward goals    Frequency    BID  PT Plan Current plan remains appropriate    Co-evaluation              AM-PAC PT "6 Clicks" Mobility   Outcome Measure  Help needed turning from your back to your side while in a flat bed without using bedrails?: A Little Help needed moving from lying on your back to sitting on the side of a flat bed without using bedrails?: A Lot Help needed moving to and from a bed to a chair (including a wheelchair)?: A Lot Help needed standing up from a chair using your arms (e.g., wheelchair or bedside chair)?: A Lot Help needed to walk in hospital room?: A Lot Help needed climbing 3-5 steps with a railing? : Total 6 Click Score: 12    End of Session Equipment Utilized During Treatment: Gait belt Activity Tolerance: Patient tolerated treatment  well Patient left: in chair;with call bell/phone within reach;with chair alarm set Nurse Communication: Mobility status       Time: 7185-5015 PT Time Calculation (min) (ACUTE ONLY): 23 min  Charges:  $Gait Training: 23-37 mins                    Chesley Noon, PTA 05/17/20, 9:59 AM

## 2020-05-18 LAB — GLUCOSE, CAPILLARY
Glucose-Capillary: 125 mg/dL — ABNORMAL HIGH (ref 70–99)
Glucose-Capillary: 198 mg/dL — ABNORMAL HIGH (ref 70–99)
Glucose-Capillary: 112 mg/dL — ABNORMAL HIGH (ref 70–99)
Glucose-Capillary: 135 mg/dL — ABNORMAL HIGH (ref 70–99)

## 2020-05-18 MED ORDER — OXYCODONE-ACETAMINOPHEN 5-325 MG PO TABS: 0.5000 | ORAL_TABLET | ORAL | Status: DC | PRN

## 2020-05-18 MED ORDER — MELATONIN 5 MG PO TABS
2.5000 mg | ORAL_TABLET | Freq: Every day | ORAL | Status: DC
  Administered 2020-05-18: 2.5 mg via ORAL
  Filled 2020-05-18: qty 1

## 2020-05-18 MED ORDER — HALOPERIDOL LACTATE 5 MG/ML IJ SOLN
2.5000 mg | Freq: Once | INTRAMUSCULAR | Status: AC
  Administered 2020-05-18: 2.5 mg via INTRAMUSCULAR
  Filled 2020-05-18: qty 1

## 2020-05-18 MED ORDER — HALOPERIDOL LACTATE 5 MG/ML IJ SOLN: 1.0000 mg | Freq: Four times a day (QID) | INTRAMUSCULAR | Status: DC | PRN

## 2020-05-18 NOTE — Progress Notes (Signed)
OT Cancellation Note  Patient Details Name: Sarah Velasquez MRN: [id number] DOB: Oct 14, 1945   Cancelled Treatment:    Reason Eval/Treat Not Completed: Other (comment). Pt with MD upon attempt to treat. Will re-attempt at later time as appropriate.   Jeni Salles, MPH, MS, OTR/L ascom [phone] 05/18/20, 9:45 AM

## 2020-05-18 NOTE — Progress Notes (Signed)
Subjective: 5 Days Post-Op Procedure(s) (LRB): INTRAMEDULLARY (IM) NAIL INTERTROCHANTRIC (Left) Patient reports pain as moderate. Resting and calm this am. Agitated last night, haldol given. We will continue therapy today. Ambulated 66 ft yesterday Plan is to go Skilled nursing facility after hospital stay.   Objective: Vital signs in last 24 hours: Temp:  [97.7 F (36.5 C)-98.6 F (37 C)] 98.4 F (36.9 C) (09/14 0809) Pulse Rate:  [74-87] 78 (09/14 0809) Resp:  [16-18] 18 (09/14 0809) BP: (95-113)/(44-71) 101/58 (09/14 0809) SpO2:  [94 %-99 %] 98 % (09/14 0809)  Intake/Output from previous day: 09/13 0701 - 09/14 0700 In: 480 [P.O.:480] Out: -  Intake/Output this shift: No intake/output data recorded.  Recent Labs    05/16/20 0601  HGB 8.1*   Recent Labs    05/16/20 0601  WBC 8.8  RBC 2.64*  HCT 23.3*  PLT 172   No results for input(s): NA, K, CL, CO2, BUN, CREATININE, GLUCOSE, CALCIUM in the last 72 hours. No results for input(s): LABPT, INR in the last 72 hours.  EXAM General - Patient is Alert and Confused  Left upper extremity -minimal swelling throughout the left upper extremity.  Neurovascular intact.  No deformity Left lower extremity - Neurovascular intact Sensation intact distally Intact pulses distally Dorsiflexion/Plantar flexion intact No cellulitis present Compartment soft Dressing - dressing C/D/I and no drainage Motor Function - intact, moving foot and toes well on exam.   Past Medical History:  Diagnosis Date   Anxiety    Chest pain 02/15-16/2009   Hospital [facility]  chest pain rule out //stress Myoview negative 10/21/2007//Stress cardiolite ;apical thinning ,negative ischemia (09/14/2003)   Diabetes mellitus    typeII   Diverticulosis of colon 09/2003   colonoscopy ext.and internal hemorrhoids (Dr. Vira Agar )12/03/2006// colonoscopy polyps multiple  benign; interal hemorrhoids ,divertics 09/16/2003   Drug-induced constipation     Environmental allergies    GERD (gastroesophageal reflux disease)    EGD hiatal hernia; esophagitis/sigmoidoscopy WNL (Dr. Vira Agar) 03/04/1996: ABD. U/S  WNL (06/1996)//EGD multiple polyps ;small H.H (09/16/2003)   H/O hiatal hernia    Hyperlipidemia 05/1995   Osteopenia 12/2011   spine -1.8, hip -0.7   PONV (postoperative nausea and vomiting)    Right lumbar radiculitis 2013   DDD, spondylosis, L3 radiculitis (MRI 07/2012) - ESI 09/2011 (Dr Sharlet Salina)    Vertigo    hx of    Assessment/Plan:   5 Days Post-Op Procedure(s) (LRB): INTRAMEDULLARY (IM) NAIL INTERTROCHANTRIC (Left) Principal Problem:   Closed intertrochanteric fracture of hip, left, initial encounter [facility]) Active Problems:   Mixed hyperlipidemia   GERD   Chronic bronchitis ([place])   Fall at home, initial encounter   Closed fracture dislocation of left shoulder   Diabetes mellitus without complication ([place])   Hypotension   Delirium with dementia   Acute blood loss anemia  Estimated body mass index is 24.63 kg/m as calculated from the following:   Height as of this encounter: 5\' 5"  (1.651 m).   Weight as of this encounter: 67.1 kg. Advance diet Up with therapy, weightbearing as tolerated left lower extremity. Labs and vital signs are stable Hemoglobin 8.1, stable    Follow-up with [facility] in 2 weeks for x-rays of the left shoulder and staple removal of the left hip TED hose bilateral lower extremity x6 weeks Lovenox 40 mg subcu daily x14 days    DVT Prophylaxis - Lovenox, TED hose and SCDs Weight-Bearing as tolerated to left leg   T. Rachelle Hora, PA-C  [place] 05/18/2020, 9:24 AM

## 2020-05-18 NOTE — Progress Notes (Signed)
Physical Therapy Treatment Patient Details Name: Sarah Velasquez MRN: [id number] DOB: 27-Jun-1946 Today's Date: 05/18/2020    History of Present Illness Sarah Velasquez is a 60yoF who comes to [facility] 9/8 from home after a fall. Pt c/o Lt hip and shoulder pain. Imaging reveals Left hip fracture, left shoulder fracture/dislocation. Pt underwent Left femur ORIF on 9/9 c Dr. Rudene Christians c IM nailing. LLE now WBAT. 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    PT Comments    Pt asleep in recliner upon entry, easily awakened, but requires reawaken stimuli periodically in session, also VC to maintain eyes open. Pt apologetic for sleepy, Pryor Curia provides empathy, encouragement.  Pt walked throughout LE strengthening exercise and transfers training/strengthening. Pt still requires minA from standard surface height. Pt requires min-modA of limb for Left SLR and heel slide. Pt made comfortable at end of session, needs met.    Follow Up Recommendations  SNF;Supervision/Assistance - 24 hour;Supervision for mobility/OOB     Equipment Recommendations  Other (comment) (appears to do well with HW, but will need additional training)    Recommendations for Other Services       Precautions / Restrictions Precautions Precautions: Fall Restrictions Weight Bearing Restrictions: Yes LUE Weight Bearing: Non weight bearing LLE Weight Bearing: Weight bearing as tolerated    Mobility  Bed Mobility               General bed mobility comments: in recliner before and after session  Transfers Overall transfer level: Needs assistance Equipment used: 1 person hand held assist Transfers: Sit to/from Stand Sit to Stand: Min assist         General transfer comment: Cueing for safety and WB precautions required t/o.  Ambulation/Gait Ambulation/Gait assistance: Min assist;Min guard Gait Distance (Feet): 50 Feet Assistive device: Hemi-walker Gait  Pattern/deviations: Step-to pattern     General Gait Details: 3-point step-to gait c HW, author gives intermittent cues and minA for HW adjustment, largely minGuard, with minA twice for balance recovery; pt much more fatigued this date; has RUE fatigue from heavy lean on HW.   Stairs             Wheelchair Mobility    Modified Rankin (Stroke Patients Only)       Balance Overall balance assessment: Needs assistance Sitting-balance support: Feet supported Sitting balance-Leahy Scale: Good Sitting balance - Comments: Steady static sitting, reaching within BOS.   Standing balance support: Single extremity supported;During functional activity Standing balance-Leahy Scale: Fair                              Cognition Arousal/Alertness: Awake/alert Behavior During Therapy: WFL for tasks assessed/performed Overall Cognitive Status: Within Functional Limits for tasks assessed                                 General Comments: Generally oriented to self and place. At end of session appears to be come marginally more confused. Asks, "am I wearing clothes?" and states, "I can't remember if my husband is alive. Isn't that terrible". Therapeutic use of self utilized t/o session to provide encouragement, redirection, and reassurance. Pt remains pleasant t/o session. RN notified/aware.      Exercises General Exercises - Lower Extremity Ankle Circles/Pumps: AROM;15 reps;Both Long Arc Quad: AROM;Both;15 reps;AAROM Heel Slides: AAROM;Both;Supine;15 reps Hip ABduction/ADduction: AAROM;Both;15  reps;Supine Straight Leg Raises: AAROM;Both;10 reps;Supine Other Exercises Other Exercises: STS from recliner + pillow: 1x10 (first 5x c minGuardA, 2nd 5x c minA) Other Exercises: OT facilitates UB bathing & grooming tasks, don shoulder sling, sitting balance/tolerance, & STS with cueing for safety, sequencing, and WB status provided t/o session. See ADL section for  additional detail regarding occupational performance.    General Comments        Pertinent Vitals/Pain Pain Assessment: Faces Faces Pain Scale: Hurts little more Pain Location: LUE, L Hip Pain Descriptors / Indicators: Discomfort;Grimacing;Operative site guarding Pain Intervention(s): Limited activity within patient's tolerance;Monitored during session    Home Living                      Prior Function            PT Goals (current goals can now be found in the care plan section) Acute Rehab PT Goals Patient Stated Goal: return to PLOF PT Goal Formulation: With family Time For Goal Achievement: 05/28/20 Potential to Achieve Goals: Fair Progress towards PT goals: Progressing toward goals    Frequency    BID      PT Plan Current plan remains appropriate    Co-evaluation              AM-PAC PT "6 Clicks" Mobility   Outcome Measure  Help needed turning from your back to your side while in a flat bed without using bedrails?: A Little Help needed moving from lying on your back to sitting on the side of a flat bed without using bedrails?: A Little Help needed moving to and from a bed to a chair (including a wheelchair)?: A Little Help needed standing up from a chair using your arms (e.g., wheelchair or bedside chair)?: A Little Help needed to walk in hospital room?: A Lot Help needed climbing 3-5 steps with a railing? : A Lot 6 Click Score: 16    End of Session Equipment Utilized During Treatment: Gait belt Activity Tolerance: Patient limited by fatigue;Patient limited by pain Patient left: in chair;with call bell/phone within reach;with chair alarm set Nurse Communication: Mobility status PT Visit Diagnosis: Unsteadiness on feet (R26.81);Other abnormalities of gait and mobility (R26.89);Muscle weakness (generalized) (M62.81);Difficulty in walking, not elsewhere classified (R26.2)     Time: 2703-5009 PT Time Calculation (min) (ACUTE ONLY): 15  min  Charges:  $Gait Training: 8-22 mins $Therapeutic Exercise: 8-22 mins                     4:40 PM, 05/18/20 Etta Grandchild, PT, DPT Physical Therapist - [facility]  [phone] ([place])    [name] C 05/18/2020, 4:37 PM

## 2020-05-18 NOTE — Progress Notes (Signed)
Occupational Therapy Treatment Patient Details Name: Sarah Velasquez MRN: [id number] DOB: 1945/12/02 Today's Date: 05/18/2020    History of present illness Shardea Cwynar is a 30yoF who comes to [facility] 9/8 from home after a fall. Pt c/o Lt hip and shoulder pain. Imaging reveals Left hip fracture, left shoulder fracture/dislocation. Pt underwent Left femur ORIF on 9/9 c Dr. Rudene Christians c IM nailing. LLE now WBAT. 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   OT comments  Ms. Nolon Rod seen for OT treatment this date. Upon arrival to room pt resting in room recliner, appears to be sleeping, but wakes to VCs. She is agreeable to OT tx session, and denies pain while at rest. Pt eager to participate in ADL management and engages in seated UB grooming and bathing tasks as described below (See ADL section). She requires consistent cueing t/o ADL management to maintain LUE NWB status as well as MOD A for UB bathing, and MAX A to doff/don sling this date. OT educates pt on safety, compensatory bathing and grooming strategies, and one handed techniques t/o session. Pt return verbalizes understanding, but requires moderate cueing during functional implementation of compensatory strategies. Pt cognition appears to fluctuate during session. While pt remains oriented to self, place, and limited situation, she asks this author, "Am I wearing clothes?" and states "I can't remember if my husband is alive." She remains pleasant t/o session and follows VCs consistently. She is easily redirectable/consolable, but remains confused at end of session. RN notified.  Pt is making good progress toward goals and continues to benefit from skilled OT services to maximize return to PLOF and minimize risk of future falls, injury, caregiver burden, and readmission. Will continue to follow POC as written. Discharge recommendation remains appropriate.    Follow Up Recommendations   SNF;Supervision/Assistance - 24 hour    Equipment Recommendations   (TBD at next venue of care.)    Recommendations for Other Services      Precautions / Restrictions Precautions Precautions: Fall Restrictions Weight Bearing Restrictions: Yes LUE Weight Bearing: Non weight bearing LLE Weight Bearing: Weight bearing as tolerated       Mobility Bed Mobility               General bed mobility comments: in recliner before and after session  Transfers Overall transfer level: Needs assistance Equipment used: 1 person hand held assist (R hand held) Transfers: Sit to/from Stand Sit to Stand: Min guard         General transfer comment: Cueing for safety and WB precautions required t/o.    Balance Overall balance assessment: Needs assistance Sitting-balance support: Feet supported Sitting balance-Leahy Scale: Good Sitting balance - Comments: Steady static sitting, reaching within BOS.   Standing balance support: Single extremity supported;During functional activity Standing balance-Leahy Scale: Fair                             ADL either performed or assessed with clinical judgement   ADL Overall ADL's : Needs assistance/impaired     Grooming: Wash/dry face;Oral care;Brushing hair;Sitting;Set up;Supervision/safety;Cueing for safety Grooming Details (indicate cue type and reason): Min cueing required throughout to maintain LUE NWB status. Upper Body Bathing: Moderate assistance;Sitting;Cueing for compensatory techniques;Cueing for safety;Cueing for sequencing Upper Body Bathing Details (indicate cue type and reason): Pt requires consistent cueing and education to support maintenance of LUE NWB status. OT facilitates education on one  handed bathing techniques and compensatory strategies. Pt ultimately requires MOD A for assist primarily to wash RUE/R shoulder and back this date.                         Functional mobility during ADLs: Min  guard;Cueing for safety;Cueing for sequencing General ADL Comments: Continue to anticipate MAX A for LB ADL mgt including bathing and dressing in seated position. Pt requires MAX A to adjust sling this date.     Vision Baseline Vision/History: Wears glasses Wears Glasses: At all times Patient Visual Report: No change from baseline     Perception     Praxis      Cognition Arousal/Alertness: Awake/alert Behavior During Therapy: WFL for tasks assessed/performed Overall Cognitive Status: History of cognitive impairments - at baseline                                 General Comments: Generally oriented to self and place. At end of session appears to be come marginally more confused. Asks, "am I wearing clothes?" and states, "I can't remember if my husband is alive. Isn't that terrible". Therapeutic use of self utilized t/o session to provide encouragement, redirection, and reassurance. Pt remains pleasant t/o session. RN notified/aware.        Exercises General Exercises - Lower Extremity Long Arc Quad: AROM;Both;15 reps;Seated Other Exercises Other Exercises: OT facilitates UB bathing & grooming tasks, don shoulder sling, sitting balance/tolerance, & STS with cueing for safety, sequencing, and WB status provided t/o session. See ADL section for additional detail regarding occupational performance.   Shoulder Instructions       General Comments      Pertinent Vitals/ Pain       Faces Pain Scale: Hurts little more Pain Location: LUE, L Hip Pain Descriptors / Indicators: Discomfort;Grimacing;Operative site guarding Pain Intervention(s): Limited activity within patient's tolerance;Monitored during session;Repositioned  Home Living                                          Prior Functioning/Environment              Frequency  Min 2X/week        Progress Toward Goals  OT Goals(current goals can now be found in the care plan section)   Progress towards OT goals: Progressing toward goals  Acute Rehab OT Goals Patient Stated Goal: return to PLOF OT Goal Formulation: With patient/family Time For Goal Achievement: 05/29/20 Potential to Achieve Goals: Good  Plan Discharge plan remains appropriate;Frequency remains appropriate    Co-evaluation                 AM-PAC OT "6 Clicks" Daily Activity     Outcome Measure   Help from another person eating meals?: A Little Help from another person taking care of personal grooming?: A Little Help from another person toileting, which includes using toliet, bedpan, or urinal?: A Lot Help from another person bathing (including washing, rinsing, drying)?: A Lot Help from another person to put on and taking off regular upper body clothing?: A Lot Help from another person to put on and taking off regular lower body clothing?: A Lot 6 Click Score: 14    End of Session Equipment Utilized During Treatment: Gait belt  OT Visit Diagnosis: Other abnormalities  of gait and mobility (R26.89);Muscle weakness (generalized) (M62.81);History of falling (Z91.81)   Activity Tolerance Patient tolerated treatment well   Patient Left in chair;with call bell/phone within reach;with chair alarm set   Nurse Communication Mobility status;Other (comment) (Cognition t/o session.)        Time: 4699-7802 OT Time Calculation (min): 29 min  Charges: OT General Charges $OT Visit: 1 Visit OT Treatments $Self Care/Home Management : 23-37 mins  Shara Blazing, M.S., OTR/L Ascom: [phone] 05/18/20, 4:03 PM

## 2020-05-18 NOTE — Consult Note (Signed)
NEUROLOGY CONSULTATION NOTE   Date of service: May 18, 2020 Patient Name: Sarah Velasquez MRN:  [id number] DOB:  1946/06/18 Reason for consult: "agitation and delirium"  History of Present Illness  Sarah Velasquez is a 74 y.o. 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  After surgery, patient was noted to have worsening agitation and delirium over a couple of days for which neurology was consulted.  I spoke with patient and her son Legrand Como at the bedside. Patient was asleep but she wakes up appropriately and is pleasant and conversant. On discussion, it appears that patient has periods of agitation but is otherwise either sleepy or herself.  Family worried that patient might not be able to go to inpatient rehab if this continues to be a problem. Legrand Como reports that he got a call from staff at [facility] last night about her agitation. Patient did not sleep a lot overnight. She is more calm this morning.  Patient denies any headache, no neck tenderness. No fever, no chills.   ROS   Constitutional Denies weight loss, fever and chills.  HEENT Denies changes in vision and hearing.  Respiratory Denies SOB and cough.  CV Denies palpitations and CP  GI Denies abdominal pain, nausea, vomiting and diarrhea.  GU Denies dysuria and urinary frequency.  MSK Denies myalgia and joint pain.  Skin Denies rash and pruritus.  Neurological Denies headache and syncope.  Psychiatric Denies recent changes in mood. Denies anxiety and depression.   Past History   Past Medical History:  Diagnosis Date  . Anxiety   . Chest pain 02/15-16/2009   Hospital [facility]  chest pain rule out //stress Myoview negative 10/21/2007//Stress  cardiolite ;apical thinning ,negative ischemia (09/14/2003)  . Diabetes mellitus    typeII  . Diverticulosis of colon 09/2003   colonoscopy ext.and internal hemorrhoids (Dr. Vira Agar )12/03/2006// colonoscopy polyps multiple  benign; interal hemorrhoids ,divertics 09/16/2003  . Drug-induced constipation   . Environmental allergies   . GERD (gastroesophageal reflux disease)    EGD hiatal hernia; esophagitis/sigmoidoscopy WNL (Dr. Vira Agar) 03/04/1996: ABD. U/S  WNL (06/1996)//EGD multiple polyps ;small H.H (09/16/2003)  . H/O hiatal hernia   . Hyperlipidemia 05/1995  . Osteopenia 12/2011   spine -1.8, hip -0.7  . PONV (postoperative nausea and vomiting)   . Right lumbar radiculitis 2013   DDD, spondylosis, L3 radiculitis (MRI 07/2012) - ESI 09/2011 (Dr Sharlet [place])   . Vertigo    hx of   Past Surgical History:  Procedure Laterality Date  . ABDOMINAL HYSTERECTOMY  1986   ovaries intact Uterine prolapse  . COLONOSCOPY  02/2014   diverticulosis, int hem, rpt 5 yrs Vira Agar)  . COLONOSCOPY W/ POLYPECTOMY    . DEXA  12/2011   spine -1.8, hip -0.7  . ESI  09/2011   ([place], [place])  . HAND SURGERY  01/23/2002    Three fingers amputated.  Lawnmower   accident.  . INTRAMEDULLARY (IM) NAIL INTERTROCHANTERIC Left 05/13/2020   Procedure: INTRAMEDULLARY (IM) NAIL INTERTROCHANTRIC;  Surgeon: Hessie Knows, MD;  Location: [facility];  Service: Orthopedics;  Laterality: Left;  . LUMBAR LAMINECTOMY/DECOMPRESSION MICRODISCECTOMY Right 11/06/2012   LUMBAR LAMINECTOMY/DECOMPRESSION MICRODISCECTOMY 1 LEVEL;  Surgeon: Eustace Moore, MD;  Right lumbar three-four extraforaminal diskectomy  . NASAL  SINUS SURGERY  1978  . TRIGGER FINGER RELEASE Left 04/20/2015   Procedure: LEFT THUMB TRIGGER FINGER RELEASE;  Surgeon: Milly Jakob, MD;  Location: [facility];  Service: Orthopedics;  Laterality: Left;  . TUBAL LIGATION    . VAGINAL DELIVERY     NSVD x 2   Family History  Problem Relation Age of Onset   . Heart disease Mother 51       MI  . COPD Father        emphysema  . Cancer Father        esophageal and brain cancer  . Heart disease Father 78       CABG  . Alcohol abuse Sister 52       etoh  . Heart disease Brother 72       MI  . Breast cancer Neg Hx    Social History   Socioeconomic History  . Marital status: Married    Spouse name: Not on file  . Number of children: Not on file  . Years of education: Not on file  . Highest education level: Not on file  Occupational History  . Not on file  Tobacco Use  . Smoking status: Never Smoker  . Smokeless tobacco: Never Used  Substance and Sexual Activity  . Alcohol use: Yes    Alcohol/week: 0.0 standard drinks    Comment: rarely - wine  . Drug use: No  . Sexual activity: Never  Other Topics Concern  . Not on file  Social History Narrative   Lives with husband - takes care of him (h/o FTD).   Occ: Works part time at daycare   Activity: no regular exercise   Diet: good water, fruits/vegetables daily   Social Determinants of Health   Financial Resource Strain:   . Difficulty of Paying Living Expenses: Not on file  Food Insecurity:   . Worried About Charity fundraiser in the Last Year: Not on file  . Ran Out of Food in the Last Year: Not on file  Transportation Needs:   . Lack of Transportation (Medical): Not on file  . Lack of Transportation (Non-Medical): Not on file  Physical Activity:   . Days of Exercise per Week: Not on file  . Minutes of Exercise per Session: Not on file  Stress:   . Feeling of Stress : Not on file  Social Connections:   . Frequency of Communication with Friends and Family: Not on file  . Frequency of Social Gatherings with Friends and Family: Not on file  . Attends Religious Services: Not on file  . Active Member of Clubs or Organizations: Not on file  . Attends Archivist Meetings: Not on file  . Marital Status: Not on file   Allergies  Allergen Reactions  . Fish  Allergy Anaphylaxis  . Shellfish Allergy Anaphylaxis  . Azithromycin     REACTION: rash  . Meperidine Hcl     REACTION: nausea  . Neurontin [Gabapentin] Other (See Comments)    Incoherent  . Omeprazole     REACTION: rash ([facility] brand)    Medications   Medications Prior to Admission  Medication Sig Dispense Refill Last Dose  . aspirin 81 MG tablet Take 81 mg by mouth daily.   Past Week at Unknown time  . atorvastatin (LIPITOR) 80 MG tablet Take 1 tablet (80 mg total) by mouth daily. 30 tablet 11 Past Week at Unknown time  . busPIRone (BUSPAR) 10 MG tablet  Take 10 mg by mouth 2 (two) times daily.   Past Week at Unknown time  . cetirizine (ZYRTEC ALLERGY) 10 MG tablet Take 10 mg by mouth daily.     Past Week at Unknown time  . docusate sodium (COLACE) 100 MG capsule Take 200 mg by mouth daily.    Past Week at Unknown time  . donepezil (ARICEPT) 10 MG tablet Take 10 mg by mouth at bedtime.   Past Week at Unknown time  . escitalopram (LEXAPRO) 10 MG tablet Take 10 mg by mouth daily.    Past Week at Unknown time  . fluticasone (FLONASE) 50 MCG/ACT nasal spray Place 2 sprays into the nose daily.   Past Week at Unknown time  . lisinopril (ZESTRIL) 2.5 MG tablet Take 2.5 mg by mouth daily.   Past Week at Unknown time  . metFORMIN (GLUCOPHAGE) 500 MG tablet Take 500 mg by mouth 2 (two) times daily.   Past Week at Unknown time  . QUEtiapine (SEROQUEL) 25 MG tablet Take 25 mg by mouth at bedtime.   Past Week at Unknown time  . glucose blood (ONE TOUCH ULTRA TEST) test strip 1 each by Other route daily. Use to check sugar daily Dx: E11.9 **ONE TOUCH ULTRA BLUE** 100 each 3   . ONETOUCH DELICA LANCETS 58N MISC USE ONE LANCET ONCE DAILY AS DIRECTED 100 each 0      Vitals  Temp:  [97.7 F (36.5 C)-98.6 F (37 C)] 98.4 F (36.9 C) (09/14 0809) Pulse Rate:  [74-87] 78 (09/14 0809) Resp:  [16-18] 18 (09/14 0809) BP: (95-113)/(44-71) 101/58 (09/14 0809) SpO2:  [94 %-99 %] 98 % (09/14 0809)  Body  mass index is 24.63 kg/m.  Physical Exam   General: Laying comfortably in bed; in no acute distress.  HENT: Normal oropharynx and mucosa. Normal external appearance of ears and nose. Neck: Supple, no pain or tenderness CV: No JVD. No peripheral edema. Pulmonary: Symmetric Chest rise. Normal respiratory effort. Abdomen: Soft to touch, non-tender Ext: No cyanosis, edema, or deformity  Skin: No rash. Normal palpation of skin.   Musculoskeletal: R hand deformity. No clubbing.  Neurologic Examination  Mental status/Cognition: Alert, oriented to self, place, month and year, good attention. Speech/language: Fluent, comprehension intact, object naming intact. Cranial nerves:   CN II Pupils equal and reactive to light, no VF deficits   CN III,IV,VI EOM intact, no gaze preference or deviation, no nystagmus   CN V normal sensation in V1, V2, and V3 segments bilaterally   CN VII no asymmetry, no nasolabial fold flattening   CN VIII normal hearing to speech   CN IX & X normal palatal elevation, no uvular deviation   CN XI 5/5 head turn and 5/5 shoulder shrug bilaterally   CN XII midline tongue protrusion   Motor:  Muscle bulk: normal, tone normal. Given L shoulder dislocation and recent L hip surgery, full motor strength testing was not performed. She is able squeeze my hands BL with a 5/5 strength and she is able to wiggle her toes BL and lift R leg off the bed.  Reflexes:  Right Left Comments  Pectoralis      Biceps (C5/6) 2    Brachioradialis (C5/6) 2     Triceps (C6/7) 2     Patellar (L3/4) 2 2    Achilles (S1) 1 1    Hoffman      Plantar withdraws withdraws   Jaw jerk    Sensation:  Light touch Intact  throughout   Pin prick    Temperature    Vibration   Proprioception    Coordination/Complex Motor:  - Finger to Nose intact on the R - Rapid alternating movement are normal - Gait: Deferred.  Labs   Lab Results  Component Value Date   NA 139 05/15/2020   K 3.9  05/15/2020   CL 108 05/15/2020   CO2 22 05/15/2020   GLUCOSE 113 (H) 05/15/2020   BUN 10 05/15/2020   CREATININE 0.64 05/15/2020   CALCIUM 8.3 (L) 05/15/2020   ALBUMIN 4.4 06/03/2015   AST 18 06/03/2015   ALT 12 06/03/2015   ALKPHOS 54 06/03/2015   BILITOT 0.7 06/03/2015   GFRNONAA >60 05/15/2020   GFRAA >60 05/15/2020     Imaging and Diagnostic studies  CTH w/o Contrast: No evidence of acute intracranial abnormality. Stable mild-to-moderate generalized parenchymal atrophy. Mild ethmoid sinus mucosal thickening.  CXR: IMPRESSION: 1. No acute cardiopulmonary findings. 2. Left shoulder fracture dislocation.  UA: results reviewed and not concerning for a UTI.  CBC with anemia.  Impression   LISL SLINGERLAND is a 74 y.o. female [place] significant for HTN, DM2, GERD, depression, anxiety, vertigo, chronic bronchitis, amnestic cognitive impairment who presents with fall with L hip fx s/p surgery. Her neurologic examination is notable for executive dysfunction but no focal deficit. No signs of CNS infection. Suspect that her delirium is likely happened in the setting of hospitalization.  Recommendations  - Would recommend obtaining an EKG before considering adding or uptitrating antipsychotics. - Continue seroquel at bedtime. - Continue multivitamin - I ordered melatonin 3mg  qhs - I ordered delirium precautions. ______________________________________________________________________   Thank you for the opportunity to take part in the care of this patient. If you have any further questions, please contact the neurology consultation attending.  Signed,  [place] Pager Number [phone]

## 2020-05-18 NOTE — Progress Notes (Signed)
Physical Therapy Treatment Patient Details Name: Sarah Velasquez MRN: [id number] DOB: 04/28/1946 Today's Date: 05/18/2020    History of Present Illness Sarah Velasquez is a 54yoF who comes to [facility] 9/8 from home after a fall. Pt c/o Lt hip and shoulder pain. Imaging reveals Left hip fracture, left shoulder fracture/dislocation. Pt underwent Left femur ORIF on 9/9 c Dr. Rudene Christians c IM nailing. LLE now WBAT. 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    PT Comments    Pt in chair at entry, son in room. Pt awake, agreeable to participate. minA to minguard for transfers, with high effort required to rise and delay establishment of standing balance. Standing BP stable, no orthostasis, no dizziness. Pt uses HW with intermittent cues for technique, pt fairly successful but is limited by general fatigue and RUE fatigue. Pt still requires heavy cues to avoid LUE functional use. Pt set up in chair at end of session, all needs met.    Follow Up Recommendations  SNF;Supervision/Assistance - 24 hour;Supervision for mobility/OOB     Equipment Recommendations  Other (comment) (appears to do well with HW, but will need additional training)    Recommendations for Other Services       Precautions / Restrictions Precautions Precautions: Fall Restrictions LUE Weight Bearing: Non weight bearing LLE Weight Bearing: Weight bearing as tolerated    Mobility  Bed Mobility               General bed mobility comments: in recliner before and after session  Transfers Overall transfer level: Needs assistance   Transfers: Sit to/from Stand Sit to Stand: Min guard         General transfer comment: very heavy effort, fluctuating success, inconsistent strategies  Ambulation/Gait Ambulation/Gait assistance: Min assist;Min guard Gait Distance (Feet): 50 Feet Assistive device: Hemi-walker Gait Pattern/deviations: Step-to pattern     General  Gait Details: 3-point step-to gait c HW, author gives intermittent cues and minA for HW adjustment, largely minGuard, with minA twice for balance recovery; pt much more fatigued this date; has RUE fatigue from heavy lean on HW.   Stairs             Wheelchair Mobility    Modified Rankin (Stroke Patients Only)       Balance                                            Cognition Arousal/Alertness: Awake/alert Behavior During Therapy: WFL for tasks assessed/performed Overall Cognitive Status: History of cognitive impairments - at baseline                                        Exercises General Exercises - Lower Extremity Long Arc Quad: AROM;Both;15 reps;Seated    General Comments        Pertinent Vitals/Pain      Home Living                      Prior Function            PT Goals (current goals can now be found in the care plan section) Acute Rehab PT Goals Patient Stated Goal: return to PLOF PT Goal Formulation: With family Time For Goal  Achievement: 05/28/20 Potential to Achieve Goals: Fair Progress towards PT goals: Progressing toward goals    Frequency    BID      PT Plan Current plan remains appropriate    Co-evaluation              AM-PAC PT "6 Clicks" Mobility   Outcome Measure  Help needed turning from your back to your side while in a flat bed without using bedrails?: A Little Help needed moving from lying on your back to sitting on the side of a flat bed without using bedrails?: A Lot Help needed moving to and from a bed to a chair (including a wheelchair)?: A Lot Help needed standing up from a chair using your arms (e.g., wheelchair or bedside chair)?: A Lot Help needed to walk in hospital room?: Total Help needed climbing 3-5 steps with a railing? : Total 6 Click Score: 11    End of Session Equipment Utilized During Treatment: Gait belt Activity Tolerance: Patient tolerated  treatment well Patient left: in chair;with call bell/phone within reach;with chair alarm set Nurse Communication: Mobility status PT Visit Diagnosis: Unsteadiness on feet (R26.81);Other abnormalities of gait and mobility (R26.89);Muscle weakness (generalized) (M62.81);Difficulty in walking, not elsewhere classified (R26.2)     Time: 0681-6619 PT Time Calculation (min) (ACUTE ONLY): 30 min  Charges:  $Gait Training: 8-22 mins $Therapeutic Exercise: 8-22 mins                     1:18 PM, 05/18/20 Sarah Velasquez, PT, DPT Physical Therapist - [facility]  [phone] ([place])    Jonet Mathies C 05/18/2020, 1:14 PM

## 2020-05-18 NOTE — Progress Notes (Signed)
PROGRESS NOTE    Sarah Velasquez  BTD:[id number] DOB: March 22, 1946 DOA: 05/12/2020 PCP: Dion Body, MD   Chief complaint.  Altered mental status and hip pain. Brief Narrative:  Sarah Velasquez a 74 y.o.femalewith medical history significant ofhypertension, diabetes mellitus, GERD, depression, anxiety, vertigo, chronic bronchitis, who presents with fall and pain in left hip and shoulder. Patient had accidental fall at home, he sustained a left hip fracture. Scheduled for surgery by orthopedics.  9/9.Hip surgery with intramedullary nail performed.  9/10.Patient has transient hypotension, give fluid bolus. Very confused this morning, no agitation.  9/11. Patient had a near syncope episode today, will continue for another day offluids.  9/13.  Patient had a worsening confusion and agitation yesterday.  Was given Haldol, started Seroquel.  Required a sitter.  Condition better today.  9/14.  Patient had an episode of confusion and agitation at the middle night, will restart Haldol as needed.  Consult neurology.   Assessment & Plan:   Principal Problem:   Closed intertrochanteric fracture of hip, left, initial encounter [facility]) Active Problems:   Mixed hyperlipidemia   GERD   Chronic bronchitis ([place])   Fall at home, initial encounter   Closed fracture dislocation of left shoulder   Diabetes mellitus without complication ([place])   Hypotension   Delirium with dementia   Acute blood loss anemia  #1.  Delirium with Alzheimer dementia. Patient has mild urinary retention, UA negative for UTI. This is secondary to acute illness, pain and the hospital environment. She is on home dose Seroquel at nighttime. Due to continued diarrhea, restarted Haldol as needed. Patient has a neurologist as outpatient, will consult neurology.  2.  Left hip fracture. Postop day #5. She still has some pain with movement.  Per family request, Tylenol scheduled every 8 hours.  She is giving 2.5mg   Percocet as needed to minimize the effect of sedation.  3.  Urinary retention. Seems to be better now.  Patient was able to urinate.  4.  Hypotension secondary to dehydration. Resolved  5.  Acute blood loss anemia secondary to surgery. Stable.  6.  Type 2 diabetes. Stable.  7.  Chronic bronchitis. Stable.      DVT prophylaxis: Lovenox Code Status: Partial Family Communication: Updated son.   Status is: Inpatient  Remains inpatient appropriate because:Inpatient level of care appropriate due to severity of illness   Dispo: The patient is from: Home              Anticipated d/c is to: SNF              Anticipated d/c date is: 2 days              Patient currently is not medically stable to d/c.        I/O last 3 completed shifts: In: 480 [P.O.:480] Out: -  No intake/output data recorded.     Consultants:   Orthopedics.  Procedures: Hip surgery.  Antimicrobials: None  Subjective: Patient still has some pain controlled with the lower dose Percocet.  She had a significant agitation and confusion last night in the middle night.  Security was called. She still has some confusion today, but no agitation.  She knew she is in the hospital. Denies any short of breath or cough. No fever chills. She was able to urinate.  Bladder scan was improving.   Objective: Vitals:   05/17/20 1626 05/17/20 1833 05/17/20 2317 05/18/20 0809  BP: (!) 112/54 95/71 (!) 113/44 (!) 101/58  Pulse: 79 87 74 78  Resp: 16 16 18 18   Temp: 98.6 F (37 C) 98.6 F (37 C) 97.7 F (36.5 C) 98.4 F (36.9 C)  TempSrc: Oral Oral Oral Oral  SpO2: 99% 97% 94% 98%  Weight:      Height:        Intake/Output Summary (Last 24 hours) at 05/18/2020 0930 Last data filed at 05/17/2020 1850 Gross per 24 hour  Intake 480 ml  Output --  Net 480 ml   Filed Weights   05/12/20 1201  Weight: 67.1 kg    Examination:  General exam: Appears calm and comfortable  Respiratory system: Clear  to auscultation. Respiratory effort normal. Cardiovascular system: S1 & S2 heard, RRR. No JVD, murmurs, rubs, gallops or clicks. No pedal edema. Gastrointestinal system: Abdomen is nondistended, soft and nontender. No organomegaly or masses felt. Normal bowel sounds heard. Central nervous system: Alert and oriented x2. No focal neurological deficits. Extremities: Symmetric  Skin: No rashes, lesions or ulcers Psychiatry:  Mood & affect appropriate.     Data Reviewed: I have personally reviewed following labs and imaging studies  CBC: Recent Labs  Lab 05/12/20 1908 05/13/20 0554 05/14/20 0515 05/15/20 0504 05/16/20 0601  WBC 17.4* 12.0* 11.2* 8.3 8.8  NEUTROABS 15.6*  --   --  5.5 6.6  HGB 12.2 11.7* 9.1* 8.3* 8.1*  HCT 36.2 33.2* 26.4* 24.1* 23.3*  MCV 88.1 85.1 88.9 88.0 88.3  PLT 234 216 162 160 937   Basic Metabolic Panel: Recent Labs  Lab 05/12/20 1908 05/13/20 0554 05/14/20 0515 05/15/20 0504  NA 142 139 139 139  K 4.1 4.1 3.2* 3.9  CL 107 103 107 108  CO2 22 23 19* 22  GLUCOSE 170* 118* 128* 113*  BUN 21 20 13 10   CREATININE 0.90 0.92 0.77 0.64  CALCIUM 9.6 9.3 8.1* 8.3*  MG  --   --   --  1.8   GFR: Estimated Creatinine Clearance: 55.5 mL/min (by C-G formula based on SCr of 0.64 mg/dL). Liver Function Tests: No results for input(s): AST, ALT, ALKPHOS, BILITOT, PROT, ALBUMIN in the last 168 hours. No results for input(s): LIPASE, AMYLASE in the last 168 hours. No results for input(s): AMMONIA in the last 168 hours. Coagulation Profile: Recent Labs  Lab 05/12/20 1908  INR 1.1   Cardiac Enzymes: No results for input(s): CKTOTAL, CKMB, CKMBINDEX, TROPONINI in the last 168 hours. BNP (last 3 results) No results for input(s): PROBNP in the last 8760 hours. HbA1C: No results for input(s): HGBA1C in the last 72 hours. CBG: Recent Labs  Lab 05/17/20 0825 05/17/20 1206 05/17/20 1644 05/17/20 2133 05/18/20 0810  GLUCAP 116* 111* 82 154* 125*   Lipid  Profile: No results for input(s): CHOL, HDL, LDLCALC, TRIG, CHOLHDL, LDLDIRECT in the last 72 hours. Thyroid Function Tests: No results for input(s): TSH, T4TOTAL, FREET4, T3FREE, THYROIDAB in the last 72 hours. Anemia Panel: No results for input(s): VITAMINB12, FOLATE, FERRITIN, TIBC, IRON, RETICCTPCT in the last 72 hours. Sepsis Labs: No results for input(s): PROCALCITON, LATICACIDVEN in the last 168 hours.  Recent Results (from the past 240 hour(s))  SARS Coronavirus 2 by RT PCR (hospital order, performed in [facility] hospital lab) Nasopharyngeal Nasopharyngeal Swab     Status: None   Collection Time: 05/12/20  3:58 PM   Specimen: Nasopharyngeal Swab  Result Value Ref Range Status   SARS Coronavirus 2 NEGATIVE NEGATIVE Final    Comment: (NOTE) SARS-CoV-2 target nucleic acids are NOT  DETECTED.  The SARS-CoV-2 RNA is generally detectable in upper and lower respiratory specimens during the acute phase of infection. The lowest concentration of SARS-CoV-2 viral copies this assay can detect is 250 copies / mL. A negative result does not preclude SARS-CoV-2 infection and should not be used as the sole basis for treatment or other patient management decisions.  A negative result may occur with improper specimen collection / handling, submission of specimen other than nasopharyngeal swab, presence of viral mutation(s) within the areas targeted by this assay, and inadequate number of viral copies (<250 copies / mL). A negative result must be combined with clinical observations, patient history, and epidemiological information.  Fact Sheet for Patients:   StrictlyIdeas.no  Fact Sheet for Healthcare Providers: BankingDealers.co.za  This test is not yet approved or  cleared by the [place] FDA and has been authorized for detection and/or diagnosis of SARS-CoV-2 by FDA under an Emergency Use Authorization (EUA).  This EUA will remain in  effect (meaning this test can be used) for the duration of the COVID-19 declaration under Section 564(b)(1) of the Act, 21 U.S.C. section 360bbb-3(b)(1), unless the authorization is terminated or revoked sooner.  Performed at [facility], [address] [place], [place] [address]   MRSA PCR Screening     Status: None   Collection Time: 05/13/20  7:34 AM   Specimen: Nasopharyngeal  Result Value Ref Range Status   MRSA by PCR NEGATIVE NEGATIVE Final    Comment:        The GeneXpert MRSA Assay (FDA approved for NASAL specimens only), is one component of a comprehensive MRSA colonization surveillance program. It is not intended to diagnose MRSA infection nor to guide or monitor treatment for MRSA infections. Performed at [facility], [address], [place], [place] [address]          Radiology Studies: No results found.      Scheduled Meds: . acetaminophen  650 mg Oral Q8H  . aspirin EC  81 mg Oral Daily  . atorvastatin  80 mg Oral Daily  . cholecalciferol  1,000 Units Oral Daily  . docusate sodium  100 mg Oral BID  . donepezil  10 mg Oral QHS  . enoxaparin (LOVENOX) injection  40 mg Subcutaneous Q24H  . escitalopram  5 mg Oral Daily  . ezetimibe  10 mg Oral Daily  . feeding supplement (GLUCERNA SHAKE)  237 mL Oral TID BM  . ferrous [id number] C-folic acid  1 capsule Oral BID PC  . insulin aspart  0-5 Units Subcutaneous QHS  . insulin aspart  0-9 Units Subcutaneous TID WC  . lactulose  20 g Oral Once  . loratadine  10 mg Oral Daily  . melatonin  2.5 mg Oral QHS  . multivitamin with minerals  1 tablet Oral Daily  . QUEtiapine  25 mg Oral QHS   Continuous Infusions:   LOS: 6 days    Time spent: 40 minutes    Sharen Hones, MD [facility]   To contact the attending provider between 7A-7P or the covering provider during after hours 7P-7A, please log into the web site [email or web address] and access using universal [facility] password for that web site. If you do not have the password, please call the hospital operator.  05/18/2020, 9:30 AM

## 2020-05-18 NOTE — Progress Notes (Signed)
Patient became very agitated and began yelling at me stating that she was going to get up by herself because she didn't know why I was in her house. She told me not to touch her and that she was about to called the police. I called NT for assistance getting her back in bed and she began yelling very loudly for someone to help her she stated that we were man handling her. Patient then began swinging at staff and I secure chatted NP Randol Kern whom ordered haldol IM. While waiting on the order to be verified by pharmacy I called patient's son to see if that would calm her down but she began cursing at him and yelling then threw phone across the room. Security came up to try to calm patient down. I have the IM dose of haldol and patient is now sleeping in her room.

## 2020-05-19 DIAGNOSIS — K219 Gastro-esophageal reflux disease without esophagitis: Secondary | ICD-10-CM | POA: Diagnosis not present

## 2020-05-19 DIAGNOSIS — M62838 Other muscle spasm: Secondary | ICD-10-CM | POA: Diagnosis not present

## 2020-05-19 DIAGNOSIS — R5381 Other malaise: Secondary | ICD-10-CM | POA: Diagnosis not present

## 2020-05-19 DIAGNOSIS — M519 Unspecified thoracic, thoracolumbar and lumbosacral intervertebral disc disorder: Secondary | ICD-10-CM | POA: Diagnosis not present

## 2020-05-19 DIAGNOSIS — F419 Anxiety disorder, unspecified: Secondary | ICD-10-CM | POA: Diagnosis not present

## 2020-05-19 DIAGNOSIS — Z7984 Long term (current) use of oral hypoglycemic drugs: Secondary | ICD-10-CM | POA: Diagnosis not present

## 2020-05-19 DIAGNOSIS — W19XXXD Unspecified fall, subsequent encounter: Secondary | ICD-10-CM | POA: Diagnosis not present

## 2020-05-19 DIAGNOSIS — S72142A Displaced intertrochanteric fracture of left femur, initial encounter for closed fracture: Secondary | ICD-10-CM | POA: Diagnosis not present

## 2020-05-19 DIAGNOSIS — K449 Diaphragmatic hernia without obstruction or gangrene: Secondary | ICD-10-CM | POA: Diagnosis not present

## 2020-05-19 DIAGNOSIS — M25552 Pain in left hip: Secondary | ICD-10-CM | POA: Diagnosis not present

## 2020-05-19 DIAGNOSIS — S72142D Displaced intertrochanteric fracture of left femur, subsequent encounter for closed fracture with routine healing: Secondary | ICD-10-CM | POA: Diagnosis not present

## 2020-05-19 DIAGNOSIS — I10 Essential (primary) hypertension: Secondary | ICD-10-CM | POA: Diagnosis not present

## 2020-05-19 DIAGNOSIS — E782 Mixed hyperlipidemia: Secondary | ICD-10-CM | POA: Diagnosis not present

## 2020-05-19 DIAGNOSIS — R42 Dizziness and giddiness: Secondary | ICD-10-CM | POA: Diagnosis not present

## 2020-05-19 DIAGNOSIS — J42 Unspecified chronic bronchitis: Secondary | ICD-10-CM | POA: Diagnosis not present

## 2020-05-19 DIAGNOSIS — S4292XD Fracture of left shoulder girdle, part unspecified, subsequent encounter for fracture with routine healing: Secondary | ICD-10-CM | POA: Diagnosis not present

## 2020-05-19 DIAGNOSIS — M47819 Spondylosis without myelopathy or radiculopathy, site unspecified: Secondary | ICD-10-CM | POA: Diagnosis not present

## 2020-05-19 DIAGNOSIS — S42292A Other displaced fracture of upper end of left humerus, initial encounter for closed fracture: Secondary | ICD-10-CM | POA: Diagnosis not present

## 2020-05-19 DIAGNOSIS — S72009A Fracture of unspecified part of neck of unspecified femur, initial encounter for closed fracture: Secondary | ICD-10-CM | POA: Diagnosis not present

## 2020-05-19 DIAGNOSIS — F0280 Dementia in other diseases classified elsewhere without behavioral disturbance: Secondary | ICD-10-CM | POA: Diagnosis not present

## 2020-05-19 DIAGNOSIS — G309 Alzheimer's disease, unspecified: Secondary | ICD-10-CM | POA: Diagnosis not present

## 2020-05-19 DIAGNOSIS — M6281 Muscle weakness (generalized): Secondary | ICD-10-CM | POA: Diagnosis not present

## 2020-05-19 DIAGNOSIS — R4189 Other symptoms and signs involving cognitive functions and awareness: Secondary | ICD-10-CM | POA: Diagnosis not present

## 2020-05-19 DIAGNOSIS — M8580 Other specified disorders of bone density and structure, unspecified site: Secondary | ICD-10-CM | POA: Diagnosis not present

## 2020-05-19 DIAGNOSIS — E1169 Type 2 diabetes mellitus with other specified complication: Secondary | ICD-10-CM | POA: Diagnosis not present

## 2020-05-19 DIAGNOSIS — E785 Hyperlipidemia, unspecified: Secondary | ICD-10-CM | POA: Diagnosis not present

## 2020-05-19 DIAGNOSIS — R279 Unspecified lack of coordination: Secondary | ICD-10-CM | POA: Diagnosis not present

## 2020-05-19 DIAGNOSIS — E119 Type 2 diabetes mellitus without complications: Secondary | ICD-10-CM | POA: Diagnosis not present

## 2020-05-19 DIAGNOSIS — M8000XD Age-related osteoporosis with current pathological fracture, unspecified site, subsequent encounter for fracture with routine healing: Secondary | ICD-10-CM | POA: Diagnosis not present

## 2020-05-19 LAB — CBC WITH DIFFERENTIAL/PLATELET
Abs Immature Granulocytes: 0.05 10*3/uL (ref 0.00–0.07)
Basophils Absolute: 0 10*3/uL (ref 0.0–0.1)
Basophils Relative: 1 %
Eosinophils Absolute: 0.3 10*3/uL (ref 0.0–0.5)
Eosinophils Relative: 4 %
HCT: 23.6 % — ABNORMAL LOW (ref 36.0–46.0)
Hemoglobin: 7.9 g/dL — ABNORMAL LOW (ref 12.0–15.0)
Immature Granulocytes: 1 %
Lymphocytes Relative: 23 %
Lymphs Abs: 1.8 10*3/uL (ref 0.7–4.0)
MCH: 30.2 pg (ref 26.0–34.0)
MCHC: 33.5 g/dL (ref 30.0–36.0)
MCV: 90.1 fL (ref 80.0–100.0)
Monocytes Absolute: 0.7 10*3/uL (ref 0.1–1.0)
Monocytes Relative: 9 %
Neutro Abs: 5 10*3/uL (ref 1.7–7.7)
Neutrophils Relative %: 62 %
Platelets: 268 10*3/uL (ref 150–400)
RBC: 2.62 MIL/uL — ABNORMAL LOW (ref 3.87–5.11)
RDW: 14.5 % (ref 11.5–15.5)
WBC: 7.9 10*3/uL (ref 4.0–10.5)
nRBC: 0 % (ref 0.0–0.2)

## 2020-05-19 LAB — BASIC METABOLIC PANEL
Anion gap: 10 (ref 5–15)
BUN: 14 mg/dL (ref 8–23)
CO2: 24 mmol/L (ref 22–32)
Calcium: 8.3 mg/dL — ABNORMAL LOW (ref 8.9–10.3)
Chloride: 107 mmol/L (ref 98–111)
Creatinine, Ser: 0.67 mg/dL (ref 0.44–1.00)
GFR calc Af Amer: >60 mL/min (ref >60)
GFR calc non Af Amer: >60 mL/min (ref >60)
Glucose, Bld: 137 mg/dL — ABNORMAL HIGH (ref 70–99)
Potassium: 3.6 mmol/L (ref 3.5–5.1)
Sodium: 141 mmol/L (ref 135–145)

## 2020-05-19 LAB — MAGNESIUM: Magnesium: 2.1 mg/dL (ref 1.7–2.4)

## 2020-05-19 LAB — GLUCOSE, CAPILLARY
Glucose-Capillary: 137 mg/dL — ABNORMAL HIGH (ref 70–99)
Glucose-Capillary: 108 mg/dL — ABNORMAL HIGH (ref 70–99)

## 2020-05-19 LAB — SARS CORONAVIRUS 2 BY RT PCR (HOSPITAL ORDER, PERFORMED IN ~~LOC~~ HOSPITAL LAB): SARS Coronavirus 2: NEGATIVE

## 2020-05-19 MED ORDER — ACETAMINOPHEN 325 MG PO TABS: 650.0000 mg | ORAL_TABLET | Freq: Three times a day (TID) | ORAL | Status: DC

## 2020-05-19 MED ORDER — FE FUMARATE-B12-VIT C-FA-IFC PO CAPS: 1.0000 | ORAL_CAPSULE | Freq: Two times a day (BID) | ORAL | Status: DC

## 2020-05-19 MED ORDER — METHOCARBAMOL 500 MG PO TABS: 500.0000 mg | ORAL_TABLET | Freq: Three times a day (TID) | ORAL | Status: DC | PRN

## 2020-05-19 MED ORDER — MAGNESIUM HYDROXIDE 400 MG/5ML PO SUSP: 30.0000 mL | Freq: Every day | ORAL | 0 refills | Status: DC | PRN

## 2020-05-19 MED ORDER — ADULT MULTIVITAMIN W/MINERALS CH: 1.0000 | ORAL_TABLET | Freq: Every day | ORAL | Status: DC

## 2020-05-19 MED ORDER — BISACODYL 10 MG RE SUPP: 10.0000 mg | Freq: Every day | RECTAL | 0 refills | Status: DC | PRN

## 2020-05-19 MED ORDER — VITAMIN D3 25 MCG PO TABS: 1000.0000 [IU] | ORAL_TABLET | Freq: Every day | ORAL | Status: DC

## 2020-05-19 NOTE — Progress Notes (Signed)
Subjective: 6 Days Post-Op Procedure(s) (LRB): INTRAMEDULLARY (IM) NAIL INTERTROCHANTRIC (Left) Patient reports pain as mild. Doing well this am. Alert. Ambulating to chair. No complaints. We will continue therapy today. Ambulated 19 ft yesterday Plan is to go Skilled nursing facility after hospital stay.   Objective: Vital signs in last 24 hours: Temp:  [98.4 F (36.9 C)-98.7 F (37.1 C)] 98.5 F (36.9 C) (09/15 0753) Pulse Rate:  [80-88] 84 (09/15 0753) Resp:  [14-18] 18 (09/15 0753) BP: (99-106)/(55-63) 103/55 (09/15 0753) SpO2:  [92 %-97 %] 94 % (09/15 0753)  Intake/Output from previous day: 09/14 0701 - 09/15 0700 In: 120 [P.O.:120] Out: -  Intake/Output this shift: No intake/output data recorded.  Recent Labs    05/19/20 0423  HGB 7.9*   Recent Labs    05/19/20 0423  WBC 7.9  RBC 2.62*  HCT 23.6*  PLT 268   Recent Labs    05/19/20 0423  NA 141  K 3.6  CL 107  CO2 24  BUN 14  CREATININE 0.67  GLUCOSE 137*  CALCIUM 8.3*   No results for input(s): LABPT, INR in the last 72 hours.  EXAM General - Patient is Alert and Oriented  Left upper extremity -minimal swelling throughout the left upper extremity.  Neurovascular intact.  No deformity Left lower extremity - Neurovascular intact Sensation intact distally Intact pulses distally Dorsiflexion/Plantar flexion intact No cellulitis present Compartment soft Dressing - dressing C/D/I and no drainage Motor Function - intact, moving foot and toes well on exam.   Past Medical History:  Diagnosis Date  . Anxiety   . Chest pain 02/15-16/2009   Hospital [facility]  chest pain rule out //stress Myoview negative 10/21/2007//Stress cardiolite ;apical thinning ,negative ischemia (09/14/2003)  . Diabetes mellitus    typeII  . Diverticulosis of colon 09/2003   colonoscopy ext.and internal hemorrhoids (Dr. Vira Agar )12/03/2006// colonoscopy polyps multiple  benign; interal hemorrhoids ,divertics 09/16/2003  .  Drug-induced constipation   . Environmental allergies   . GERD (gastroesophageal reflux disease)    EGD hiatal hernia; esophagitis/sigmoidoscopy WNL (Dr. Vira Agar) 03/04/1996: ABD. U/S  WNL (06/1996)//EGD multiple polyps ;small H.H (09/16/2003)  . H/O hiatal hernia   . Hyperlipidemia 05/1995  . Osteopenia 12/2011   spine -1.8, hip -0.7  . PONV (postoperative nausea and vomiting)   . Right lumbar radiculitis 2013   DDD, spondylosis, L3 radiculitis (MRI 07/2012) - ESI 09/2011 (Dr Sharlet Salina)   . Vertigo    hx of    Assessment/Plan:   6 Days Post-Op Procedure(s) (LRB): INTRAMEDULLARY (IM) NAIL INTERTROCHANTRIC (Left) Principal Problem:   Closed intertrochanteric fracture of hip, left, initial encounter [facility]) Active Problems:   Mixed hyperlipidemia   GERD   Chronic bronchitis ([place])   Fall at home, initial encounter   Closed fracture dislocation of left shoulder   Diabetes mellitus without complication ([facility])   Hypotension   Delirium with dementia   Acute blood loss anemia  Estimated body mass index is 24.63 kg/m as calculated from the following:   Height as of this encounter: 5\' 5"  (1.651 m).   Weight as of this encounter: 67.1 kg. Advance diet Up with therapy, weightbearing as tolerated left lower extremity. Labs and vital signs are stable Hemoglobin 7.9, stable    Follow-up with [place] [facility] in 2 weeks for x-rays of the left shoulder and staple removal of the left hip TED hose bilateral lower extremity x6 weeks Lovenox 40 mg subcu daily x14 days    DVT Prophylaxis - Lovenox,  TED hose and SCDs Weight-Bearing as tolerated to left leg   T. Rachelle Hora, PA-C [place] 05/19/2020, 8:19 AM

## 2020-05-19 NOTE — Progress Notes (Signed)
[facility] is here to pick up pt and take her to [facility].  Two belongings bags are going with her along with her cell phone and charger.  IV was removed from pts L forearm without issue. Son at bedside until pt left.

## 2020-05-19 NOTE — Discharge Summary (Signed)
Sarah Velasquez:[id number] DOB: April 08, 1946 DOA: 05/12/2020  PCP: Dion Body, MD  Admit date: 05/12/2020 Discharge date: 05/19/2020  Admitted From: Home Disposition: SNF  Recommendations for Outpatient Follow-up:  1. Follow up with PCP in 1 week 2. Please obtain BMP/CBC in one week 3.Follow-up with [facility] orthopedics in 2 weeks for x-rays of the left shoulder and staple removal of the left hip 4.TED hose bilateral lower extremity x6 weeks 5.Lovenox 40 mg subcu daily x14 days   Discharge Condition:Stable CODE STATUS:partial , no intubation Diet recommendation: Carb Modified   Brief/Interim Summary: Sarah Velasquez is a 74 y.o. female with medical history significant of hypertension, diabetes mellitus, GERD, depression, anxiety, vertigo, chronic bronchitis, who presents with fall and pain in left hip and shoulder.CT head is negative for acute intracranial abnormalities.  X-ray showed left intertrochanteric hip fracture and left shoulder fracture dislocation.  This was consulted.  She underwent intramedullary nail surgery.  She did have delirium during her hospitalization requiring Haldol as needed.  Neurology was consulted they suspected that her delirium is likely happened in the setting of hospitalization.  #1.  Delirium with Alzheimer dementia. Patient has mild urinary retention, UA negative for UTI. This is secondary to acute illness, pain and the hospital environment. Neurology consulted. Recommends continuing Seroquel at bedtime Delirium precautions Obtaining EKG before considering adding or treating antipsychotics if needed 9/15: overnight did well. No issues reported   2.  Left hip fracture. Postop day #6.  Per family request, Tylenol scheduled every 8 hours.  She is given 2.5mg  Percocet as needed to minimize the effect of sedation.  3.  Urinary retention. Improved. Able to urinate.  4.  Hypotension secondary to dehydration. Resolved  5.  Acute blood loss anemia secondary to  surgery. Stable. H/h stable, no transfusion necessary Hemoglobin 7.9 Check CBC in 3 days  6.  Type 2 diabetes. BG stable. Continue outpt meds on discharge  7.  Chronic bronchitis. Stable.     Discharge Diagnoses:  Principal Problem:   Closed intertrochanteric fracture of hip, left, initial encounter [facility]) Active Problems:   Mixed hyperlipidemia   GERD   Chronic bronchitis ([place])   Fall at home, initial encounter   Closed fracture dislocation of left shoulder   Diabetes mellitus without complication (HCC)   Hypotension   Delirium with dementia   Acute blood loss anemia    Discharge Instructions  Discharge Instructions     Remove dressing in 72 hours   Complete by: As directed    Call MD for:  temperature >100.4   Complete by: As directed    Diet - low sodium heart healthy   Complete by: As directed    Increase activity slowly   Complete by: As directed      Allergies as of 05/19/2020      Reactions   Fish Allergy Anaphylaxis   Shellfish Allergy Anaphylaxis   Azithromycin    REACTION: rash   Meperidine Hcl    REACTION: nausea   Neurontin [gabapentin] Other (See Comments)   Incoherent   Omeprazole    REACTION: rash ([facility] brand)      Medication List    STOP taking these medications   lisinopril 2.5 MG tablet Commonly known as: ZESTRIL   Vitamin D3 25 MCG (1000 UT) Caps Replaced by: Vitamin D3 25 MCG tablet     TAKE these medications   acetaminophen 325 MG tablet Commonly known as: TYLENOL Take 2 tablets (650 mg total) by mouth every 8 (eight)  hours.   aspirin 81 MG tablet Take 81 mg by mouth daily.   atorvastatin 80 MG tablet Commonly known as: LIPITOR Take 1 tablet (80 mg total) by mouth daily.   bisacodyl 10 MG suppository Commonly known as: DULCOLAX Place 1 suppository (10 mg total) rectally daily as needed for moderate constipation.   busPIRone 10 MG tablet Commonly known as: BUSPAR Take 10 mg by mouth 2 (two) times daily.    docusate sodium 100 MG capsule Commonly known as: COLACE Take 200 mg by mouth daily.   donepezil 10 MG tablet Commonly known as: ARICEPT Take 10 mg by mouth at bedtime.   enoxaparin 40 MG/0.4ML injection Commonly known as: LOVENOX Inject 0.4 mLs (40 mg total) into the skin daily for 14 days.   escitalopram 10 MG tablet Commonly known as: LEXAPRO Take 10 mg by mouth daily.   ferrous [id number] C-folic acid capsule Commonly known as: TRINSICON / FOLTRIN Take 1 capsule by mouth 2 (two) times daily after a meal.   fluticasone 50 MCG/ACT nasal spray Commonly known as: FLONASE Place 2 sprays into the nose daily.   glucose blood test strip Commonly known as: ONE TOUCH ULTRA TEST 1 each by Other route daily. Use to check sugar daily Dx: E11.9 **ONE TOUCH ULTRA BLUE**   magnesium hydroxide 400 MG/5ML suspension Commonly known as: MILK OF MAGNESIA Take 30 mLs by mouth daily as needed for mild constipation.   metFORMIN 500 MG tablet Commonly known as: GLUCOPHAGE Take 500 mg by mouth 2 (two) times daily.   methocarbamol 500 MG tablet Commonly known as: ROBAXIN Take 1 tablet (500 mg total) by mouth every 8 (eight) hours as needed for muscle spasms.   multivitamin with minerals Tabs tablet Take 1 tablet by mouth daily. Start taking on: May 20, 7424   OneTouch Delica Lancets 95G Misc USE ONE LANCET ONCE DAILY AS DIRECTED   oxyCODONE-acetaminophen 5-325 MG tablet Commonly known as: PERCOCET/ROXICET Take 1 tablet by mouth every 6 (six) hours as needed for moderate pain.   QUEtiapine 25 MG tablet Commonly known as: SEROQUEL Take 25 mg by mouth at bedtime.   Vitamin D3 25 MCG tablet Commonly known as: Vitamin D Take 1 tablet (1,000 Units total) by mouth daily. Start taking on: May 20, 2020 Replaces: Vitamin D3 25 MCG (1000 UT) Caps   ZyrTEC Allergy 10 MG tablet Generic drug: cetirizine Take 10 mg by mouth daily.       Contact information for  follow-up providers    Duanne Guess, PA-C On 06/02/2020.   Specialties: Orthopedic Surgery, Emergency Medicine Why: AT [address] Contact information: [place] [address] [phone]            Contact information for after-discharge care    [facility] .   Service: Skilled Nursing Contact information: [place] [phone]                 Allergies  Allergen Reactions  . Fish Allergy Anaphylaxis  . Shellfish Allergy Anaphylaxis  . Azithromycin     REACTION: rash  . Meperidine Hcl     REACTION: nausea  . Neurontin [Gabapentin] Other (See Comments)    Incoherent  . Omeprazole     REACTION: rash ([facility] brand)    Consultations:  Orthopedics and neurology   Procedures/Studies: DG Chest 1 View  Result Date: 05/12/2020 CLINICAL DATA:  Syncopal episode. [place] and injured left  shoulder and left hip. EXAM: CHEST  1 VIEW COMPARISON:  Chest x-ray 07/12/2015 FINDINGS: The cardiac silhouette, mediastinal and hilar contours are within normal limits and stable. The lungs are clear of an acute process. No pleural effusion or pneumothorax. Fracture dislocation noted involving the left shoulder. The right shoulder is intact. No definite rib fractures. IMPRESSION: 1. No acute cardiopulmonary findings. 2. Left shoulder fracture dislocation. Electronically Signed   By: Marijo Sanes M.D.   On: 05/12/2020 12:57   CT HEAD WO CONTRAST  Result Date: 05/15/2020 CLINICAL DATA:  Head trauma, minor. Possible head trauma. Unwitnessed fall. EXAM: CT HEAD WITHOUT CONTRAST TECHNIQUE: Contiguous axial images were obtained from the base of the skull through the vertex without intravenous contrast. COMPARISON:  Head CT 05/12/2020.  Brain MRI 12/10/2019. FINDINGS: Brain: Stable, mild to moderate generalized parenchymal atrophy. There is no acute intracranial hemorrhage. No demarcated cortical  infarct. No extra-axial fluid collection. No evidence of intracranial mass. No midline shift. Vascular: No hyperdense vessel. Skull: Normal. Negative for fracture or focal lesion. Sinuses/Orbits: Visualized orbits show no acute finding. Mild ethmoid sinus mucosal thickening. No significant mastoid effusion. IMPRESSION: No evidence of acute intracranial abnormality. Stable mild-to-moderate generalized parenchymal atrophy. Mild ethmoid sinus mucosal thickening. Electronically Signed   By: Kellie Simmering DO   On: 05/15/2020 23:22   CT HEAD WO CONTRAST  Result Date: 05/12/2020 CLINICAL DATA:  Syncope with fall EXAM: CT HEAD WITHOUT CONTRAST TECHNIQUE: Contiguous axial images were obtained from the base of the skull through the vertex without intravenous contrast. COMPARISON:  None. FINDINGS: Brain: There is age related volume loss. There is no intracranial mass, hemorrhage, extra-axial fluid collection, or midline shift. There is slight small vessel disease in the centra semiovale bilaterally. No acute infarct evident. Vascular: No hyperdense vessel. There is mild calcification in the carotid siphon regions bilaterally. Skull: Bony calvarium appears intact. Sinuses/Orbits: There is mucosal thickening in several ethmoid air cells. Other paranasal sinuses are clear. Orbits appear symmetric bilaterally. Other: Mastoid air cells are clear. IMPRESSION: Age related volume loss with slight periventricular small vessel disease. No acute infarct. No mass or hemorrhage. Foci of arterial vascular calcification noted. Mucosal thickening noted in several ethmoid air cells. Electronically Signed   By: Lowella Grip III M.D.   On: 05/12/2020 14:02   DG Shoulder Left  Result Date: 05/12/2020 CLINICAL DATA:  Fall. EXAM: LEFT SHOULDER - 2+ VIEW COMPARISON:  None. FINDINGS: Anterior dislocation of the humeral head with displaced posterior greater tuberosity fracture fragment. Mild degenerative changes of the acromioclavicular  joint. Soft tissues are unremarkable. IMPRESSION: 1. Anterior shoulder dislocation with displaced posterior greater tuberosity fracture fragment. Electronically Signed   By: Titus Dubin M.D.   On: 05/12/2020 12:57   DG HIP OPERATIVE UNILAT W OR W/O PELVIS LEFT  Result Date: 05/13/2020 CLINICAL DATA:  LEFT hip surgery EXAM: OPERATIVE LEFT HIP (WITH PELVIS IF PERFORMED) 4 VIEWS TECHNIQUE: Fluoroscopic spot image(s) were submitted for interpretation post-operatively. COMPARISON:  05/12/2020 FLUOROSCOPY TIME:  0 minutes 49.5 seconds Images: 4 Dose: 4.8664 mGy FINDINGS: Images demonstrate placement of an IM nail with proximal compression screw in LEFT femur post ORIF of an intertrochanteric fracture. Displaced lesser trochanteric fracture fragment again seen. Distal locking screw placed. Bones demineralized. IMPRESSION: Post ORIF of previously identified intertrochanteric fracture LEFT femur. Electronically Signed   By: Lavonia Dana M.D.   On: 05/13/2020 16:57   DG Hip Unilat W or Wo Pelvis 2-3 Views Left  Result Date: 05/12/2020 CLINICAL DATA:  Left hip in a fall secondary pain due to an injury suffered to syncope today. Initial encounter. EXAM: DG HIP (WITH OR WITHOUT PELVIS) 2-3V LEFT COMPARISON:  None. FINDINGS: The patient has an acute left intertrochanteric fracture. The lesser trochanter is a separate fragment. No other acute abnormality is identified. Lower lumbar spondylosis noted. IMPRESSION: Acute left intertrochanteric fracture. Electronically Signed   By: Inge Rise M.D.   On: 05/12/2020 12:57      Subjective: Has no complaints, denies shortness of breath or dizziness or pain  Discharge Exam: Vitals:   05/18/20 2319 05/19/20 0753  BP: 106/63 (!) 103/55  Pulse: 88 84  Resp: 14 18  Temp: 98.7 F (37.1 C) 98.5 F (36.9 C)  SpO2: 92% 94%   Vitals:   05/18/20 0809 05/18/20 1616 05/18/20 2319 05/19/20 0753  BP: (!) 101/58 (!) 99/56 106/63 (!) 103/55  Pulse: 78 80 88 84  Resp:  18 15 14 18   Temp: 98.4 F (36.9 C) 98.4 F (36.9 C) 98.7 F (37.1 C) 98.5 F (36.9 C)  TempSrc: Oral Oral Oral Oral  SpO2: 98% 97% 92% 94%  Weight:      Height:        General: Pt is alert, awake x3, not in acute distress, sitting in chair, pleasant Cardiovascular: RRR, S1/S2 +, no rubs, no gallops Respiratory: CTA bilaterally, no wheezing, no rhonchi Abdominal: Soft, NT, ND, bowel sounds + Extremities: no edema, no cyanosis    The results of significant diagnostics from this hospitalization (including imaging, microbiology, ancillary and laboratory) are listed below for reference.     Microbiology: Recent Results (from the past 240 hour(s))  SARS Coronavirus 2 by RT PCR (hospital order, performed in [facility] hospital lab) Nasopharyngeal Nasopharyngeal Swab     Status: None   Collection Time: 05/12/20  3:58 PM   Specimen: Nasopharyngeal Swab  Result Value Ref Range Status   SARS Coronavirus 2 NEGATIVE NEGATIVE Final    Comment: (NOTE) SARS-CoV-2 target nucleic acids are NOT DETECTED.  The SARS-CoV-2 RNA is generally detectable in upper and lower respiratory specimens during the acute phase of infection. The lowest concentration of SARS-CoV-2 viral copies this assay can detect is 250 copies / mL. A negative result does not preclude SARS-CoV-2 infection and should not be used as the sole basis for treatment or other patient management decisions.  A negative result may occur with improper specimen collection / handling, submission of specimen other than nasopharyngeal swab, presence of viral mutation(s) within the areas targeted by this assay, and inadequate number of viral copies (<250 copies / mL). A negative result must be combined with clinical observations, patient history, and epidemiological information.  Fact Sheet for Patients:   StrictlyIdeas.no  Fact Sheet for Healthcare Providers: BankingDealers.co.za  This  test is not yet approved or  cleared by the [place] FDA and has been authorized for detection and/or diagnosis of SARS-CoV-2 by FDA under an Emergency Use Authorization (EUA).  This EUA will remain in effect (meaning this test can be used) for the duration of the COVID-19 declaration under Section 564(b)(1) of the Act, 21 U.S.C. section 360bbb-3(b)(1), unless the authorization is terminated or revoked sooner.  Performed at [facility] [place] [facility], [address] [place], [place] [address]   MRSA PCR Screening     Status: None   Collection Time: 05/13/20  7:34 AM   Specimen: Nasopharyngeal  Result Value Ref Range Status   MRSA by PCR NEGATIVE NEGATIVE Final    Comment:  The GeneXpert MRSA Assay (FDA approved for NASAL specimens only), is one component of a comprehensive MRSA colonization surveillance program. It is not intended to diagnose MRSA infection nor to guide or monitor treatment for MRSA infections. Performed at [facility], [address] [place], [place] [address]      Labs: BNP (last 3 results) No results for input(s): BNP in the last 8760 hours. Basic Metabolic Panel: Recent Labs  Lab 05/12/20 1908 05/13/20 0554 05/14/20 0515 05/15/20 0504 05/19/20 0423  NA 142 139 139 139 141  K 4.1 4.1 3.2* 3.9 3.6  CL 107 103 107 108 107  CO2 22 23 19* 22 24  GLUCOSE 170* 118* 128* 113* 137*  BUN 21 20 13 10 14   CREATININE 0.90 0.92 0.77 0.64 0.67  CALCIUM 9.6 9.3 8.1* 8.3* 8.3*  MG  --   --   --  1.8 2.1   Liver Function Tests: No results for input(s): AST, ALT, ALKPHOS, BILITOT, PROT, ALBUMIN in the last 168 hours. No results for input(s): LIPASE, AMYLASE in the last 168 hours. No results for input(s): AMMONIA in the last 168 hours. CBC: Recent Labs  Lab 05/12/20 1908 05/12/20 1908 05/13/20 0554 05/14/20 0515 05/15/20 0504 05/16/20 0601 05/19/20 0423  WBC 17.4*   < > 12.0* 11.2* 8.3 8.8 7.9  NEUTROABS 15.6*  --   --   --  5.5  6.6 5.0  HGB 12.2   < > 11.7* 9.1* 8.3* 8.1* 7.9*  HCT 36.2   < > 33.2* 26.4* 24.1* 23.3* 23.6*  MCV 88.1   < > 85.1 88.9 88.0 88.3 90.1  PLT 234   < > 216 162 160 172 268   < > = values in this interval not displayed.   Cardiac Enzymes: No results for input(s): CKTOTAL, CKMB, CKMBINDEX, TROPONINI in the last 168 hours. BNP: Invalid input(s): POCBNP CBG: Recent Labs  Lab 05/18/20 0810 05/18/20 1128 05/18/20 1617 05/18/20 2107 05/19/20 0755  GLUCAP 125* 198* 112* 135* 137*   D-Dimer No results for input(s): DDIMER in the last 72 hours. Hgb A1c No results for input(s): HGBA1C in the last 72 hours. Lipid Profile No results for input(s): CHOL, HDL, LDLCALC, TRIG, CHOLHDL, LDLDIRECT in the last 72 hours. Thyroid function studies No results for input(s): TSH, T4TOTAL, T3FREE, THYROIDAB in the last 72 hours.  Invalid input(s): FREET3 Anemia work up No results for input(s): VITAMINB12, FOLATE, FERRITIN, TIBC, IRON, RETICCTPCT in the last 72 hours. Urinalysis    Component Value Date/Time   COLORURINE YELLOW (A) 05/17/2020 1057   APPEARANCEUR CLEAR (A) 05/17/2020 1057   LABSPEC 1.016 05/17/2020 1057   PHURINE 7.0 05/17/2020 1057   GLUCOSEU NEGATIVE 05/17/2020 1057   HGBUR NEGATIVE 05/17/2020 1057   BILIRUBINUR NEGATIVE 05/17/2020 1057   BILIRUBINUR neg 03/19/2015 1357   KETONESUR 5 (A) 05/17/2020 1057   PROTEINUR NEGATIVE 05/17/2020 1057   UROBILINOGEN negative 03/19/2015 1357   NITRITE NEGATIVE 05/17/2020 1057   LEUKOCYTESUR NEGATIVE 05/17/2020 1057   Sepsis Labs Invalid input(s): PROCALCITONIN,  WBC,  LACTICIDVEN Microbiology Recent Results (from the past 240 hour(s))  SARS Coronavirus 2 by RT PCR (hospital order, performed in [place] hospital lab) Nasopharyngeal Nasopharyngeal Swab     Status: None   Collection Time: 05/12/20  3:58 PM   Specimen: Nasopharyngeal Swab  Result Value Ref Range Status   SARS Coronavirus 2 NEGATIVE NEGATIVE Final    Comment:  (NOTE) SARS-CoV-2 target nucleic acids are NOT DETECTED.  The SARS-CoV-2 RNA  is generally detectable in upper and lower respiratory specimens during the acute phase of infection. The lowest concentration of SARS-CoV-2 viral copies this assay can detect is 250 copies / mL. A negative result does not preclude SARS-CoV-2 infection and should not be used as the sole basis for treatment or other patient management decisions.  A negative result may occur with improper specimen collection / handling, submission of specimen other than nasopharyngeal swab, presence of viral mutation(s) within the areas targeted by this assay, and inadequate number of viral copies (<250 copies / mL). A negative result must be combined with clinical observations, patient history, and epidemiological information.  Fact Sheet for Patients:   StrictlyIdeas.no  Fact Sheet for Healthcare Providers: BankingDealers.co.za  This test is not yet approved or  cleared by the [place] FDA and has been authorized for detection and/or diagnosis of SARS-CoV-2 by FDA under an Emergency Use Authorization (EUA).  This EUA will remain in effect (meaning this test can be used) for the duration of the COVID-19 declaration under Section 564(b)(1) of the Act, 21 U.S.C. section 360bbb-3(b)(1), unless the authorization is terminated or revoked sooner.  Performed at [facility], [address] [place], [place] [address]   MRSA PCR Screening     Status: None   Collection Time: 05/13/20  7:34 AM   Specimen: Nasopharyngeal  Result Value Ref Range Status   MRSA by PCR NEGATIVE NEGATIVE Final    Comment:        The GeneXpert MRSA Assay (FDA approved for NASAL specimens only), is one component of a comprehensive MRSA colonization surveillance program. It is not intended to diagnose MRSA infection nor to guide or monitor treatment for MRSA infections. Performed at  [facility], [address], [place], [place] [address]      Time coordinating discharge: Over 30 minutes  SIGNED:   Nolberto Hanlon, MD  [facility] 05/19/2020, 11:22 AM Pager   If 7PM-7AM, please contact night-coverage [email or web address] Password [email or web address]

## 2020-05-19 NOTE — Progress Notes (Signed)
Report called and given to [facility] and [facility].  Pt is dressed and ready for transportation.  Son is at bedside.

## 2020-05-19 NOTE — Progress Notes (Signed)
Physical Therapy Treatment Patient Details Name: Sarah Velasquez MRN: [id number] DOB: Apr 25, 1946 Today's Date: 05/19/2020    History of Present Illness Sarah Velasquez is a 59yoF who comes to [facility] 9/8 from home after a fall. Pt c/o Lt hip and shoulder pain. Imaging reveals Left hip fracture, left shoulder fracture/dislocation. Pt underwent Left femur ORIF on 9/9 c Dr. Rudene Christians c IM nailing. LLE now WBAT. 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    PT Comments    Pt in chair at entry, awake, appears more alert and energetic this morning, lots of big smiles. Pt does well with chair based exercises, AA/ROM provided inititally, but all progress to a point where no assist is needed once the Left hip is warmed up. MinA provided to standing, gait training progressed in hallway with hemiwalker, pt less antalgic in gait, less labored in effort, and gait speed clearly progression compared to prior sessions. Pt left in chair at end of session, alarm engaged, NAD, on telephone with Hinton Dyer.     Follow Up Recommendations  SNF;Supervision/Assistance - 24 hour;Supervision for mobility/OOB     Equipment Recommendations  Other (comment) (Hemiwalker)    Recommendations for Other Services       Precautions / Restrictions Precautions Precautions: Fall Required Braces or Orthoses: Sling Restrictions Weight Bearing Restrictions: Yes LUE Weight Bearing: Non weight bearing LLE Weight Bearing: Weight bearing as tolerated    Mobility  Bed Mobility               General bed mobility comments: in recliner before and after session  Transfers Overall transfer level: Needs assistance Equipment used: 1 person hand held assist Transfers: Sit to/from Stand Sit to Stand: Min assist         General transfer comment: Cueing for safety and WB precautions required t/o.  Ambulation/Gait Ambulation/Gait assistance: Min guard Gait Distance (Feet):  80 Feet Assistive device: Hemi-walker Gait Pattern/deviations: Step-to pattern Gait velocity: 0.44m/s   General Gait Details: in hall to allow for adequate room without obstacles, pt cued for HW positiong 5-6x, pt nautrally gravitates to a 2-point gait, HW moving in sync with LLE (operative extremity)   Stairs             Wheelchair Mobility    Modified Rankin (Stroke Patients Only)       Balance           Standing balance support: Single extremity supported;During functional activity Standing balance-Leahy Scale: Fair                              Cognition Arousal/Alertness: Awake/alert Behavior During Therapy: WFL for tasks assessed/performed Overall Cognitive Status: Within Functional Limits for tasks assessed                                 General Comments: a little more lucid this morning.      Exercises Other Exercises Other Exercises: LLE Heel slides, ABDCT/ADD slides, SAQ, 1x15 each with min-modA Other Exercises: Hooklying bridging 1x10    General Comments        Pertinent Vitals/Pain Faces Pain Scale: Hurts a little bit Pain Location: LUE, L Hip Pain Descriptors / Indicators: Discomfort;Grimacing;Operative site guarding Pain Intervention(s): Limited activity within patient's tolerance;Monitored during session;Repositioned    Home Living  Prior Function            PT Goals (current goals can now be found in the care plan section) Acute Rehab PT Goals Patient Stated Goal: return to PLOF PT Goal Formulation: With family Time For Goal Achievement: 05/28/20 Potential to Achieve Goals: Fair Progress towards PT goals: Progressing toward goals    Frequency    BID      PT Plan Current plan remains appropriate    Co-evaluation              AM-PAC PT "6 Clicks" Mobility   Outcome Measure  Help needed turning from your back to your side while in a flat bed without using  bedrails?: A Little Help needed moving from lying on your back to sitting on the side of a flat bed without using bedrails?: A Little Help needed moving to and from a bed to a chair (including a wheelchair)?: A Little Help needed standing up from a chair using your arms (e.g., wheelchair or bedside chair)?: A Little Help needed to walk in hospital room?: A Lot Help needed climbing 3-5 steps with a railing? : A Lot 6 Click Score: 16    End of Session Equipment Utilized During Treatment: Gait belt Activity Tolerance: Patient tolerated treatment well;No increased pain Patient left: with call bell/phone within reach;with chair alarm set;in chair Nurse Communication: Mobility status PT Visit Diagnosis: Unsteadiness on feet (R26.81);Other abnormalities of gait and mobility (R26.89);Muscle weakness (generalized) (M62.81);Difficulty in walking, not elsewhere classified (R26.2)     Time: 1020-1045 PT Time Calculation (min) (ACUTE ONLY): 25 min  Charges:  $Gait Training: 8-22 mins $Therapeutic Exercise: 8-22 mins                     11:42 AM, 05/19/20 Etta Grandchild, PT, DPT Physical Therapist - [facility]  [phone] ([place])    Tokiko Diefenderfer C 05/19/2020, 11:40 AM

## 2020-05-20 DIAGNOSIS — M8000XD Age-related osteoporosis with current pathological fracture, unspecified site, subsequent encounter for fracture with routine healing: Secondary | ICD-10-CM | POA: Diagnosis not present

## 2020-05-20 DIAGNOSIS — E785 Hyperlipidemia, unspecified: Secondary | ICD-10-CM | POA: Diagnosis not present

## 2020-05-20 DIAGNOSIS — E1169 Type 2 diabetes mellitus with other specified complication: Secondary | ICD-10-CM | POA: Diagnosis not present

## 2020-05-20 DIAGNOSIS — R4189 Other symptoms and signs involving cognitive functions and awareness: Secondary | ICD-10-CM | POA: Diagnosis not present

## 2020-06-02 DIAGNOSIS — S42292A Other displaced fracture of upper end of left humerus, initial encounter for closed fracture: Secondary | ICD-10-CM | POA: Diagnosis not present

## 2020-06-04 DIAGNOSIS — S4292XD Fracture of left shoulder girdle, part unspecified, subsequent encounter for fracture with routine healing: Secondary | ICD-10-CM | POA: Diagnosis not present

## 2020-06-04 DIAGNOSIS — S72142D Displaced intertrochanteric fracture of left femur, subsequent encounter for closed fracture with routine healing: Secondary | ICD-10-CM | POA: Diagnosis not present

## 2020-06-07 DIAGNOSIS — E785 Hyperlipidemia, unspecified: Secondary | ICD-10-CM | POA: Diagnosis not present

## 2020-06-07 DIAGNOSIS — K219 Gastro-esophageal reflux disease without esophagitis: Secondary | ICD-10-CM | POA: Diagnosis not present

## 2020-06-07 DIAGNOSIS — M80052D Age-related osteoporosis with current pathological fracture, left femur, subsequent encounter for fracture with routine healing: Secondary | ICD-10-CM | POA: Diagnosis not present

## 2020-06-07 DIAGNOSIS — E1169 Type 2 diabetes mellitus with other specified complication: Secondary | ICD-10-CM | POA: Diagnosis not present

## 2020-06-07 DIAGNOSIS — Z7984 Long term (current) use of oral hypoglycemic drugs: Secondary | ICD-10-CM | POA: Diagnosis not present

## 2020-06-07 DIAGNOSIS — D649 Anemia, unspecified: Secondary | ICD-10-CM | POA: Diagnosis not present

## 2020-06-07 DIAGNOSIS — R339 Retention of urine, unspecified: Secondary | ICD-10-CM | POA: Diagnosis not present

## 2020-06-07 DIAGNOSIS — Z9181 History of falling: Secondary | ICD-10-CM | POA: Diagnosis not present

## 2020-06-07 DIAGNOSIS — J42 Unspecified chronic bronchitis: Secondary | ICD-10-CM | POA: Diagnosis not present

## 2020-06-07 DIAGNOSIS — M47819 Spondylosis without myelopathy or radiculopathy, site unspecified: Secondary | ICD-10-CM | POA: Diagnosis not present

## 2020-06-07 DIAGNOSIS — Z7982 Long term (current) use of aspirin: Secondary | ICD-10-CM | POA: Diagnosis not present

## 2020-06-07 DIAGNOSIS — F0280 Dementia in other diseases classified elsewhere without behavioral disturbance: Secondary | ICD-10-CM | POA: Diagnosis not present

## 2020-06-07 DIAGNOSIS — F329 Major depressive disorder, single episode, unspecified: Secondary | ICD-10-CM | POA: Diagnosis not present

## 2020-06-07 DIAGNOSIS — M80012D Age-related osteoporosis with current pathological fracture, left shoulder, subsequent encounter for fracture with routine healing: Secondary | ICD-10-CM | POA: Diagnosis not present

## 2020-06-07 DIAGNOSIS — F419 Anxiety disorder, unspecified: Secondary | ICD-10-CM | POA: Diagnosis not present

## 2020-06-07 DIAGNOSIS — G309 Alzheimer's disease, unspecified: Secondary | ICD-10-CM | POA: Diagnosis not present

## 2020-06-07 DIAGNOSIS — K449 Diaphragmatic hernia without obstruction or gangrene: Secondary | ICD-10-CM | POA: Diagnosis not present

## 2020-06-07 DIAGNOSIS — I10 Essential (primary) hypertension: Secondary | ICD-10-CM | POA: Diagnosis not present

## 2020-06-07 DIAGNOSIS — K5790 Diverticulosis of intestine, part unspecified, without perforation or abscess without bleeding: Secondary | ICD-10-CM | POA: Diagnosis not present

## 2020-06-07 DIAGNOSIS — M519 Unspecified thoracic, thoracolumbar and lumbosacral intervertebral disc disorder: Secondary | ICD-10-CM | POA: Diagnosis not present

## 2020-06-09 DIAGNOSIS — K5790 Diverticulosis of intestine, part unspecified, without perforation or abscess without bleeding: Secondary | ICD-10-CM | POA: Diagnosis not present

## 2020-06-09 DIAGNOSIS — D649 Anemia, unspecified: Secondary | ICD-10-CM | POA: Diagnosis not present

## 2020-06-09 DIAGNOSIS — M80052D Age-related osteoporosis with current pathological fracture, left femur, subsequent encounter for fracture with routine healing: Secondary | ICD-10-CM | POA: Diagnosis not present

## 2020-06-09 DIAGNOSIS — G309 Alzheimer's disease, unspecified: Secondary | ICD-10-CM | POA: Diagnosis not present

## 2020-06-09 DIAGNOSIS — E1169 Type 2 diabetes mellitus with other specified complication: Secondary | ICD-10-CM | POA: Diagnosis not present

## 2020-06-09 DIAGNOSIS — F419 Anxiety disorder, unspecified: Secondary | ICD-10-CM | POA: Diagnosis not present

## 2020-06-09 DIAGNOSIS — M47819 Spondylosis without myelopathy or radiculopathy, site unspecified: Secondary | ICD-10-CM | POA: Diagnosis not present

## 2020-06-09 DIAGNOSIS — Z7984 Long term (current) use of oral hypoglycemic drugs: Secondary | ICD-10-CM | POA: Diagnosis not present

## 2020-06-09 DIAGNOSIS — M519 Unspecified thoracic, thoracolumbar and lumbosacral intervertebral disc disorder: Secondary | ICD-10-CM | POA: Diagnosis not present

## 2020-06-09 DIAGNOSIS — Z7982 Long term (current) use of aspirin: Secondary | ICD-10-CM | POA: Diagnosis not present

## 2020-06-09 DIAGNOSIS — R339 Retention of urine, unspecified: Secondary | ICD-10-CM | POA: Diagnosis not present

## 2020-06-09 DIAGNOSIS — F0280 Dementia in other diseases classified elsewhere without behavioral disturbance: Secondary | ICD-10-CM | POA: Diagnosis not present

## 2020-06-09 DIAGNOSIS — E785 Hyperlipidemia, unspecified: Secondary | ICD-10-CM | POA: Diagnosis not present

## 2020-06-09 DIAGNOSIS — K219 Gastro-esophageal reflux disease without esophagitis: Secondary | ICD-10-CM | POA: Diagnosis not present

## 2020-06-09 DIAGNOSIS — Z9181 History of falling: Secondary | ICD-10-CM | POA: Diagnosis not present

## 2020-06-09 DIAGNOSIS — K449 Diaphragmatic hernia without obstruction or gangrene: Secondary | ICD-10-CM | POA: Diagnosis not present

## 2020-06-09 DIAGNOSIS — M80012D Age-related osteoporosis with current pathological fracture, left shoulder, subsequent encounter for fracture with routine healing: Secondary | ICD-10-CM | POA: Diagnosis not present

## 2020-06-09 DIAGNOSIS — F329 Major depressive disorder, single episode, unspecified: Secondary | ICD-10-CM | POA: Diagnosis not present

## 2020-06-09 DIAGNOSIS — J42 Unspecified chronic bronchitis: Secondary | ICD-10-CM | POA: Diagnosis not present

## 2020-06-09 DIAGNOSIS — I10 Essential (primary) hypertension: Secondary | ICD-10-CM | POA: Diagnosis not present

## 2020-06-11 DIAGNOSIS — S4292XD Fracture of left shoulder girdle, part unspecified, subsequent encounter for fracture with routine healing: Secondary | ICD-10-CM | POA: Diagnosis not present

## 2020-06-11 DIAGNOSIS — Z8781 Personal history of (healed) traumatic fracture: Secondary | ICD-10-CM | POA: Diagnosis not present

## 2020-06-16 DIAGNOSIS — M80052D Age-related osteoporosis with current pathological fracture, left femur, subsequent encounter for fracture with routine healing: Secondary | ICD-10-CM | POA: Diagnosis not present

## 2020-06-16 DIAGNOSIS — E1169 Type 2 diabetes mellitus with other specified complication: Secondary | ICD-10-CM | POA: Diagnosis not present

## 2020-06-16 DIAGNOSIS — K219 Gastro-esophageal reflux disease without esophagitis: Secondary | ICD-10-CM | POA: Diagnosis not present

## 2020-06-16 DIAGNOSIS — Z9181 History of falling: Secondary | ICD-10-CM | POA: Diagnosis not present

## 2020-06-16 DIAGNOSIS — I10 Essential (primary) hypertension: Secondary | ICD-10-CM | POA: Diagnosis not present

## 2020-06-16 DIAGNOSIS — M47819 Spondylosis without myelopathy or radiculopathy, site unspecified: Secondary | ICD-10-CM | POA: Diagnosis not present

## 2020-06-16 DIAGNOSIS — K5790 Diverticulosis of intestine, part unspecified, without perforation or abscess without bleeding: Secondary | ICD-10-CM | POA: Diagnosis not present

## 2020-06-16 DIAGNOSIS — K449 Diaphragmatic hernia without obstruction or gangrene: Secondary | ICD-10-CM | POA: Diagnosis not present

## 2020-06-16 DIAGNOSIS — G309 Alzheimer's disease, unspecified: Secondary | ICD-10-CM | POA: Diagnosis not present

## 2020-06-16 DIAGNOSIS — M80012D Age-related osteoporosis with current pathological fracture, left shoulder, subsequent encounter for fracture with routine healing: Secondary | ICD-10-CM | POA: Diagnosis not present

## 2020-06-16 DIAGNOSIS — F329 Major depressive disorder, single episode, unspecified: Secondary | ICD-10-CM | POA: Diagnosis not present

## 2020-06-16 DIAGNOSIS — J42 Unspecified chronic bronchitis: Secondary | ICD-10-CM | POA: Diagnosis not present

## 2020-06-16 DIAGNOSIS — E785 Hyperlipidemia, unspecified: Secondary | ICD-10-CM | POA: Diagnosis not present

## 2020-06-16 DIAGNOSIS — Z7982 Long term (current) use of aspirin: Secondary | ICD-10-CM | POA: Diagnosis not present

## 2020-06-16 DIAGNOSIS — R339 Retention of urine, unspecified: Secondary | ICD-10-CM | POA: Diagnosis not present

## 2020-06-16 DIAGNOSIS — M519 Unspecified thoracic, thoracolumbar and lumbosacral intervertebral disc disorder: Secondary | ICD-10-CM | POA: Diagnosis not present

## 2020-06-16 DIAGNOSIS — D649 Anemia, unspecified: Secondary | ICD-10-CM | POA: Diagnosis not present

## 2020-06-16 DIAGNOSIS — F419 Anxiety disorder, unspecified: Secondary | ICD-10-CM | POA: Diagnosis not present

## 2020-06-16 DIAGNOSIS — F0280 Dementia in other diseases classified elsewhere without behavioral disturbance: Secondary | ICD-10-CM | POA: Diagnosis not present

## 2020-06-16 DIAGNOSIS — Z7984 Long term (current) use of oral hypoglycemic drugs: Secondary | ICD-10-CM | POA: Diagnosis not present

## 2020-06-23 DIAGNOSIS — Z8781 Personal history of (healed) traumatic fracture: Secondary | ICD-10-CM | POA: Diagnosis not present

## 2020-06-23 DIAGNOSIS — Z9889 Other specified postprocedural states: Secondary | ICD-10-CM | POA: Diagnosis not present

## 2020-06-29 DIAGNOSIS — F0391 Unspecified dementia with behavioral disturbance: Secondary | ICD-10-CM | POA: Diagnosis not present

## 2020-07-05 DIAGNOSIS — E785 Hyperlipidemia, unspecified: Secondary | ICD-10-CM | POA: Diagnosis not present

## 2020-07-05 DIAGNOSIS — K5790 Diverticulosis of intestine, part unspecified, without perforation or abscess without bleeding: Secondary | ICD-10-CM | POA: Diagnosis not present

## 2020-07-05 DIAGNOSIS — Z7984 Long term (current) use of oral hypoglycemic drugs: Secondary | ICD-10-CM | POA: Diagnosis not present

## 2020-07-05 DIAGNOSIS — M519 Unspecified thoracic, thoracolumbar and lumbosacral intervertebral disc disorder: Secondary | ICD-10-CM | POA: Diagnosis not present

## 2020-07-05 DIAGNOSIS — I10 Essential (primary) hypertension: Secondary | ICD-10-CM | POA: Diagnosis not present

## 2020-07-05 DIAGNOSIS — K449 Diaphragmatic hernia without obstruction or gangrene: Secondary | ICD-10-CM | POA: Diagnosis not present

## 2020-07-05 DIAGNOSIS — E1169 Type 2 diabetes mellitus with other specified complication: Secondary | ICD-10-CM | POA: Diagnosis not present

## 2020-07-05 DIAGNOSIS — G309 Alzheimer's disease, unspecified: Secondary | ICD-10-CM | POA: Diagnosis not present

## 2020-07-05 DIAGNOSIS — M47819 Spondylosis without myelopathy or radiculopathy, site unspecified: Secondary | ICD-10-CM | POA: Diagnosis not present

## 2020-07-05 DIAGNOSIS — R339 Retention of urine, unspecified: Secondary | ICD-10-CM | POA: Diagnosis not present

## 2020-07-05 DIAGNOSIS — K219 Gastro-esophageal reflux disease without esophagitis: Secondary | ICD-10-CM | POA: Diagnosis not present

## 2020-07-05 DIAGNOSIS — F419 Anxiety disorder, unspecified: Secondary | ICD-10-CM | POA: Diagnosis not present

## 2020-07-05 DIAGNOSIS — Z9181 History of falling: Secondary | ICD-10-CM | POA: Diagnosis not present

## 2020-07-05 DIAGNOSIS — Z7982 Long term (current) use of aspirin: Secondary | ICD-10-CM | POA: Diagnosis not present

## 2020-07-05 DIAGNOSIS — F0280 Dementia in other diseases classified elsewhere without behavioral disturbance: Secondary | ICD-10-CM | POA: Diagnosis not present

## 2020-07-05 DIAGNOSIS — D649 Anemia, unspecified: Secondary | ICD-10-CM | POA: Diagnosis not present

## 2020-07-05 DIAGNOSIS — M80012D Age-related osteoporosis with current pathological fracture, left shoulder, subsequent encounter for fracture with routine healing: Secondary | ICD-10-CM | POA: Diagnosis not present

## 2020-07-05 DIAGNOSIS — J42 Unspecified chronic bronchitis: Secondary | ICD-10-CM | POA: Diagnosis not present

## 2020-07-05 DIAGNOSIS — M80052D Age-related osteoporosis with current pathological fracture, left femur, subsequent encounter for fracture with routine healing: Secondary | ICD-10-CM | POA: Diagnosis not present

## 2020-07-05 DIAGNOSIS — F329 Major depressive disorder, single episode, unspecified: Secondary | ICD-10-CM | POA: Diagnosis not present

## 2020-07-06 DIAGNOSIS — K5790 Diverticulosis of intestine, part unspecified, without perforation or abscess without bleeding: Secondary | ICD-10-CM | POA: Diagnosis not present

## 2020-07-06 DIAGNOSIS — K219 Gastro-esophageal reflux disease without esophagitis: Secondary | ICD-10-CM | POA: Diagnosis not present

## 2020-07-06 DIAGNOSIS — E1169 Type 2 diabetes mellitus with other specified complication: Secondary | ICD-10-CM | POA: Diagnosis not present

## 2020-07-06 DIAGNOSIS — M47819 Spondylosis without myelopathy or radiculopathy, site unspecified: Secondary | ICD-10-CM | POA: Diagnosis not present

## 2020-07-06 DIAGNOSIS — K449 Diaphragmatic hernia without obstruction or gangrene: Secondary | ICD-10-CM | POA: Diagnosis not present

## 2020-07-06 DIAGNOSIS — M80012D Age-related osteoporosis with current pathological fracture, left shoulder, subsequent encounter for fracture with routine healing: Secondary | ICD-10-CM | POA: Diagnosis not present

## 2020-07-06 DIAGNOSIS — F0280 Dementia in other diseases classified elsewhere without behavioral disturbance: Secondary | ICD-10-CM | POA: Diagnosis not present

## 2020-07-06 DIAGNOSIS — M80052D Age-related osteoporosis with current pathological fracture, left femur, subsequent encounter for fracture with routine healing: Secondary | ICD-10-CM | POA: Diagnosis not present

## 2020-07-06 DIAGNOSIS — Z7984 Long term (current) use of oral hypoglycemic drugs: Secondary | ICD-10-CM | POA: Diagnosis not present

## 2020-07-06 DIAGNOSIS — I10 Essential (primary) hypertension: Secondary | ICD-10-CM | POA: Diagnosis not present

## 2020-07-06 DIAGNOSIS — R339 Retention of urine, unspecified: Secondary | ICD-10-CM | POA: Diagnosis not present

## 2020-07-06 DIAGNOSIS — F419 Anxiety disorder, unspecified: Secondary | ICD-10-CM | POA: Diagnosis not present

## 2020-07-06 DIAGNOSIS — F329 Major depressive disorder, single episode, unspecified: Secondary | ICD-10-CM | POA: Diagnosis not present

## 2020-07-06 DIAGNOSIS — J42 Unspecified chronic bronchitis: Secondary | ICD-10-CM | POA: Diagnosis not present

## 2020-07-06 DIAGNOSIS — Z9181 History of falling: Secondary | ICD-10-CM | POA: Diagnosis not present

## 2020-07-06 DIAGNOSIS — E785 Hyperlipidemia, unspecified: Secondary | ICD-10-CM | POA: Diagnosis not present

## 2020-07-06 DIAGNOSIS — D649 Anemia, unspecified: Secondary | ICD-10-CM | POA: Diagnosis not present

## 2020-07-06 DIAGNOSIS — G309 Alzheimer's disease, unspecified: Secondary | ICD-10-CM | POA: Diagnosis not present

## 2020-07-06 DIAGNOSIS — Z7982 Long term (current) use of aspirin: Secondary | ICD-10-CM | POA: Diagnosis not present

## 2020-07-06 DIAGNOSIS — M519 Unspecified thoracic, thoracolumbar and lumbosacral intervertebral disc disorder: Secondary | ICD-10-CM | POA: Diagnosis not present

## 2020-07-07 DIAGNOSIS — F329 Major depressive disorder, single episode, unspecified: Secondary | ICD-10-CM | POA: Diagnosis not present

## 2020-07-07 DIAGNOSIS — M519 Unspecified thoracic, thoracolumbar and lumbosacral intervertebral disc disorder: Secondary | ICD-10-CM | POA: Diagnosis not present

## 2020-07-07 DIAGNOSIS — Z9181 History of falling: Secondary | ICD-10-CM | POA: Diagnosis not present

## 2020-07-07 DIAGNOSIS — K449 Diaphragmatic hernia without obstruction or gangrene: Secondary | ICD-10-CM | POA: Diagnosis not present

## 2020-07-07 DIAGNOSIS — E785 Hyperlipidemia, unspecified: Secondary | ICD-10-CM | POA: Diagnosis not present

## 2020-07-07 DIAGNOSIS — G309 Alzheimer's disease, unspecified: Secondary | ICD-10-CM | POA: Diagnosis not present

## 2020-07-07 DIAGNOSIS — M80052D Age-related osteoporosis with current pathological fracture, left femur, subsequent encounter for fracture with routine healing: Secondary | ICD-10-CM | POA: Diagnosis not present

## 2020-07-07 DIAGNOSIS — Z7982 Long term (current) use of aspirin: Secondary | ICD-10-CM | POA: Diagnosis not present

## 2020-07-07 DIAGNOSIS — F0280 Dementia in other diseases classified elsewhere without behavioral disturbance: Secondary | ICD-10-CM | POA: Diagnosis not present

## 2020-07-07 DIAGNOSIS — K5790 Diverticulosis of intestine, part unspecified, without perforation or abscess without bleeding: Secondary | ICD-10-CM | POA: Diagnosis not present

## 2020-07-07 DIAGNOSIS — K219 Gastro-esophageal reflux disease without esophagitis: Secondary | ICD-10-CM | POA: Diagnosis not present

## 2020-07-07 DIAGNOSIS — M47819 Spondylosis without myelopathy or radiculopathy, site unspecified: Secondary | ICD-10-CM | POA: Diagnosis not present

## 2020-07-07 DIAGNOSIS — E1169 Type 2 diabetes mellitus with other specified complication: Secondary | ICD-10-CM | POA: Diagnosis not present

## 2020-07-07 DIAGNOSIS — R339 Retention of urine, unspecified: Secondary | ICD-10-CM | POA: Diagnosis not present

## 2020-07-07 DIAGNOSIS — F419 Anxiety disorder, unspecified: Secondary | ICD-10-CM | POA: Diagnosis not present

## 2020-07-07 DIAGNOSIS — J42 Unspecified chronic bronchitis: Secondary | ICD-10-CM | POA: Diagnosis not present

## 2020-07-07 DIAGNOSIS — I10 Essential (primary) hypertension: Secondary | ICD-10-CM | POA: Diagnosis not present

## 2020-07-07 DIAGNOSIS — Z7984 Long term (current) use of oral hypoglycemic drugs: Secondary | ICD-10-CM | POA: Diagnosis not present

## 2020-07-07 DIAGNOSIS — M80012D Age-related osteoporosis with current pathological fracture, left shoulder, subsequent encounter for fracture with routine healing: Secondary | ICD-10-CM | POA: Diagnosis not present

## 2020-07-07 DIAGNOSIS — D649 Anemia, unspecified: Secondary | ICD-10-CM | POA: Diagnosis not present

## 2020-07-08 IMAGING — MR MR HEAD W/O CM
9 of 12 series · 30 of 48 positions shown · non-contrast
Comparison: None.

CLINICAL DATA: Cognitive impairment

EXAM:
MRI HEAD WITHOUT CONTRAST
TECHNIQUE: Multiplanar, multiecho pulse sequences of the brain and surrounding
structures were obtained without intravenous contrast.
Additionally, using NeuroQuant software a 3D volumetric analysis of
the brain was performed and is compared to a normative database
adjusted for age, gender and intracranial volume.

[Series 2: FLAIR · sagittal · 5.0mm · 0.47mm/px · 2 of 25 slices shown (1 of 2)]
[im 1/25]
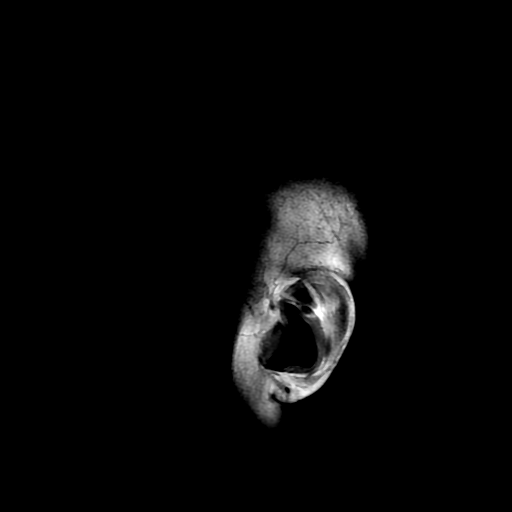
[im 25/25]
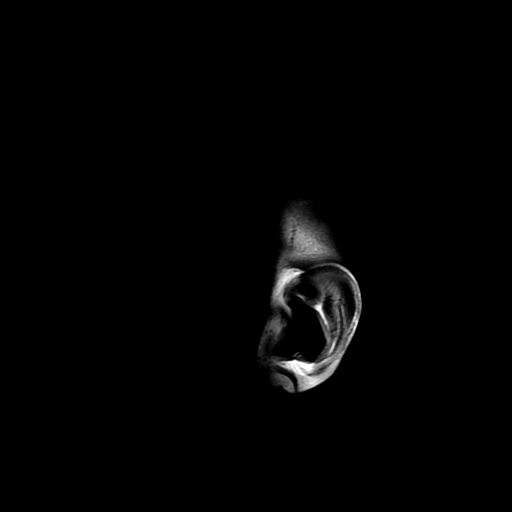

[Series 3: DWI · axial · 3.0mm · 0.94mm/px · z∈[-76,+98]mm · 7 of 118 slices shown (1 of 2)]
[im 1/118]
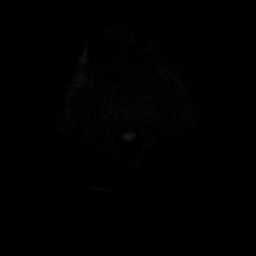
[im 20/118]
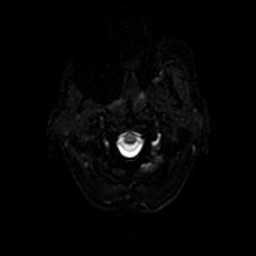
[im 40/118]
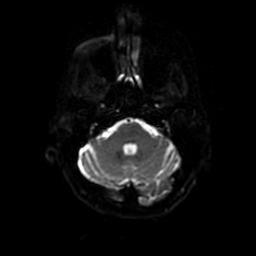
[im 59/118]
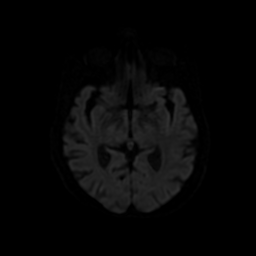
[im 79/118]
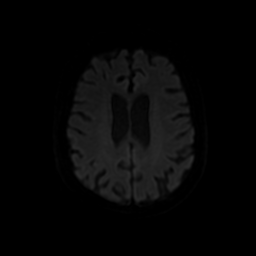
[im 98/118]
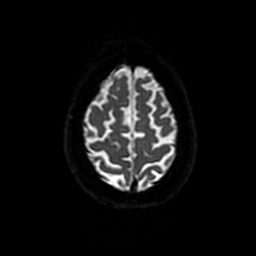
[im 118/118]
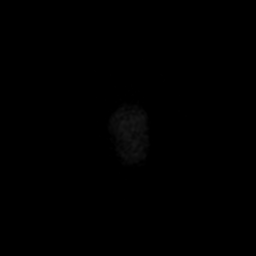

[Series 4: T2 · axial · 5.0mm · 0.47mm/px · z∈[-90,+102]mm · 2 of 33 slices shown (1 of 2)]
[im 1/33]
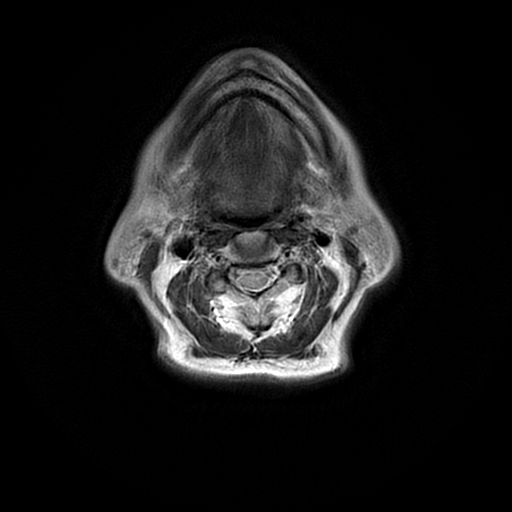
[im 33/33]
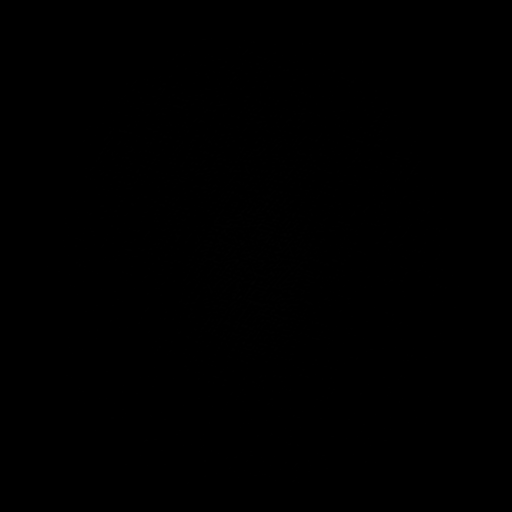

[Series 5: FLAIR · axial · 4.0mm · 0.41mm/px · z∈[-81,+95]mm · 2 of 33 slices shown (2 of 2)]
[im 1/33]
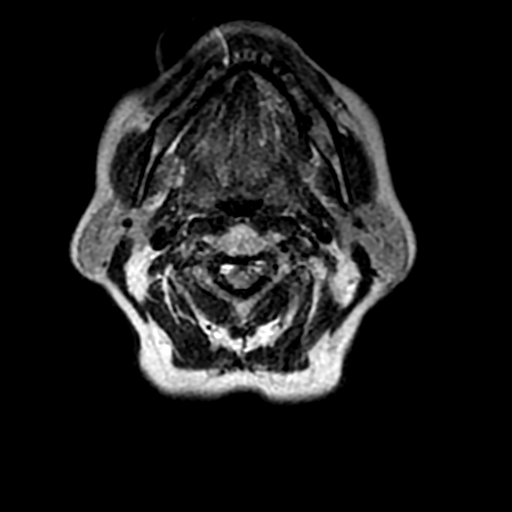
[im 33/33]
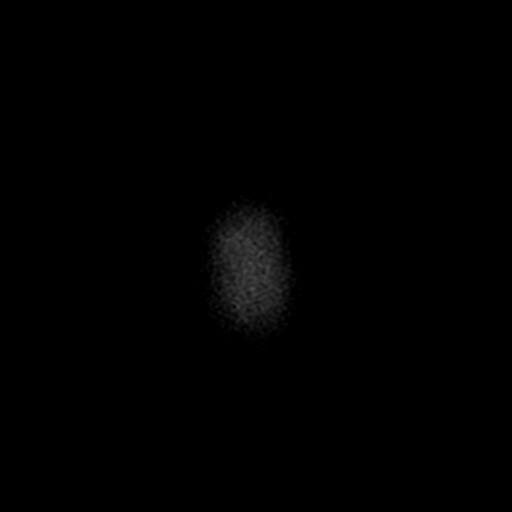

[Series 6: DWI · coronal · 4.0mm · 0.94mm/px · 4 of 80 slices shown (2 of 2)]
[im 1/80]
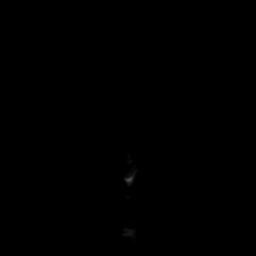
[im 27/80]
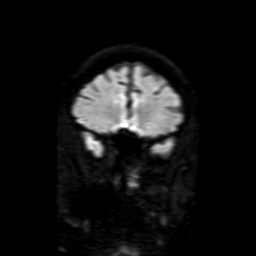
[im 53/80]
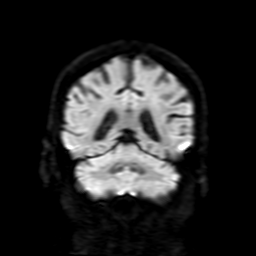
[im 80/80]
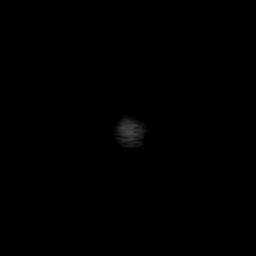

[Series 7: SWI · axial · 3.0mm · 0.47mm/px · z∈[-75,+98]mm · 6 of 116 slices shown]
[im 1/116]
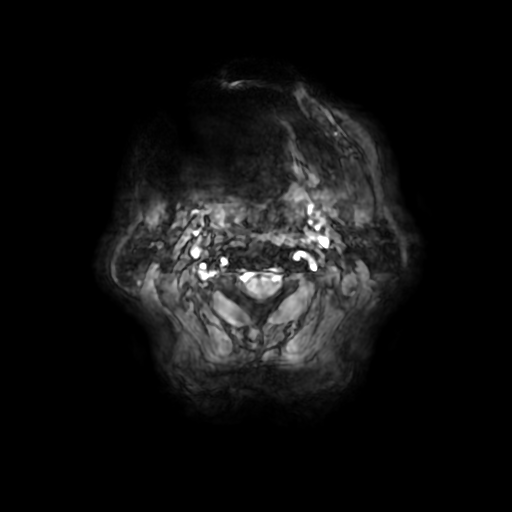
[im 24/116]
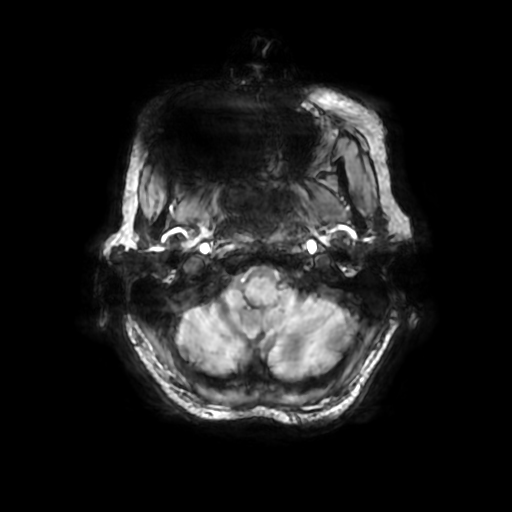
[im 47/116]
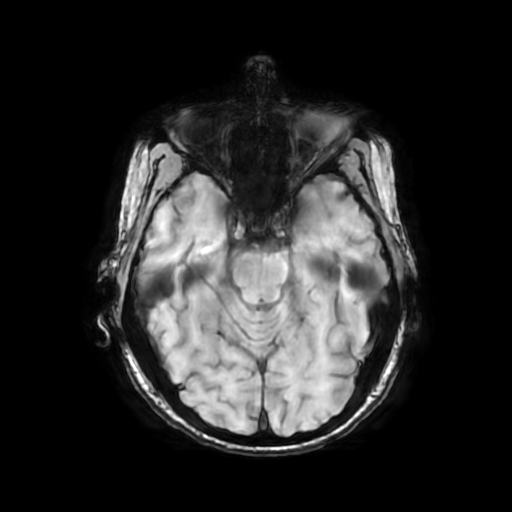
[im 70/116]
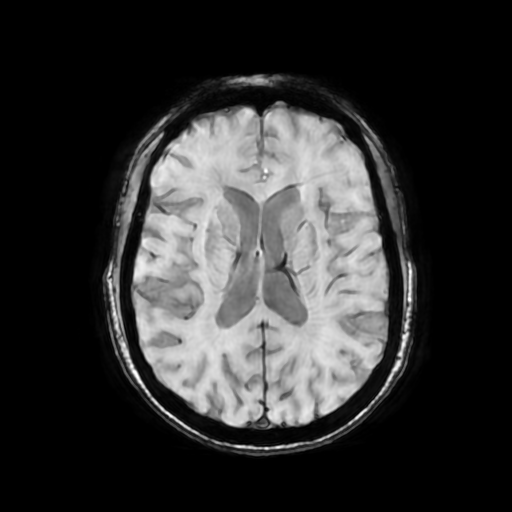
[im 93/116]
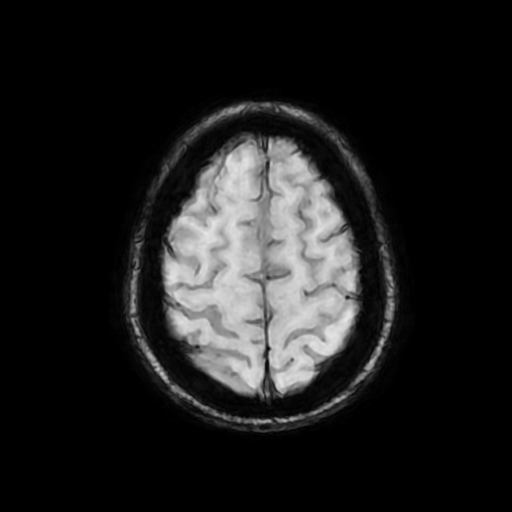
[im 116/116]
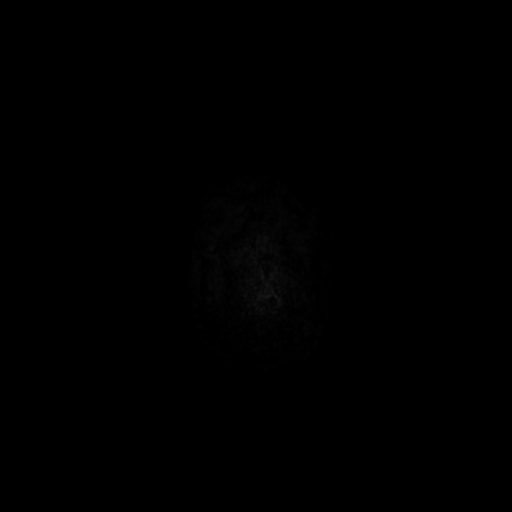

[Series 9: T2 · coronal · 5.0mm · 0.39mm/px · 2 of 34 slices shown (2 of 2)]
[im 1/34]
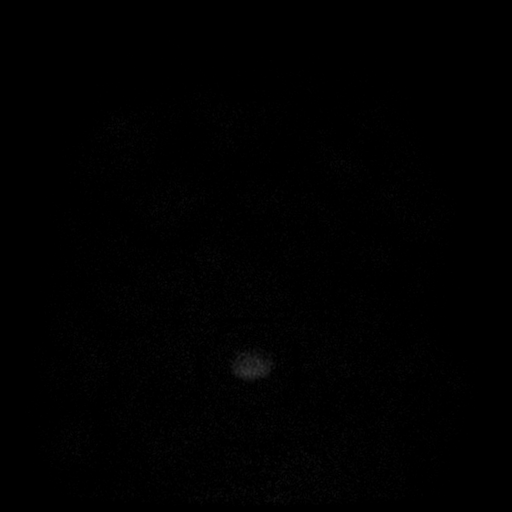
[im 34/34]
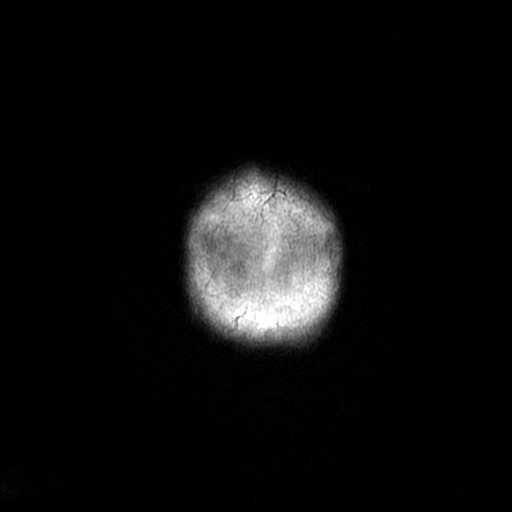

[Series 350: ADC · axial · 3.0mm · 0.94mm/px · z∈[-76,+98]mm · 3 of 59 slices shown (1 of 2)]
[im 1/59]
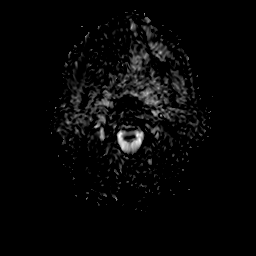
[im 30/59]
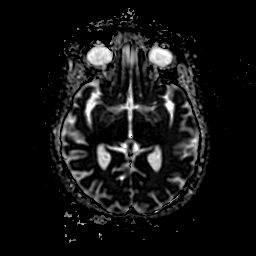
[im 59/59]
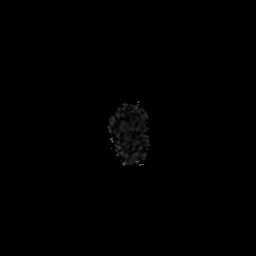

[Series 650: ADC · coronal · 4.0mm · 0.94mm/px · 2 of 40 slices shown (2 of 2)]
[im 1/40]
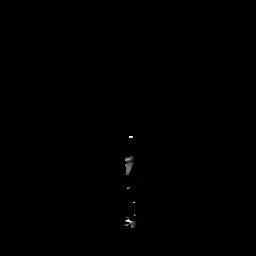
[im 40/40]
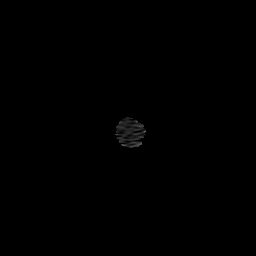

[30 of 48 positions shown; findings below may reference images not displayed]

FINDINGS: Brain: No infarction, hemorrhage, hydrocephalus, extra-axial
collection or mass lesion. There is mild cortical atrophy that is
generalized. Medial temporal volume is preserved. Scant white matter
T2 hyperintensity, less than typical for age.

Vascular: Normal flow voids

Skull and upper cervical spine: Degenerative facet spurring on the
left at C3-4.

Sinuses/Orbits: Negative

NeuroQuant Findings:

Volumetric analysis of the brain was performed, with a fully
detailed report in [HOSPITAL] PACS. Briefly, the comparison with age and
gender matched reference reveals good correlation with subjective
assessment. There is preserved brain volume and less than typical
white matter disease for age. Cortical volume registers low, but
there is poor tagging at the vertex and the patient is likely within
the normal range.
IMPRESSION: 1. Unremarkable brain MRI for age.  No reversible finding.
2. NeuroQuant volumetric analysis of the brain, as above.

## 2020-07-09 DIAGNOSIS — G309 Alzheimer's disease, unspecified: Secondary | ICD-10-CM | POA: Diagnosis not present

## 2020-07-09 DIAGNOSIS — F419 Anxiety disorder, unspecified: Secondary | ICD-10-CM | POA: Diagnosis not present

## 2020-07-09 DIAGNOSIS — I10 Essential (primary) hypertension: Secondary | ICD-10-CM | POA: Diagnosis not present

## 2020-07-09 DIAGNOSIS — F329 Major depressive disorder, single episode, unspecified: Secondary | ICD-10-CM | POA: Diagnosis not present

## 2020-07-09 DIAGNOSIS — M80052D Age-related osteoporosis with current pathological fracture, left femur, subsequent encounter for fracture with routine healing: Secondary | ICD-10-CM | POA: Diagnosis not present

## 2020-07-09 DIAGNOSIS — R339 Retention of urine, unspecified: Secondary | ICD-10-CM | POA: Diagnosis not present

## 2020-07-09 DIAGNOSIS — J42 Unspecified chronic bronchitis: Secondary | ICD-10-CM | POA: Diagnosis not present

## 2020-07-09 DIAGNOSIS — K219 Gastro-esophageal reflux disease without esophagitis: Secondary | ICD-10-CM | POA: Diagnosis not present

## 2020-07-09 DIAGNOSIS — M519 Unspecified thoracic, thoracolumbar and lumbosacral intervertebral disc disorder: Secondary | ICD-10-CM | POA: Diagnosis not present

## 2020-07-09 DIAGNOSIS — K5790 Diverticulosis of intestine, part unspecified, without perforation or abscess without bleeding: Secondary | ICD-10-CM | POA: Diagnosis not present

## 2020-07-09 DIAGNOSIS — D649 Anemia, unspecified: Secondary | ICD-10-CM | POA: Diagnosis not present

## 2020-07-09 DIAGNOSIS — E785 Hyperlipidemia, unspecified: Secondary | ICD-10-CM | POA: Diagnosis not present

## 2020-07-09 DIAGNOSIS — Z7982 Long term (current) use of aspirin: Secondary | ICD-10-CM | POA: Diagnosis not present

## 2020-07-09 DIAGNOSIS — Z7984 Long term (current) use of oral hypoglycemic drugs: Secondary | ICD-10-CM | POA: Diagnosis not present

## 2020-07-09 DIAGNOSIS — E1169 Type 2 diabetes mellitus with other specified complication: Secondary | ICD-10-CM | POA: Diagnosis not present

## 2020-07-09 DIAGNOSIS — Z9181 History of falling: Secondary | ICD-10-CM | POA: Diagnosis not present

## 2020-07-09 DIAGNOSIS — M47819 Spondylosis without myelopathy or radiculopathy, site unspecified: Secondary | ICD-10-CM | POA: Diagnosis not present

## 2020-07-09 DIAGNOSIS — F0280 Dementia in other diseases classified elsewhere without behavioral disturbance: Secondary | ICD-10-CM | POA: Diagnosis not present

## 2020-07-09 DIAGNOSIS — K449 Diaphragmatic hernia without obstruction or gangrene: Secondary | ICD-10-CM | POA: Diagnosis not present

## 2020-07-09 DIAGNOSIS — M80012D Age-related osteoporosis with current pathological fracture, left shoulder, subsequent encounter for fracture with routine healing: Secondary | ICD-10-CM | POA: Diagnosis not present

## 2020-07-16 DIAGNOSIS — G309 Alzheimer's disease, unspecified: Secondary | ICD-10-CM | POA: Diagnosis not present

## 2020-07-16 DIAGNOSIS — F419 Anxiety disorder, unspecified: Secondary | ICD-10-CM | POA: Diagnosis not present

## 2020-07-16 DIAGNOSIS — M47819 Spondylosis without myelopathy or radiculopathy, site unspecified: Secondary | ICD-10-CM | POA: Diagnosis not present

## 2020-07-16 DIAGNOSIS — R339 Retention of urine, unspecified: Secondary | ICD-10-CM | POA: Diagnosis not present

## 2020-07-16 DIAGNOSIS — J42 Unspecified chronic bronchitis: Secondary | ICD-10-CM | POA: Diagnosis not present

## 2020-07-16 DIAGNOSIS — Z7984 Long term (current) use of oral hypoglycemic drugs: Secondary | ICD-10-CM | POA: Diagnosis not present

## 2020-07-16 DIAGNOSIS — M519 Unspecified thoracic, thoracolumbar and lumbosacral intervertebral disc disorder: Secondary | ICD-10-CM | POA: Diagnosis not present

## 2020-07-16 DIAGNOSIS — E1169 Type 2 diabetes mellitus with other specified complication: Secondary | ICD-10-CM | POA: Diagnosis not present

## 2020-07-16 DIAGNOSIS — M80052D Age-related osteoporosis with current pathological fracture, left femur, subsequent encounter for fracture with routine healing: Secondary | ICD-10-CM | POA: Diagnosis not present

## 2020-07-16 DIAGNOSIS — M80012D Age-related osteoporosis with current pathological fracture, left shoulder, subsequent encounter for fracture with routine healing: Secondary | ICD-10-CM | POA: Diagnosis not present

## 2020-07-16 DIAGNOSIS — F329 Major depressive disorder, single episode, unspecified: Secondary | ICD-10-CM | POA: Diagnosis not present

## 2020-07-16 DIAGNOSIS — K219 Gastro-esophageal reflux disease without esophagitis: Secondary | ICD-10-CM | POA: Diagnosis not present

## 2020-07-16 DIAGNOSIS — Z9181 History of falling: Secondary | ICD-10-CM | POA: Diagnosis not present

## 2020-07-16 DIAGNOSIS — Z7982 Long term (current) use of aspirin: Secondary | ICD-10-CM | POA: Diagnosis not present

## 2020-07-16 DIAGNOSIS — K449 Diaphragmatic hernia without obstruction or gangrene: Secondary | ICD-10-CM | POA: Diagnosis not present

## 2020-07-16 DIAGNOSIS — D649 Anemia, unspecified: Secondary | ICD-10-CM | POA: Diagnosis not present

## 2020-07-16 DIAGNOSIS — I10 Essential (primary) hypertension: Secondary | ICD-10-CM | POA: Diagnosis not present

## 2020-07-16 DIAGNOSIS — K5790 Diverticulosis of intestine, part unspecified, without perforation or abscess without bleeding: Secondary | ICD-10-CM | POA: Diagnosis not present

## 2020-07-16 DIAGNOSIS — F0280 Dementia in other diseases classified elsewhere without behavioral disturbance: Secondary | ICD-10-CM | POA: Diagnosis not present

## 2020-07-16 DIAGNOSIS — E785 Hyperlipidemia, unspecified: Secondary | ICD-10-CM | POA: Diagnosis not present

## 2020-07-23 DIAGNOSIS — F0391 Unspecified dementia with behavioral disturbance: Secondary | ICD-10-CM | POA: Diagnosis not present

## 2020-11-19 DIAGNOSIS — E785 Hyperlipidemia, unspecified: Secondary | ICD-10-CM | POA: Diagnosis not present

## 2020-11-19 DIAGNOSIS — E1169 Type 2 diabetes mellitus with other specified complication: Secondary | ICD-10-CM | POA: Diagnosis not present

## 2020-11-26 DIAGNOSIS — E1169 Type 2 diabetes mellitus with other specified complication: Secondary | ICD-10-CM | POA: Diagnosis not present

## 2020-11-26 DIAGNOSIS — N1831 Chronic kidney disease, stage 3a: Secondary | ICD-10-CM | POA: Diagnosis not present

## 2020-11-26 DIAGNOSIS — E785 Hyperlipidemia, unspecified: Secondary | ICD-10-CM | POA: Diagnosis not present

## 2021-03-23 DIAGNOSIS — N1831 Chronic kidney disease, stage 3a: Secondary | ICD-10-CM | POA: Diagnosis not present

## 2021-03-23 DIAGNOSIS — E1169 Type 2 diabetes mellitus with other specified complication: Secondary | ICD-10-CM | POA: Diagnosis not present

## 2021-03-23 DIAGNOSIS — E785 Hyperlipidemia, unspecified: Secondary | ICD-10-CM | POA: Diagnosis not present

## 2021-03-29 DIAGNOSIS — E785 Hyperlipidemia, unspecified: Secondary | ICD-10-CM | POA: Diagnosis not present

## 2021-03-29 DIAGNOSIS — G8929 Other chronic pain: Secondary | ICD-10-CM | POA: Diagnosis not present

## 2021-03-29 DIAGNOSIS — M25512 Pain in left shoulder: Secondary | ICD-10-CM | POA: Diagnosis not present

## 2021-03-29 DIAGNOSIS — F4321 Adjustment disorder with depressed mood: Secondary | ICD-10-CM | POA: Diagnosis not present

## 2021-03-29 DIAGNOSIS — E1169 Type 2 diabetes mellitus with other specified complication: Secondary | ICD-10-CM | POA: Diagnosis not present

## 2021-04-01 DIAGNOSIS — Z8781 Personal history of (healed) traumatic fracture: Secondary | ICD-10-CM | POA: Diagnosis not present

## 2021-04-01 DIAGNOSIS — Z9181 History of falling: Secondary | ICD-10-CM | POA: Diagnosis not present

## 2021-04-01 DIAGNOSIS — G8929 Other chronic pain: Secondary | ICD-10-CM | POA: Diagnosis not present

## 2021-04-01 DIAGNOSIS — Z7984 Long term (current) use of oral hypoglycemic drugs: Secondary | ICD-10-CM | POA: Diagnosis not present

## 2021-04-01 DIAGNOSIS — Z7982 Long term (current) use of aspirin: Secondary | ICD-10-CM | POA: Diagnosis not present

## 2021-04-01 DIAGNOSIS — K219 Gastro-esophageal reflux disease without esophagitis: Secondary | ICD-10-CM | POA: Diagnosis not present

## 2021-04-01 DIAGNOSIS — E785 Hyperlipidemia, unspecified: Secondary | ICD-10-CM | POA: Diagnosis not present

## 2021-04-01 DIAGNOSIS — M8588 Other specified disorders of bone density and structure, other site: Secondary | ICD-10-CM | POA: Diagnosis not present

## 2021-04-01 DIAGNOSIS — M25512 Pain in left shoulder: Secondary | ICD-10-CM | POA: Diagnosis not present

## 2021-04-01 DIAGNOSIS — E1169 Type 2 diabetes mellitus with other specified complication: Secondary | ICD-10-CM | POA: Diagnosis not present

## 2021-04-01 DIAGNOSIS — F4321 Adjustment disorder with depressed mood: Secondary | ICD-10-CM | POA: Diagnosis not present

## 2021-04-01 DIAGNOSIS — M5136 Other intervertebral disc degeneration, lumbar region: Secondary | ICD-10-CM | POA: Diagnosis not present

## 2021-04-05 DIAGNOSIS — M25512 Pain in left shoulder: Secondary | ICD-10-CM | POA: Diagnosis not present

## 2021-04-05 DIAGNOSIS — E1169 Type 2 diabetes mellitus with other specified complication: Secondary | ICD-10-CM | POA: Diagnosis not present

## 2021-04-05 DIAGNOSIS — E785 Hyperlipidemia, unspecified: Secondary | ICD-10-CM | POA: Diagnosis not present

## 2021-04-05 DIAGNOSIS — M8588 Other specified disorders of bone density and structure, other site: Secondary | ICD-10-CM | POA: Diagnosis not present

## 2021-04-05 DIAGNOSIS — M5136 Other intervertebral disc degeneration, lumbar region: Secondary | ICD-10-CM | POA: Diagnosis not present

## 2021-04-05 DIAGNOSIS — F4321 Adjustment disorder with depressed mood: Secondary | ICD-10-CM | POA: Diagnosis not present

## 2021-04-05 DIAGNOSIS — G8929 Other chronic pain: Secondary | ICD-10-CM | POA: Diagnosis not present

## 2021-04-13 DIAGNOSIS — M25512 Pain in left shoulder: Secondary | ICD-10-CM | POA: Diagnosis not present

## 2021-04-13 DIAGNOSIS — F4321 Adjustment disorder with depressed mood: Secondary | ICD-10-CM | POA: Diagnosis not present

## 2021-04-13 DIAGNOSIS — E785 Hyperlipidemia, unspecified: Secondary | ICD-10-CM | POA: Diagnosis not present

## 2021-04-13 DIAGNOSIS — G8929 Other chronic pain: Secondary | ICD-10-CM | POA: Diagnosis not present

## 2021-04-13 DIAGNOSIS — Z7984 Long term (current) use of oral hypoglycemic drugs: Secondary | ICD-10-CM | POA: Diagnosis not present

## 2021-04-13 DIAGNOSIS — M5136 Other intervertebral disc degeneration, lumbar region: Secondary | ICD-10-CM | POA: Diagnosis not present

## 2021-04-13 DIAGNOSIS — E1169 Type 2 diabetes mellitus with other specified complication: Secondary | ICD-10-CM | POA: Diagnosis not present

## 2021-04-13 DIAGNOSIS — Z9181 History of falling: Secondary | ICD-10-CM | POA: Diagnosis not present

## 2021-04-13 DIAGNOSIS — Z7982 Long term (current) use of aspirin: Secondary | ICD-10-CM | POA: Diagnosis not present

## 2021-04-13 DIAGNOSIS — Z8781 Personal history of (healed) traumatic fracture: Secondary | ICD-10-CM | POA: Diagnosis not present

## 2021-04-13 DIAGNOSIS — K219 Gastro-esophageal reflux disease without esophagitis: Secondary | ICD-10-CM | POA: Diagnosis not present

## 2021-04-13 DIAGNOSIS — M8588 Other specified disorders of bone density and structure, other site: Secondary | ICD-10-CM | POA: Diagnosis not present

## 2021-04-14 DIAGNOSIS — F4321 Adjustment disorder with depressed mood: Secondary | ICD-10-CM | POA: Diagnosis not present

## 2021-04-14 DIAGNOSIS — G8929 Other chronic pain: Secondary | ICD-10-CM | POA: Diagnosis not present

## 2021-04-14 DIAGNOSIS — K219 Gastro-esophageal reflux disease without esophagitis: Secondary | ICD-10-CM | POA: Diagnosis not present

## 2021-04-14 DIAGNOSIS — M5136 Other intervertebral disc degeneration, lumbar region: Secondary | ICD-10-CM | POA: Diagnosis not present

## 2021-04-14 DIAGNOSIS — Z8781 Personal history of (healed) traumatic fracture: Secondary | ICD-10-CM | POA: Diagnosis not present

## 2021-04-14 DIAGNOSIS — E1169 Type 2 diabetes mellitus with other specified complication: Secondary | ICD-10-CM | POA: Diagnosis not present

## 2021-04-14 DIAGNOSIS — E785 Hyperlipidemia, unspecified: Secondary | ICD-10-CM | POA: Diagnosis not present

## 2021-04-14 DIAGNOSIS — M8588 Other specified disorders of bone density and structure, other site: Secondary | ICD-10-CM | POA: Diagnosis not present

## 2021-04-14 DIAGNOSIS — M25512 Pain in left shoulder: Secondary | ICD-10-CM | POA: Diagnosis not present

## 2021-04-14 DIAGNOSIS — Z9181 History of falling: Secondary | ICD-10-CM | POA: Diagnosis not present

## 2021-04-14 DIAGNOSIS — Z7984 Long term (current) use of oral hypoglycemic drugs: Secondary | ICD-10-CM | POA: Diagnosis not present

## 2021-04-14 DIAGNOSIS — Z7982 Long term (current) use of aspirin: Secondary | ICD-10-CM | POA: Diagnosis not present

## 2021-05-05 DIAGNOSIS — E1169 Type 2 diabetes mellitus with other specified complication: Secondary | ICD-10-CM | POA: Diagnosis not present

## 2021-05-05 DIAGNOSIS — M8588 Other specified disorders of bone density and structure, other site: Secondary | ICD-10-CM | POA: Diagnosis not present

## 2021-05-05 DIAGNOSIS — M5136 Other intervertebral disc degeneration, lumbar region: Secondary | ICD-10-CM | POA: Diagnosis not present

## 2021-05-05 DIAGNOSIS — F4321 Adjustment disorder with depressed mood: Secondary | ICD-10-CM | POA: Diagnosis not present

## 2021-05-05 DIAGNOSIS — M25512 Pain in left shoulder: Secondary | ICD-10-CM | POA: Diagnosis not present

## 2021-05-05 DIAGNOSIS — Z8781 Personal history of (healed) traumatic fracture: Secondary | ICD-10-CM | POA: Diagnosis not present

## 2021-05-05 DIAGNOSIS — E785 Hyperlipidemia, unspecified: Secondary | ICD-10-CM | POA: Diagnosis not present

## 2021-05-05 DIAGNOSIS — Z9181 History of falling: Secondary | ICD-10-CM | POA: Diagnosis not present

## 2021-05-05 DIAGNOSIS — G8929 Other chronic pain: Secondary | ICD-10-CM | POA: Diagnosis not present

## 2021-05-05 DIAGNOSIS — K219 Gastro-esophageal reflux disease without esophagitis: Secondary | ICD-10-CM | POA: Diagnosis not present

## 2021-05-05 DIAGNOSIS — Z7982 Long term (current) use of aspirin: Secondary | ICD-10-CM | POA: Diagnosis not present

## 2021-05-05 DIAGNOSIS — Z7984 Long term (current) use of oral hypoglycemic drugs: Secondary | ICD-10-CM | POA: Diagnosis not present

## 2021-09-20 DIAGNOSIS — F03918 Unspecified dementia, unspecified severity, with other behavioral disturbance: Secondary | ICD-10-CM | POA: Diagnosis not present

## 2021-10-21 DIAGNOSIS — F4321 Adjustment disorder with depressed mood: Secondary | ICD-10-CM | POA: Diagnosis not present

## 2021-10-21 DIAGNOSIS — G8929 Other chronic pain: Secondary | ICD-10-CM | POA: Diagnosis not present

## 2021-10-21 DIAGNOSIS — E785 Hyperlipidemia, unspecified: Secondary | ICD-10-CM | POA: Diagnosis not present

## 2021-10-21 DIAGNOSIS — M25512 Pain in left shoulder: Secondary | ICD-10-CM | POA: Diagnosis not present

## 2021-10-21 DIAGNOSIS — E1169 Type 2 diabetes mellitus with other specified complication: Secondary | ICD-10-CM | POA: Diagnosis not present

## 2021-10-21 DIAGNOSIS — Z79899 Other long term (current) drug therapy: Secondary | ICD-10-CM | POA: Diagnosis not present

## 2021-11-10 DIAGNOSIS — E785 Hyperlipidemia, unspecified: Secondary | ICD-10-CM | POA: Diagnosis not present

## 2021-11-10 DIAGNOSIS — Z0000 Encounter for general adult medical examination without abnormal findings: Secondary | ICD-10-CM | POA: Diagnosis not present

## 2021-11-10 DIAGNOSIS — Z66 Do not resuscitate: Secondary | ICD-10-CM | POA: Diagnosis not present

## 2021-11-10 DIAGNOSIS — N289 Disorder of kidney and ureter, unspecified: Secondary | ICD-10-CM | POA: Diagnosis not present

## 2021-11-10 DIAGNOSIS — E1169 Type 2 diabetes mellitus with other specified complication: Secondary | ICD-10-CM | POA: Diagnosis not present

## 2021-12-19 DIAGNOSIS — F03918 Unspecified dementia, unspecified severity, with other behavioral disturbance: Secondary | ICD-10-CM | POA: Diagnosis not present

## 2022-01-03 DIAGNOSIS — F03918 Unspecified dementia, unspecified severity, with other behavioral disturbance: Secondary | ICD-10-CM | POA: Diagnosis not present

## 2022-04-22 DIAGNOSIS — R410 Disorientation, unspecified: Secondary | ICD-10-CM | POA: Diagnosis not present

## 2022-04-22 DIAGNOSIS — I959 Hypotension, unspecified: Secondary | ICD-10-CM | POA: Diagnosis not present

## 2022-04-24 DIAGNOSIS — Z111 Encounter for screening for respiratory tuberculosis: Secondary | ICD-10-CM | POA: Diagnosis not present

## 2022-05-03 ENCOUNTER — Telehealth: Payer: Self-pay | Admitting: Student

## 2022-05-03 NOTE — Telephone Encounter (Signed)
Spoke with patient's son Legrand Como, regarding the Palliative referral and about scheduling the Consult.  Son stated that the patient and her husband Gwyndolyn Saxon (who is a current Palliative patient) will be moving to [facility] on 05/09/22 and he wanted to wait until they get moved in and then have the Palliative NP come out and see them there.  Told son that I would notify our Referral Intake staff of this so they can f/u with the facility to get the order to see them there and he was in agreement with this.

## 2022-05-11 DIAGNOSIS — Z66 Do not resuscitate: Secondary | ICD-10-CM | POA: Diagnosis not present

## 2022-05-11 DIAGNOSIS — E782 Mixed hyperlipidemia: Secondary | ICD-10-CM | POA: Diagnosis not present

## 2022-05-11 DIAGNOSIS — R269 Unspecified abnormalities of gait and mobility: Secondary | ICD-10-CM | POA: Diagnosis not present

## 2022-05-11 DIAGNOSIS — F02C11 Dementia in other diseases classified elsewhere, severe, with agitation: Secondary | ICD-10-CM | POA: Diagnosis not present

## 2022-05-11 DIAGNOSIS — Z9183 Wandering in diseases classified elsewhere: Secondary | ICD-10-CM | POA: Diagnosis not present

## 2022-05-11 DIAGNOSIS — G309 Alzheimer's disease, unspecified: Secondary | ICD-10-CM | POA: Diagnosis not present

## 2022-05-11 DIAGNOSIS — E119 Type 2 diabetes mellitus without complications: Secondary | ICD-10-CM | POA: Diagnosis not present

## 2022-05-11 DIAGNOSIS — F331 Major depressive disorder, recurrent, moderate: Secondary | ICD-10-CM | POA: Diagnosis not present

## 2022-05-11 DIAGNOSIS — M1990 Unspecified osteoarthritis, unspecified site: Secondary | ICD-10-CM | POA: Diagnosis not present

## 2022-05-15 DIAGNOSIS — F02C11 Dementia in other diseases classified elsewhere, severe, with agitation: Secondary | ICD-10-CM | POA: Diagnosis not present

## 2022-05-15 DIAGNOSIS — F331 Major depressive disorder, recurrent, moderate: Secondary | ICD-10-CM | POA: Diagnosis not present

## 2022-05-15 DIAGNOSIS — Z9183 Wandering in diseases classified elsewhere: Secondary | ICD-10-CM | POA: Diagnosis not present

## 2022-05-15 DIAGNOSIS — R269 Unspecified abnormalities of gait and mobility: Secondary | ICD-10-CM | POA: Diagnosis not present

## 2022-05-15 DIAGNOSIS — Z7984 Long term (current) use of oral hypoglycemic drugs: Secondary | ICD-10-CM | POA: Diagnosis not present

## 2022-05-15 DIAGNOSIS — G309 Alzheimer's disease, unspecified: Secondary | ICD-10-CM | POA: Diagnosis not present

## 2022-05-15 DIAGNOSIS — M1990 Unspecified osteoarthritis, unspecified site: Secondary | ICD-10-CM | POA: Diagnosis not present

## 2022-05-15 DIAGNOSIS — E782 Mixed hyperlipidemia: Secondary | ICD-10-CM | POA: Diagnosis not present

## 2022-05-15 DIAGNOSIS — N189 Chronic kidney disease, unspecified: Secondary | ICD-10-CM | POA: Diagnosis not present

## 2022-05-15 DIAGNOSIS — E1122 Type 2 diabetes mellitus with diabetic chronic kidney disease: Secondary | ICD-10-CM | POA: Diagnosis not present

## 2022-05-19 DIAGNOSIS — E1100 Type 2 diabetes mellitus with hyperosmolarity without nonketotic hyperglycemic-hyperosmolar coma (NKHHC): Secondary | ICD-10-CM | POA: Diagnosis not present

## 2022-05-23 DIAGNOSIS — R7309 Other abnormal glucose: Secondary | ICD-10-CM | POA: Diagnosis not present

## 2022-05-26 ENCOUNTER — Non-Acute Institutional Stay: Payer: PPO | Admitting: Student

## 2022-05-26 DIAGNOSIS — F419 Anxiety disorder, unspecified: Secondary | ICD-10-CM

## 2022-05-26 DIAGNOSIS — Z79899 Other long term (current) drug therapy: Secondary | ICD-10-CM | POA: Diagnosis not present

## 2022-05-26 DIAGNOSIS — Z515 Encounter for palliative care: Secondary | ICD-10-CM

## 2022-05-26 DIAGNOSIS — F32A Depression, unspecified: Secondary | ICD-10-CM | POA: Diagnosis not present

## 2022-05-26 DIAGNOSIS — F03B3 Unspecified dementia, moderate, with mood disturbance: Secondary | ICD-10-CM | POA: Diagnosis not present

## 2022-05-26 NOTE — Progress Notes (Unsigned)
[facility] Consult Note Telephone: [phone]  Fax: [phone]   Date of encounter: 05/26/22 4:49 PM PATIENT NAME: Sarah Velasquez [address] [place] [address]   [phone] (home)  DOB: 05-19-1946 MRN: [id number] PRIMARY CARE PROVIDER:    Dion Body, MD,  [place] [address] [phone]  REFERRING PROVIDER:   Virgie Velasquez, Velasquez  RESPONSIBLE PARTY:    Contact Information     Name Relation Home Work Mobile   [place] Son [phone]     Peri Jefferson Daughter   [phone]        I met face to face with patient in the facility. Palliative Care was asked to follow this patient by consultation request of  Sarah Velasquez to address advance care planning and complex medical decision making. This is the initial visit.                                     ASSESSMENT AND PLAN / RECOMMENDATIONS:   Advance Care Planning/Goals of Care: Goals include to maximize quality of life and symptom management. Patient/health care surrogate gave his/her permission to discuss. CODE STATUS: DNR  Education provided on Palliative Medicine. Will provide ongoing support, symptom management as needed.   Symptom Management/Plan:  Dementia-patient recently moved to AL on locked unit with her husband. She is having some difficulty adjusting, packing up belongings at times. Staff to continue assisting with adl's as needed, reorient and redirect as needed. Monitor for falls/safety. Patient to be followed by psychiatry; upcoming appointment. Recent medication changes; will defer medication changes to psychiatry. Continue Aricept 10 mg QHS. Will monitor for functional and cognitive declines.   Depression and anxiety-patient with some difficulty adjusting to move. Increased anxiety. Buspar recently adjusted. Continue clonazepam 0.5 mg each evening, escitalopram 10  mg daily, quetiapine 25 mg BID, Buspar 10 mg BID and 5 mg in the afternoons, Depakote 250 mg BID. Patient to be followed by psychiatry.   Follow up Palliative Care Visit: Palliative care will continue to follow for complex medical decision making, advance care planning, and clarification of goals. Return in 6-8 weeks or prn.  This visit was coded based on medical decision making (MDM).  PPS: 50%  HOSPICE ELIGIBILITY/DIAGNOSIS: TBD  Chief Complaint: Palliative Medicine initial consult.   HISTORY OF PRESENT ILLNESS:  Sarah Velasquez is a 76 y.o. year old female  with dementia, MDD, anxiety, T2DM, hyperlipidemia, OA,  DDD, GERD, Vitamin D deficiency.  Patient recently moved to [facility] and dementia with her husband. She has had some difficulty adjusting; she talks about returning home to take care of her parents. She has occassionally packed up her belongings. Her appetite is fair overall; patient states "I'm just not a big eater." She denies pain, shortness of breath, nausea, constipation. She is sleeping well.   Patient received in her room. She is able to answer questions; her speech is clear. She states it is 2017, month is August and she is in [place]. She talks about needing to go home to take care of her parents and is unsure if they are even alive. She is cooperative with assessment.   History obtained from review of EMR, discussion with primary team, and interview with family, facility staff/caregiver and/or Sarah Velasquez.  I reviewed available labs, medications, imaging, studies and related documents from the EMR.  Records reviewed and summarized above.   ROS  A 10-Point ROS is negative, except for the pertinent positives/negatives detailed per the HPI.   Physical Exam: Weight: 164.5 pounds Pulse 76, resp 16, b/p 110/68, sats 98% on room air Constitutional: NAD General: frail appearing EYES: anicteric sclera, lids intact, no discharge  ENMT: intact hearing,  oral mucous membranes moist, dentition intact CV: S1S2, RRR, no LE edema Pulmonary: LCTA, no increased work of breathing, no cough Abdomen: normo-active BS + 4 quadrants, soft and non tender GU: deferred MSK: moves all extremities, ambulatory, digits 3-5 missing on right hand Skin: warm and dry, no rashes or wounds on visible skin Neuro:  no generalized weakness, A & O to person Psych: mildly anxious, cooperative  Hem/lymph/immuno: no widespread bruising CURRENT PROBLEM LIST:  Patient Active Problem List   Diagnosis Date Noted   Hypotension 05/14/2020   Delirium with dementia 05/14/2020   Acute blood loss anemia 05/14/2020   Closed intertrochanteric fracture of hip, left, initial encounter ([place]) 05/12/2020   Fall at home, initial encounter 05/12/2020   Closed fracture dislocation of left shoulder 05/12/2020   Diabetes mellitus without complication ([place]) 16/55/3748   Depression 05/12/2020   Chronic bronchitis ([place]) 07/26/2015   Advanced care planning/counseling discussion 06/09/2014   Caregiver burden 12/04/2013   Medicare annual wellness visit, subsequent 09/21/2012   Degenerative disc disease, lumbar 06/29/2012   Osteopenia 12/04/2011   Vitamin D deficiency 10/12/2009   Mixed hyperlipidemia 07/26/2007   ALLERGIC RHINITIS 01/09/2007   FIBROCYSTIC BREAST DISEASE 01/09/2007   DISORDER, MENOPAUSAL NEC 01/09/2007   Diabetes type 2, controlled ([place]) 01/07/2007   GERD 01/07/2007   ANXIETY DISORDER, GENERALIZED 07/18/2006   PAST MEDICAL HISTORY:  Active Ambulatory Problems    Diagnosis Date Noted   Diabetes type 2, controlled ([place]) 01/07/2007   Vitamin D deficiency 10/12/2009   Mixed hyperlipidemia 07/26/2007   ANXIETY DISORDER, GENERALIZED 07/18/2006   ALLERGIC RHINITIS 01/09/2007   GERD 01/07/2007   FIBROCYSTIC BREAST DISEASE 01/09/2007   DISORDER, MENOPAUSAL NEC 01/09/2007   Osteopenia 12/04/2011   Degenerative disc disease, lumbar 06/29/2012   Medicare annual wellness  visit, subsequent 09/21/2012   Caregiver burden 12/04/2013   Advanced care planning/counseling discussion 06/09/2014   Chronic bronchitis ([place]) 07/26/2015   Closed intertrochanteric fracture of hip, left, initial encounter ([place]) 05/12/2020   Fall at home, initial encounter 05/12/2020   Closed fracture dislocation of left shoulder 05/12/2020   Diabetes mellitus without complication ([place]) 27/03/8674   Depression 05/12/2020   Hypotension 05/14/2020   Delirium with dementia 05/14/2020   Acute blood loss anemia 05/14/2020   Resolved Ambulatory Problems    Diagnosis Date Noted   LATERAL EPICONDYLITIS, LEFT 08/10/2010   Fungal dermatitis 02/01/2011   Vertigo 05/25/2011   Viral URI with cough 03/29/2012   Left hip pain 05/29/2012   Knee pain 05/29/2012   Ankle pain 05/29/2012   Right hip pain 06/11/2012   Epistaxis 02/05/2013   Acute bronchitis 06/09/2014   Cough 01/13/2015   Trigger finger, acquired 01/13/2015   Past Medical History:  Diagnosis Date   Anxiety    Chest pain 02/15-16/2009   Diabetes mellitus    Diverticulosis of colon 09/2003   Drug-induced constipation    Environmental allergies    GERD (gastroesophageal reflux disease)    H/O hiatal hernia    Hyperlipidemia 05/1995   PONV (postoperative nausea and vomiting)    Right lumbar radiculitis 2013   SOCIAL HX:  Social History   Tobacco Use   Smoking  status: Never   Smokeless tobacco: Never  Substance Use Topics   Alcohol use: Yes    Alcohol/week: 0.0 standard drinks of alcohol    Comment: rarely - wine   FAMILY HX:  Family History  Problem Relation Age of Onset   Heart disease Mother 67       MI   COPD Father        emphysema   Cancer Father        esophageal and brain cancer   Heart disease Father 3       CABG   Alcohol abuse Sister 74       etoh   Heart disease Brother 72       MI   Breast cancer Neg Hx       ALLERGIES:  Allergies  Allergen Reactions   Fish Allergy Anaphylaxis    Shellfish Allergy Anaphylaxis   Azithromycin     REACTION: rash   Meperidine Hcl     REACTION: nausea   Neurontin [Gabapentin] Other (See Comments)    Incoherent   Omeprazole     REACTION: rash ([facility] brand)     PERTINENT MEDICATIONS:  Outpatient Encounter Medications as of 05/26/2022  Medication Sig   acetaminophen (TYLENOL) 325 MG tablet Take 2 tablets (650 mg total) by mouth every 8 (eight) hours.   aspirin 81 MG tablet Take 81 mg by mouth daily.   atorvastatin (LIPITOR) 80 MG tablet Take 1 tablet (80 mg total) by mouth daily.   bisacodyl (DULCOLAX) 10 MG suppository Place 1 suppository (10 mg total) rectally daily as needed for moderate constipation.   busPIRone (BUSPAR) 10 MG tablet Take 10 mg by mouth 2 (two) times daily.   cetirizine (ZYRTEC ALLERGY) 10 MG tablet Take 10 mg by mouth daily.     cholecalciferol (VITAMIN D) 25 MCG tablet Take 1 tablet (1,000 Units total) by mouth daily.   docusate sodium (COLACE) 100 MG capsule Take 200 mg by mouth daily.    donepezil (ARICEPT) 10 MG tablet Take 10 mg by mouth at bedtime.   enoxaparin (LOVENOX) 40 MG/0.4ML injection Inject 0.4 mLs (40 mg total) into the skin daily for 14 days.   escitalopram (LEXAPRO) 10 MG tablet Take 10 mg by mouth daily.    ferrous [id number] C-folic acid (TRINSICON / FOLTRIN) capsule Take 1 capsule by mouth 2 (two) times daily after a meal.   glucose blood (ONE TOUCH ULTRA TEST) test strip 1 each by Other route daily. Use to check sugar daily Dx: E11.9 **ONE TOUCH ULTRA BLUE**   magnesium hydroxide (MILK OF MAGNESIA) 400 MG/5ML suspension Take 30 mLs by mouth daily as needed for mild constipation.   metFORMIN (GLUCOPHAGE) 500 MG tablet Take 500 mg by mouth 2 (two) times daily.   methocarbamol (ROBAXIN) 500 MG tablet Take 1 tablet (500 mg total) by mouth every 8 (eight) hours as needed for muscle spasms.   Multiple Vitamin (MULTIVITAMIN WITH MINERALS) TABS tablet Take 1 tablet by mouth daily.   ONETOUCH  DELICA LANCETS 29W MISC USE ONE LANCET ONCE DAILY AS DIRECTED   oxyCODONE-acetaminophen (PERCOCET/ROXICET) 5-325 MG tablet Take 1 tablet by mouth every 6 (six) hours as needed for moderate pain.   QUEtiapine (SEROQUEL) 25 MG tablet Take 25 mg by mouth at bedtime.   No facility-administered encounter medications on file as of 05/26/2022.   Thank you for the opportunity to participate in the care of Ms. Carreto.  The palliative care team will continue to follow. Please  call our office at [phone] if we can be of additional assistance.   Ezekiel Slocumb, NP   COVID-19 PATIENT SCREENING TOOL Asked and negative response unless otherwise noted:  Have you had symptoms of covid, tested positive or been in contact with someone with symptoms/positive test in the past 5-10 days? No

## 2022-05-29 DIAGNOSIS — F02B3 Dementia in other diseases classified elsewhere, moderate, with mood disturbance: Secondary | ICD-10-CM | POA: Diagnosis not present

## 2022-05-29 DIAGNOSIS — F4322 Adjustment disorder with anxiety: Secondary | ICD-10-CM | POA: Diagnosis not present

## 2022-05-29 DIAGNOSIS — F331 Major depressive disorder, recurrent, moderate: Secondary | ICD-10-CM | POA: Diagnosis not present

## 2022-05-29 DIAGNOSIS — G301 Alzheimer's disease with late onset: Secondary | ICD-10-CM | POA: Diagnosis not present

## 2022-05-31 DIAGNOSIS — R300 Dysuria: Secondary | ICD-10-CM | POA: Diagnosis not present

## 2022-05-31 DIAGNOSIS — G301 Alzheimer's disease with late onset: Secondary | ICD-10-CM | POA: Diagnosis not present

## 2022-05-31 DIAGNOSIS — E1122 Type 2 diabetes mellitus with diabetic chronic kidney disease: Secondary | ICD-10-CM | POA: Diagnosis not present

## 2022-05-31 DIAGNOSIS — E1159 Type 2 diabetes mellitus with other circulatory complications: Secondary | ICD-10-CM | POA: Diagnosis not present

## 2022-05-31 DIAGNOSIS — B351 Tinea unguium: Secondary | ICD-10-CM | POA: Diagnosis not present

## 2022-06-06 DIAGNOSIS — E1122 Type 2 diabetes mellitus with diabetic chronic kidney disease: Secondary | ICD-10-CM | POA: Diagnosis not present

## 2022-06-06 DIAGNOSIS — R269 Unspecified abnormalities of gait and mobility: Secondary | ICD-10-CM | POA: Diagnosis not present

## 2022-06-06 DIAGNOSIS — M1990 Unspecified osteoarthritis, unspecified site: Secondary | ICD-10-CM | POA: Diagnosis not present

## 2022-06-06 DIAGNOSIS — Z9183 Wandering in diseases classified elsewhere: Secondary | ICD-10-CM | POA: Diagnosis not present

## 2022-06-06 DIAGNOSIS — N189 Chronic kidney disease, unspecified: Secondary | ICD-10-CM | POA: Diagnosis not present

## 2022-06-06 DIAGNOSIS — E782 Mixed hyperlipidemia: Secondary | ICD-10-CM | POA: Diagnosis not present

## 2022-06-06 DIAGNOSIS — Z7984 Long term (current) use of oral hypoglycemic drugs: Secondary | ICD-10-CM | POA: Diagnosis not present

## 2022-06-06 DIAGNOSIS — F331 Major depressive disorder, recurrent, moderate: Secondary | ICD-10-CM | POA: Diagnosis not present

## 2022-06-06 DIAGNOSIS — F02C11 Dementia in other diseases classified elsewhere, severe, with agitation: Secondary | ICD-10-CM | POA: Diagnosis not present

## 2022-06-06 DIAGNOSIS — G309 Alzheimer's disease, unspecified: Secondary | ICD-10-CM | POA: Diagnosis not present

## 2022-06-08 DIAGNOSIS — N182 Chronic kidney disease, stage 2 (mild): Secondary | ICD-10-CM | POA: Diagnosis not present

## 2022-06-08 DIAGNOSIS — Z6826 Body mass index (BMI) 26.0-26.9, adult: Secondary | ICD-10-CM | POA: Diagnosis not present

## 2022-06-08 DIAGNOSIS — M1990 Unspecified osteoarthritis, unspecified site: Secondary | ICD-10-CM | POA: Diagnosis not present

## 2022-06-08 DIAGNOSIS — F02B3 Dementia in other diseases classified elsewhere, moderate, with mood disturbance: Secondary | ICD-10-CM | POA: Diagnosis not present

## 2022-06-08 DIAGNOSIS — E538 Deficiency of other specified B group vitamins: Secondary | ICD-10-CM | POA: Diagnosis not present

## 2022-06-08 DIAGNOSIS — E1122 Type 2 diabetes mellitus with diabetic chronic kidney disease: Secondary | ICD-10-CM | POA: Diagnosis not present

## 2022-06-08 DIAGNOSIS — G301 Alzheimer's disease with late onset: Secondary | ICD-10-CM | POA: Diagnosis not present

## 2022-06-09 DIAGNOSIS — Z79899 Other long term (current) drug therapy: Secondary | ICD-10-CM | POA: Diagnosis not present

## 2022-06-16 DIAGNOSIS — Z9183 Wandering in diseases classified elsewhere: Secondary | ICD-10-CM | POA: Diagnosis not present

## 2022-06-16 DIAGNOSIS — F02C11 Dementia in other diseases classified elsewhere, severe, with agitation: Secondary | ICD-10-CM | POA: Diagnosis not present

## 2022-06-16 DIAGNOSIS — G309 Alzheimer's disease, unspecified: Secondary | ICD-10-CM | POA: Diagnosis not present

## 2022-06-23 DIAGNOSIS — E1122 Type 2 diabetes mellitus with diabetic chronic kidney disease: Secondary | ICD-10-CM | POA: Diagnosis not present

## 2022-06-23 DIAGNOSIS — G301 Alzheimer's disease with late onset: Secondary | ICD-10-CM | POA: Diagnosis not present

## 2022-06-30 DIAGNOSIS — E1122 Type 2 diabetes mellitus with diabetic chronic kidney disease: Secondary | ICD-10-CM | POA: Diagnosis not present

## 2022-06-30 DIAGNOSIS — E538 Deficiency of other specified B group vitamins: Secondary | ICD-10-CM | POA: Diagnosis not present

## 2022-06-30 DIAGNOSIS — M150 Primary generalized (osteo)arthritis: Secondary | ICD-10-CM | POA: Diagnosis not present

## 2022-06-30 DIAGNOSIS — U071 COVID-19: Secondary | ICD-10-CM | POA: Diagnosis not present

## 2022-06-30 DIAGNOSIS — N182 Chronic kidney disease, stage 2 (mild): Secondary | ICD-10-CM | POA: Diagnosis not present

## 2022-06-30 DIAGNOSIS — E7849 Other hyperlipidemia: Secondary | ICD-10-CM | POA: Diagnosis not present

## 2022-07-06 DIAGNOSIS — M1990 Unspecified osteoarthritis, unspecified site: Secondary | ICD-10-CM | POA: Diagnosis not present

## 2022-07-06 DIAGNOSIS — F02C11 Dementia in other diseases classified elsewhere, severe, with agitation: Secondary | ICD-10-CM | POA: Diagnosis not present

## 2022-07-06 DIAGNOSIS — G309 Alzheimer's disease, unspecified: Secondary | ICD-10-CM | POA: Diagnosis not present

## 2022-07-13 ENCOUNTER — Non-Acute Institutional Stay: Payer: PPO | Admitting: Student

## 2022-07-13 DIAGNOSIS — F419 Anxiety disorder, unspecified: Secondary | ICD-10-CM

## 2022-07-13 DIAGNOSIS — F32A Depression, unspecified: Secondary | ICD-10-CM | POA: Diagnosis not present

## 2022-07-13 DIAGNOSIS — Z515 Encounter for palliative care: Secondary | ICD-10-CM | POA: Diagnosis not present

## 2022-07-13 DIAGNOSIS — F03B3 Unspecified dementia, moderate, with mood disturbance: Secondary | ICD-10-CM

## 2022-07-13 NOTE — Progress Notes (Signed)
[facility] Consult Note Telephone: [phone]  Fax: [phone]    Date of encounter: 07/13/22 3:00PM PATIENT NAME: Sarah Velasquez [address] [place] [address]   [phone] (home)  DOB: 22-Sep-1945 MRN: [id number] PRIMARY CARE PROVIDER:    Virgie Dad, NP[facility]    REFERRING PROVIDER:   Virgie Dad, NP-[facility]    RESPONSIBLE PARTY:    Contact Information     Name Relation Home Work Mobile   [place] Son [phone]     Peri Jefferson Daughter   [phone]        I met face to face with patient in the facility. Palliative Care was asked to follow this patient by consultation request of  Virgie Dad, NP-[facility]  to address advance care planning and complex medical decision making. This is a follow up visit.                                   ASSESSMENT AND PLAN / RECOMMENDATIONS:   Advance Care Planning/Goals of Care: Goals include to maximize quality of life and symptom management. Patient/health care surrogate gave his/her permission to discuss. CODE STATUS: DNR  Palliative Medicine will continue to provide supportive care, symptom management as needed.   Symptom Management/Plan:  Dementia with depression and anxiety-patient resides on locked memory AL unit. She is wandering less, seeking to go home per staff. She is participating in some activities. Staff to reorient/redirect as needed. Monitor for falls/safety. She is followed by psychiatry; escitalopram discontinued; started on Prozac. Continue Aricept, clonazepam, Depakote, Seroquel as directed. Monitor for functional and cognitive declines.  Follow up Palliative Care Visit: Palliative care will continue to follow for complex medical decision making, advance care planning, and clarification of goals. Return in 4-6 weeks or prn.   This visit was coded based on medical decision making (MDM).  PPS: 50%  HOSPICE ELIGIBILITY/DIAGNOSIS:  TBD  Chief Complaint: Palliative Medicine follow up visit.   HISTORY OF PRESENT ILLNESS:  Sarah Velasquez is a 76 y.o. year old female  with  dementia, MDD, anxiety, T2DM, hyperlipidemia, OA,  DDD, GERD, Vitamin D deficiency.    Patient resides at [facility] and dementia with her husband. She is seeking to leave facility less. She has not been packing up her belongings as she had been. She is attending some activities. She endorses back pain today. She denies shortness of breath, nausea, constipation. Her appetite has been good. Patient observed in an activity; she states she enjoyed activity. Upon returning to her room, she reports back pain as she states she had been walking around a lot today. She is calm, cooperative throughout visit.she does not speak about wanting to leave any during visit today. She was treated for Covid infection in October.   History obtained from review of EMR, discussion with primary team, and interview with family, facility staff/caregiver and/or Ms. Peden.  I reviewed available labs, medications, imaging, studies and related documents from the EMR.  Records reviewed and summarized above.   ROS  A 10-Point ROS is negative, except for the pertinent positives/negatives detailed per the HPI.    Physical Exam: Pulse 68, resp 16, sats 96% on room air Constitutional: NAD General: frail appearing EYES: anicteric sclera, lids intact, no discharge  ENMT: intact hearing, oral mucous membranes moist, dentition intact CV: S1S2, RRR, no LE edema Pulmonary: LCTA, no increased work of breathing, no cough,  room air Abdomen: normo-active BS + 4 quadrants, soft and non tender, no ascites GU: deferred MSK: moves all extremities, ambulatory without assistive device; digits 3-5 missing on right hand Skin: warm and dry, no rashes or wounds on visible skin Neuro:  no generalized weakness,  + cognitive impairment Psych: non-anxious affect, A and O to  person Hem/lymph/immuno: no widespread bruising   Thank you for the opportunity to participate in the care of Ms. Adcox. Please call our office at [phone] if we can be of additional assistance.   Ezekiel Slocumb, NP   COVID-19 PATIENT SCREENING TOOL Asked and negative response unless otherwise noted:   Have you had symptoms of covid, tested positive or been in contact with someone with symptoms/positive test in the past 5-10 days? No

## 2022-07-14 ENCOUNTER — Telehealth: Payer: Self-pay | Admitting: Student

## 2022-07-14 NOTE — Telephone Encounter (Signed)
Palliative NP spoke with son to provide an update on yesterday's Palliative visit. Son asked about recent lab work; PCP notified with request and awaiting response.

## 2022-07-20 DIAGNOSIS — G309 Alzheimer's disease, unspecified: Secondary | ICD-10-CM | POA: Diagnosis not present

## 2022-07-20 DIAGNOSIS — J029 Acute pharyngitis, unspecified: Secondary | ICD-10-CM | POA: Diagnosis not present

## 2022-07-20 DIAGNOSIS — M542 Cervicalgia: Secondary | ICD-10-CM | POA: Diagnosis not present

## 2022-07-20 DIAGNOSIS — M25551 Pain in right hip: Secondary | ICD-10-CM | POA: Diagnosis not present

## 2022-07-20 DIAGNOSIS — R4182 Altered mental status, unspecified: Secondary | ICD-10-CM | POA: Diagnosis not present

## 2022-07-20 DIAGNOSIS — F02C11 Dementia in other diseases classified elsewhere, severe, with agitation: Secondary | ICD-10-CM | POA: Diagnosis not present

## 2022-07-24 DIAGNOSIS — G309 Alzheimer's disease, unspecified: Secondary | ICD-10-CM | POA: Diagnosis not present

## 2022-07-24 DIAGNOSIS — F331 Major depressive disorder, recurrent, moderate: Secondary | ICD-10-CM | POA: Diagnosis not present

## 2022-07-24 DIAGNOSIS — F02C11 Dementia in other diseases classified elsewhere, severe, with agitation: Secondary | ICD-10-CM | POA: Diagnosis not present

## 2022-07-24 DIAGNOSIS — F4322 Adjustment disorder with anxiety: Secondary | ICD-10-CM | POA: Diagnosis not present

## 2022-07-24 DIAGNOSIS — R300 Dysuria: Secondary | ICD-10-CM | POA: Diagnosis not present

## 2022-07-24 DIAGNOSIS — Z9183 Wandering in diseases classified elsewhere: Secondary | ICD-10-CM | POA: Diagnosis not present

## 2022-07-28 DIAGNOSIS — R4182 Altered mental status, unspecified: Secondary | ICD-10-CM | POA: Diagnosis not present

## 2022-07-31 DIAGNOSIS — E1159 Type 2 diabetes mellitus with other circulatory complications: Secondary | ICD-10-CM | POA: Diagnosis not present

## 2022-08-03 DIAGNOSIS — E1122 Type 2 diabetes mellitus with diabetic chronic kidney disease: Secondary | ICD-10-CM | POA: Diagnosis not present

## 2022-08-03 DIAGNOSIS — R4182 Altered mental status, unspecified: Secondary | ICD-10-CM | POA: Diagnosis not present

## 2022-08-03 DIAGNOSIS — G309 Alzheimer's disease, unspecified: Secondary | ICD-10-CM | POA: Diagnosis not present

## 2022-08-03 DIAGNOSIS — F02C11 Dementia in other diseases classified elsewhere, severe, with agitation: Secondary | ICD-10-CM | POA: Diagnosis not present

## 2022-08-03 DIAGNOSIS — J069 Acute upper respiratory infection, unspecified: Secondary | ICD-10-CM | POA: Diagnosis not present

## 2022-08-03 DIAGNOSIS — E7220 Disorder of urea cycle metabolism, unspecified: Secondary | ICD-10-CM | POA: Diagnosis not present

## 2022-08-10 DIAGNOSIS — R634 Abnormal weight loss: Secondary | ICD-10-CM | POA: Diagnosis not present

## 2022-08-10 DIAGNOSIS — E7220 Disorder of urea cycle metabolism, unspecified: Secondary | ICD-10-CM | POA: Diagnosis not present

## 2022-08-10 DIAGNOSIS — F02C11 Dementia in other diseases classified elsewhere, severe, with agitation: Secondary | ICD-10-CM | POA: Diagnosis not present

## 2022-08-10 DIAGNOSIS — R197 Diarrhea, unspecified: Secondary | ICD-10-CM | POA: Diagnosis not present

## 2022-08-10 DIAGNOSIS — G309 Alzheimer's disease, unspecified: Secondary | ICD-10-CM | POA: Diagnosis not present

## 2022-08-16 ENCOUNTER — Non-Acute Institutional Stay: Payer: PPO | Admitting: Hospice

## 2022-08-16 DIAGNOSIS — Z515 Encounter for palliative care: Secondary | ICD-10-CM

## 2022-08-16 DIAGNOSIS — F03911 Unspecified dementia, unspecified severity, with agitation: Secondary | ICD-10-CM | POA: Diagnosis not present

## 2022-08-16 DIAGNOSIS — F03918 Unspecified dementia, unspecified severity, with other behavioral disturbance: Secondary | ICD-10-CM

## 2022-08-16 DIAGNOSIS — F419 Anxiety disorder, unspecified: Secondary | ICD-10-CM | POA: Diagnosis not present

## 2022-08-16 DIAGNOSIS — F32A Depression, unspecified: Secondary | ICD-10-CM

## 2022-08-16 NOTE — Progress Notes (Signed)
    [facility] Consult Note Telephone: [phone]  Fax: [phone]    Date of encounter: 08/16/22 3:00PM PATIENT NAME: Sarah Velasquez [address] [place] [address]   [phone] (home)  DOB: October 26, 1945 MRN: [id number] PRIMARY CARE PROVIDER:    Virgie Dad, NP-[facility]    REFERRING PROVIDER:   Virgie Dad, NP-[facility]    RESPONSIBLE PARTY:    Contact Information     Name Relation Home Work Mobile   [place] Son [phone]     Peri [place] Daughter   [phone]        I met face to face with patient in the facility. Palliative Care was asked to follow this patient by consultation request of  Virgie Dad, NP-[facility]  to address advance care planning and complex medical decision making. This is a follow up visit.                                   ASSESSMENT AND PLAN / RECOMMENDATIONS:   Advance Care Planning/Goals of Care: Goals include to maximize quality of life and symptom management.  CODE STATUS: DNR  Symptom Management/Plan:  Dementia: Ongoing memory loss, confusion, impoverished thoughts, occasional agitation, limited language in line with dementia disease trajectory. FAST 6D.  Continue ongoing supportive care.  Continue to Monitor for functional and cognitive declines.  Agitation: Managed with Deparkote.  De-escalation techniques.  Routine CBC CMP Depakote level.  Depression and anxiety: Managed with Buspar, Prozac and Lorazapam.   Follow up Palliative Care Visit: Palliative care will continue to follow for complex medical decision making, advance care planning, and clarification of goals. Return in 4-6 weeks or prn.  HOSPICE ELIGIBILITY/DIAGNOSIS: TBD  Chief Complaint: Palliative Medicine follow up visit.   HISTORY OF PRESENT ILLNESS:  Sarah Velasquez is a 76 y.o. year old female  with multiple morbidities requiring close monitoring, with high risk of complications and mortality: Advanced  dementia, MDD, anxiety, T2DM, hyperlipidemia, OA,  DDD, GERD, Vitamin D deficiency.   History obtained from review of EMR, discussion with primary team, and interview with family, facility staff/caregiver and/or Ms. Rolfson.  Patient in no acute distress, denied pain/discomfort. I reviewed available labs, medications, imaging, studies and related documents from the EMR.  All 10 point systems reviewed and are negative except as documented in history of present illness above Records reviewed and summarized above.   I spent 45 minutes providing this consultation; this includes time spent with patient/family, chart review and documentation. More than 50% of the time in this consultation was spent on counseling and coordinating communication   Thank you for the opportunity to participate in the care of Ms. Halbert. Please call our office at [phone] if we can be of additional assistance.   Teodoro Spray, NP

## 2022-08-17 DIAGNOSIS — F331 Major depressive disorder, recurrent, moderate: Secondary | ICD-10-CM | POA: Diagnosis not present

## 2022-08-17 DIAGNOSIS — R197 Diarrhea, unspecified: Secondary | ICD-10-CM | POA: Diagnosis not present

## 2022-08-17 DIAGNOSIS — E7220 Disorder of urea cycle metabolism, unspecified: Secondary | ICD-10-CM | POA: Diagnosis not present

## 2022-08-25 DIAGNOSIS — E7220 Disorder of urea cycle metabolism, unspecified: Secondary | ICD-10-CM | POA: Diagnosis not present

## 2022-08-29 DIAGNOSIS — G309 Alzheimer's disease, unspecified: Secondary | ICD-10-CM | POA: Diagnosis not present

## 2022-08-29 DIAGNOSIS — F331 Major depressive disorder, recurrent, moderate: Secondary | ICD-10-CM | POA: Diagnosis not present

## 2022-08-29 DIAGNOSIS — F02C11 Dementia in other diseases classified elsewhere, severe, with agitation: Secondary | ICD-10-CM | POA: Diagnosis not present

## 2022-08-29 DIAGNOSIS — E782 Mixed hyperlipidemia: Secondary | ICD-10-CM | POA: Diagnosis not present

## 2022-08-29 DIAGNOSIS — N182 Chronic kidney disease, stage 2 (mild): Secondary | ICD-10-CM | POA: Diagnosis not present

## 2022-08-29 DIAGNOSIS — E7220 Disorder of urea cycle metabolism, unspecified: Secondary | ICD-10-CM | POA: Diagnosis not present

## 2022-08-31 DIAGNOSIS — E7220 Disorder of urea cycle metabolism, unspecified: Secondary | ICD-10-CM | POA: Diagnosis not present

## 2022-08-31 DIAGNOSIS — E1122 Type 2 diabetes mellitus with diabetic chronic kidney disease: Secondary | ICD-10-CM | POA: Diagnosis not present

## 2022-09-01 DIAGNOSIS — F331 Major depressive disorder, recurrent, moderate: Secondary | ICD-10-CM | POA: Diagnosis not present

## 2022-09-01 DIAGNOSIS — G309 Alzheimer's disease, unspecified: Secondary | ICD-10-CM | POA: Diagnosis not present

## 2022-09-01 DIAGNOSIS — F02C11 Dementia in other diseases classified elsewhere, severe, with agitation: Secondary | ICD-10-CM | POA: Diagnosis not present

## 2022-09-13 ENCOUNTER — Non-Acute Institutional Stay: Payer: Medicare Other | Admitting: Hospice

## 2022-09-13 DIAGNOSIS — F03918 Unspecified dementia, unspecified severity, with other behavioral disturbance: Secondary | ICD-10-CM

## 2022-09-13 DIAGNOSIS — F419 Anxiety disorder, unspecified: Secondary | ICD-10-CM

## 2022-09-13 DIAGNOSIS — Z515 Encounter for palliative care: Secondary | ICD-10-CM

## 2022-09-13 DIAGNOSIS — F03911 Unspecified dementia, unspecified severity, with agitation: Secondary | ICD-10-CM

## 2022-09-13 NOTE — Progress Notes (Signed)
    [facility] Consult Note Telephone: [phone]  Fax: [phone]    Date of encounter: 09/13/22 3:00PM PATIENT NAME: Sarah Velasquez [address] [place] [address]   [phone] (home)  DOB: Oct 18, 1945 MRN: [id number] PRIMARY CARE PROVIDER:    Virgie Dad, NP-[facility]    REFERRING PROVIDER:   Virgie Dad, NP-[facility]    RESPONSIBLE PARTY:    Contact Information     Name Relation Home Work Mobile   [place] Son [phone]     Peri Jefferson Daughter   [phone]        I met face to face with patient in the facility. Palliative Care was asked to follow this patient by consultation request of  Virgie Dad, NP-[facility]  to address advance care planning and complex medical decision making. This is a follow up visit.                                   ASSESSMENT AND PLAN / RECOMMENDATIONS:   Advance Care Planning/Goals of Care: Goals include to maximize quality of life and symptom management.  CODE STATUS: DNR  Symptom Management/Plan:  Dementia: Ongoing memory loss, confusion, impoverished thoughts, occasional agitation at baseline. FAST 6D.  FLACC 0. Ambulatory , high fall risk. Fall precautions. Continue ongoing supportive care.    Agitation: Continue Deparkote.  De-escalation techniques of redirection, calm approach.  Routine CBC CMP Depakote level.  Depression and anxiety: Psych consult as planned/needed.  Continue Buspar, Prozac and Lorazapam.   Follow up Palliative Care Visit: Palliative care will continue to follow for complex medical decision making, advance care planning, and clarification of goals. Return in 4-6 weeks or prn.  HOSPICE ELIGIBILITY/DIAGNOSIS: TBD  Chief Complaint: Palliative Medicine follow up visit.   HISTORY OF PRESENT ILLNESS:  Sarah Velasquez is a 77 y.o. year old female  with multiple morbidities requiring close monitoring, with high risk of complications and mortality:  Advanced dementia, MDD, anxiety, T2DM, hyperlipidemia, OA,  DDD, GERD, Vitamin D deficiency.   History obtained from review of EMR, discussion with primary team, and interview with family, facility staff/caregiver and/or Sarah Velasquez.  Patient in no acute distress, denied pain/discomfort. I reviewed available labs, medications, imaging, studies and related documents from the EMR.  All 10 point systems reviewed and are negative except as documented in history of present illness above Records reviewed and summarized above.   I spent 40 minutes providing this consultation; this includes time spent with patient/family, chart review and documentation. More than 50% of the time in this consultation was spent on counseling and coordinating communication   Thank you for the opportunity to participate in the care of Sarah Velasquez. Please call our office at [phone] if we can be of additional assistance.   Teodoro Spray, NP

## 2022-10-13 ENCOUNTER — Non-Acute Institutional Stay: Payer: PPO | Admitting: Hospice

## 2022-10-13 DIAGNOSIS — F03911 Unspecified dementia, unspecified severity, with agitation: Secondary | ICD-10-CM

## 2022-10-13 DIAGNOSIS — F03918 Unspecified dementia, unspecified severity, with other behavioral disturbance: Secondary | ICD-10-CM

## 2022-10-13 DIAGNOSIS — F419 Anxiety disorder, unspecified: Secondary | ICD-10-CM

## 2022-10-13 DIAGNOSIS — Z515 Encounter for palliative care: Secondary | ICD-10-CM

## 2022-10-13 NOTE — Progress Notes (Addendum)
    [facility] Consult Note Telephone: [phone]  Fax: [phone]    Date of encounter: 10/13/22 3:00PM PATIENT NAME: Sarah Velasquez [address] [place] [address]   [phone] (home)  DOB: [birth year] MRN: [id number] PRIMARY CARE PROVIDER:    Virgie Dad, NP-[facility]    REFERRING PROVIDER:   Virgie Dad, NP-[facility]    RESPONSIBLE PARTY:    Contact Information     Name Relation Home Work Mobile   [place] Son [phone]     Peri Jefferson Daughter   [phone]        I met face to face with patient in the facility. Palliative Care was asked to follow this patient by consultation request of  Virgie Dad, NP-[facility]  to address advance care planning and complex medical decision making. This is a follow up visit. NP called Sarah Velasquez and updated him on visit.                                   ASSESSMENT AND PLAN / RECOMMENDATIONS:   Advance Care Planning/Goals of Care: Goals include to maximize quality of life and symptom management.   Sarah Velasquez reports family is interested in hospice service in the future when patient qualifies for it.  CODE STATUS: DNR  Symptom Management/Plan:  Dementia: Ongoing memory loss, confusion, impoverished thoughts, occasional agitation.  Nursing reports more confusion recently.  Urinalysis with reflex to culture ordered.  Treat with antibiotics if indicated.  Continue ongoing supportive care.  FAST 6D.  FLACC 0. Ambulatory , high fall risk. Fall precautions.   Agitation: Managed with Deparkote.  De-escalation techniques of redirection, calm approach.  Routine CBC CMP Depakote level.  Depression and anxiety: At baseline.  Psych consult as planned/needed.  Continue Buspar, Prozac and Lorazapam.   Follow up Palliative Care Visit: Palliative care will continue to follow for complex medical decision making, advance care planning, and clarification of goals. Return in 4-6 weeks or  prn.  HOSPICE ELIGIBILITY/DIAGNOSIS: TBD  Chief Complaint: Palliative Medicine follow up visit.   HISTORY OF PRESENT ILLNESS:  Sarah Velasquez is a 77 y.o. year old female  with multiple morbidities requiring close monitoring, with high risk of complications and mortality: Advanced dementia, MDD, anxiety, T2DM, hyperlipidemia, OA,  DDD, GERD, Vitamin D deficiency.  Patient is cooperative during visit, denied pain/discomfort, in no acute distress. History obtained from review of EMR, discussion with primary team, and interview with family, facility staff/caregiver and/or Sarah Velasquez.  Patient in no acute distress, denied pain/discomfort. I reviewed available labs, medications, imaging, studies and related documents from the EMR.  All 10 point systems reviewed and are negative except as documented in history of present illness above Records reviewed and summarized above.   I spent 40 minutes providing this consultation; this includes time spent with patient/family, chart review and documentation. More than 50% of the time in this consultation was spent on counseling and coordinating communication   Thank you for the opportunity to participate in the care of Sarah Velasquez. Please call our office at [phone] if we can be of additional assistance.   Teodoro Spray, NP

## 2022-11-20 ENCOUNTER — Non-Acute Institutional Stay: Payer: Medicare Other | Admitting: Hospice

## 2022-11-20 DIAGNOSIS — F03918 Unspecified dementia, unspecified severity, with other behavioral disturbance: Secondary | ICD-10-CM

## 2022-11-20 DIAGNOSIS — F419 Anxiety disorder, unspecified: Secondary | ICD-10-CM

## 2022-11-20 DIAGNOSIS — F03911 Unspecified dementia, unspecified severity, with agitation: Secondary | ICD-10-CM

## 2022-11-20 DIAGNOSIS — Z515 Encounter for palliative care: Secondary | ICD-10-CM

## 2022-11-20 NOTE — Progress Notes (Signed)
    [facility] Consult Note Telephone: [phone]  Fax: [phone]    Date of encounter: 11/20/22 3:00PM PATIENT NAME: Sarah Velasquez [address] [place] [address]   [phone] (home)  DOB: 03-17-1946 MRN: [id number] PRIMARY CARE PROVIDER:    Virgie Dad, NP-[facility]    REFERRING PROVIDER:   Virgie Dad, NP-[facility]    RESPONSIBLE PARTY:    Contact Information     Name Relation Home Work Mobile   [place] Son [phone]     Peri Jefferson Daughter   [phone]        I met face to face with patient in the facility. Palliative Care was asked to follow this patient by consultation request of  Virgie Dad, NP-[facility]  to address advance care planning and complex medical decision making. This is a follow up visit.                                    ASSESSMENT AND PLAN / RECOMMENDATIONS:   Advance Care Planning/Goals of Care: Goals include to maximize quality of life and symptom management.   Family is interested in hospice service in the future when patient qualifies for it.  CODE STATUS: DNR  Symptom Management/Plan:  Dementia: Ongoing memory loss, confusion. Provide redirection, cueing and assistance with ADLs as needed.   FAST 6D.  FLACC 0. Ambulatory , high fall risk. Fall precautions.   Agitation: Managed with Deparkote.  Use De-escalation techniques \. Routine CBC CMP Depakote level.  Depression and anxiety: At baseline.  Psych consult as planned/needed.  Continue Buspar, Prozac and Lorazapam.   Follow up Palliative Care Visit: Palliative care will continue to follow for complex medical decision making, advance care planning, and clarification of goals. Return in 4-6 weeks or prn.  HOSPICE ELIGIBILITY/DIAGNOSIS: TBD  Chief Complaint: Palliative Medicine follow up visit.   HISTORY OF PRESENT ILLNESS:  Sarah Velasquez is a 77 y.o. year old female  with multiple morbidities requiring close  monitoring, with high risk of complications and mortality: Advanced dementia, MDD, anxiety, T2DM, hyperlipidemia, OA,  DDD, GERD, Vitamin D deficiency.  Patient is cooperative during visit, denied pain/discomfort, in no acute distress, no agitation.  History obtained from review of EMR, discussion with primary team, and interview with family, facility staff/caregiver and/or Sarah Velasquez.  Patient in no acute distress, denied pain/discomfort. I reviewed available labs, medications, imaging, studies and related documents from the EMR.  All 10 point systems reviewed and are negative except as documented in history of present illness above Records reviewed and summarized above.   I spent 40 minutes providing this consultation; this includes time spent with patient/family, chart review and documentation. More than 50% of the time in this consultation was spent on counseling and coordinating communication   Thank you for the opportunity to participate in the care of Sarah Velasquez. Please call our office at [phone] if we can be of additional assistance.   Teodoro Spray, NP

## 2023-06-28 ENCOUNTER — Other Ambulatory Visit: Payer: Self-pay

## 2023-06-28 ENCOUNTER — Emergency Department

## 2023-06-28 ENCOUNTER — Emergency Department
Admission: EM | Admit: 2023-06-28 | Discharge: 2023-06-28 | Disposition: A | Discharge status: Home / Self Care | Attending: Emergency Medicine | Admitting: Emergency Medicine

## 2023-06-28 DIAGNOSIS — F0390 Unspecified dementia without behavioral disturbance: Secondary | ICD-10-CM | POA: Diagnosis not present

## 2023-06-28 DIAGNOSIS — S32401A Unspecified fracture of right acetabulum, initial encounter for closed fracture: Secondary | ICD-10-CM

## 2023-06-28 DIAGNOSIS — G309 Alzheimer's disease, unspecified: Secondary | ICD-10-CM | POA: Diagnosis not present

## 2023-06-28 DIAGNOSIS — S72009A Fracture of unspecified part of neck of unspecified femur, initial encounter for closed fracture: Secondary | ICD-10-CM | POA: Diagnosis not present

## 2023-06-28 DIAGNOSIS — W19XXXA Unspecified fall, initial encounter: Secondary | ICD-10-CM | POA: Insufficient documentation

## 2023-06-28 DIAGNOSIS — Z515 Encounter for palliative care: Secondary | ICD-10-CM

## 2023-06-28 DIAGNOSIS — S79911A Unspecified injury of right hip, initial encounter: Secondary | ICD-10-CM | POA: Diagnosis present

## 2023-06-28 DIAGNOSIS — S32424A Nondisplaced fracture of posterior wall of right acetabulum, initial encounter for closed fracture: Secondary | ICD-10-CM | POA: Insufficient documentation

## 2023-06-28 MED ORDER — ACETAMINOPHEN 500 MG PO TABS
1000.0000 mg | ORAL_TABLET | Freq: Once | ORAL | Status: AC
Start: 2023-06-28 — End: 2023-06-28
  Administered 2023-06-28: 1000 mg via ORAL
  Filled 2023-06-28: qty 2

## 2023-06-28 MED ORDER — ONDANSETRON HCL 4 MG/2ML IJ SOLN
4.0000 mg | Freq: Once | INTRAMUSCULAR | Status: AC
  Administered 2023-06-28: 4 mg via INTRAVENOUS
  Filled 2023-06-28: qty 2

## 2023-06-28 MED ORDER — MORPHINE SULFATE (PF) 4 MG/ML IV SOLN
4.0000 mg | Freq: Once | INTRAVENOUS | Status: AC
  Administered 2023-06-28: 4 mg via INTRAVENOUS
  Filled 2023-06-28: qty 1

## 2023-06-28 NOTE — ED Notes (Signed)
Patient transported to CT 

## 2023-06-28 NOTE — Progress Notes (Signed)
I was called by the ED staff regarding this patient's imaging.  The patient has a significantly displaced acetabular fracture on the right side.  I called the patient's son who is medical power of attorney to discuss these findings and determine the best treatment plan for the patient.  He states that she lives at a memory care facility and has severe dementia.  She is ambulatory, but requires significant assistance.  She has had a prior left hip IM nail performed by Dr. Rosita Kea in 2021 and the patient's son notes that she had significant difficulty with the rehab process.  He does not think that she is a good surgical candidate at this time.  She is on hospice and comfort care currently.  We did discuss that without surgery, the patient would be unlikely to bear weight without pain.  She would also likely be bedbound and would be at risk of complications arising from this.  The patient's son understands and would like to proceed with further conservative management and nonoperative treatment.  Recommend appropriate palliative care and pain control.  If the patient is able to be mobilized, she would likely be flatfoot weightbearing for at least 8 weeks.  Precautions should be made to prevent pressure sores, pneumonia, DVT, etc. that may arise from being bedbound.

## 2023-06-28 NOTE — ED Notes (Signed)
Called [facility] to check on where pt.is on pick up list she is 4th on the list

## 2023-06-28 NOTE — Consult Note (Signed)
Initial Consultation Note   Patient: Sarah Velasquez UJW:[id number] DOB: 25-Aug-1946 PCP: Melonie Florida, FNP DOA: 06/28/2023 DOS: the patient was seen and examined on 06/28/2023 Primary service: Merwyn Katos, MD  Referring physician: Vicente Males MD  Reason for consult: Hip Fracture   Assessment/Plan: Assessment and Plan: Hip fracture [facility]) Noted hip fracture status post fall at memory care facility Preliminarily nonoperative after orthopedic surgery reviewed Family would like for patient to go to hospice facility and is declining admission at present [facility] care nurse Shawn has arranged transport for patient to hospice facility with plan for pain control at site Will otherwise sign off        [facility] will sign off at present, please call us again when needed.  HPI: [name] is a 77 y.o. female with past medical history of GERD, hyperlipidemia, dementia at memory care facility presenting with fall and right hip fracture.  Per report, patient received from [facility] facility with unwitnessed fall.  No reports of prior episodes like this in the past.  Baseline history of dementia.  Only oriented to self.  Positive generalized confusion.  No reported chest pain, shortness of breath nausea or vomiting.  Currently on hospice and comfort care. Presented to the ER afebrile, hemodynamically stable.  Noted mildly displaced fracture of the right inferior pubic ramus and acetabular fracture.  Formally evaluated by Dr. Allena Katz with orthopedic surgery.  Recommendation for palliative care and pain control and discussion with patient's son who is the medical power of attorney.  After discussion with the family at the bedside as well as Shawn with the [facility] care, family would like for patient to be transported to hospice facility if possible.  Review of Systems: As mentioned in the history of present illness. All other systems reviewed and are negative. Past Medical History:  Diagnosis Date   Anxiety     Chest pain 02/15-16/2009   [facility]  chest pain rule out //stress Myoview negative 10/21/2007//Stress cardiolite ;apical thinning ,negative ischemia (09/14/2003)   Diabetes mellitus    typeII   Diverticulosis of colon 09/2003   colonoscopy ext.and internal hemorrhoids (Dr. Mechele Collin )12/03/2006// colonoscopy polyps multiple  benign; interal hemorrhoids ,divertics 09/16/2003   Drug-induced constipation    Environmental allergies    GERD (gastroesophageal reflux disease)    EGD hiatal hernia; esophagitis/sigmoidoscopy WNL (Dr. Mechele Collin) 03/04/1996: ABD. U/S  WNL (06/1996)//EGD multiple polyps ;small H.H (09/16/2003)   H/O hiatal hernia    Hyperlipidemia 05/1995   Osteopenia 12/2011   spine -1.8, hip -0.7   PONV (postoperative nausea and vomiting)    Right lumbar radiculitis 2013   DDD, spondylosis, L3 radiculitis (MRI 07/2012) - ESI 09/2011 (Dr Yves Dill)    Vertigo    hx of   Past Surgical History:  Procedure Laterality Date   ABDOMINAL HYSTERECTOMY  1986   ovaries intact Uterine prolapse   COLONOSCOPY  02/2014   diverticulosis, int hem, rpt 5 yrs Mechele Collin)   COLONOSCOPY W/ POLYPECTOMY     DEXA  12/2011   spine -1.8, hip -0.7   ESI  09/2011   ([facility])   HAND SURGERY  01/23/2002    Three fingers amputated.  Lawnmower   accident.   INTRAMEDULLARY (IM) NAIL INTERTROCHANTERIC Left 05/13/2020   Procedure: INTRAMEDULLARY (IM) NAIL INTERTROCHANTRIC;  Surgeon: Kennedy Bucker, MD;  Location: [facility] ORS;  Service: Orthopedics;  Laterality: Left;   LUMBAR LAMINECTOMY/DECOMPRESSION MICRODISCECTOMY Right 11/06/2012   LUMBAR LAMINECTOMY/DECOMPRESSION MICRODISCECTOMY 1 LEVEL;  Surgeon: Tia Alert, MD;  Right  lumbar three-four extraforaminal diskectomy   NASAL SINUS SURGERY  1978   TRIGGER FINGER RELEASE Left 04/20/2015   Procedure: LEFT THUMB TRIGGER FINGER RELEASE;  Surgeon: Mack Hook, MD;  Location: [facility];  Service: Orthopedics;  Laterality: Left;   TUBAL LIGATION      VAGINAL DELIVERY     NSVD x 2   Social History:  reports that she has never smoked. She has never used smokeless tobacco. She reports current alcohol use. She reports that she does not use drugs.  Allergies  Allergen Reactions   Fish Allergy Anaphylaxis   Shellfish Allergy Anaphylaxis   Azithromycin     REACTION: rash   Meperidine Hcl     REACTION: nausea   Neurontin [Gabapentin] Other (See Comments)    Incoherent   Omeprazole     REACTION: rash ([facility] brand)    Family History  Problem Relation Age of Onset   Heart disease Mother 71       MI   COPD Father        emphysema   Cancer Father        esophageal and brain cancer   Heart disease Father 52       CABG   Alcohol abuse Sister 36       etoh   Heart disease Brother 3       MI   Breast cancer Neg Hx     Prior to Admission medications   Medication Sig Start Date End Date Taking? Authorizing Provider  acetaminophen (TYLENOL) 325 MG tablet Take 2 tablets (650 mg total) by mouth every 8 (eight) hours. 05/19/20   Lynn Ito, MD  aspirin 81 MG tablet Take 81 mg by mouth daily.    [provider]  atorvastatin (LIPITOR) 80 MG tablet Take 1 tablet (80 mg total) by mouth daily. 09/14/15   Eustaquio Boyden, MD  bisacodyl (DULCOLAX) 10 MG suppository Place 1 suppository (10 mg total) rectally daily as needed for moderate constipation. 05/19/20   Lynn Ito, MD  busPIRone (BUSPAR) 10 MG tablet Take 10 mg by mouth 2 (two) times daily. 03/22/20   [provider]  cetirizine (ZYRTEC ALLERGY) 10 MG tablet Take 10 mg by mouth daily.      [provider]  cholecalciferol (VITAMIN D) 25 MCG tablet Take 1 tablet (1,000 Units total) by mouth daily. 05/20/20   Lynn Ito, MD  docusate sodium (COLACE) 100 MG capsule Take 200 mg by mouth daily.     [provider]  donepezil (ARICEPT) 10 MG tablet Take 10 mg by mouth at bedtime. 04/15/20   [provider]  enoxaparin (LOVENOX) 40 MG/0.4ML  injection Inject 0.4 mLs (40 mg total) into the skin daily for 14 days. 05/18/20 06/01/20  Evon Slack, PA-C  escitalopram (LEXAPRO) 10 MG tablet Take 10 mg by mouth daily.     [provider]  ferrous fumarate-b12-vitamic C-folic acid (TRINSICON / FOLTRIN) capsule Take 1 capsule by mouth 2 (two) times daily after a meal. 05/19/20   Lynn Ito, MD  glucose blood (ONE TOUCH ULTRA TEST) test strip 1 each by Other route daily. Use to check sugar daily Dx: E11.9 **ONE TOUCH ULTRA BLUE** 10/22/14   Eustaquio Boyden, MD  magnesium hydroxide (MILK OF MAGNESIA) 400 MG/5ML suspension Take 30 mLs by mouth daily as needed for mild constipation. 05/19/20   Lynn Ito, MD  metFORMIN (GLUCOPHAGE) 500 MG tablet Take 500 mg by mouth 2 (two) times daily. 05/06/20  [provider]  methocarbamol (ROBAXIN) 500 MG tablet Take 1 tablet (500 mg total) by mouth every 8 (eight) hours as needed for muscle spasms. 05/19/20   Lynn Ito, MD  Multiple Vitamin (MULTIVITAMIN WITH MINERALS) TABS tablet Take 1 tablet by mouth daily. 05/20/20   Lynn Ito, MD  [facility] DELICA LANCETS 33G MISC USE ONE LANCET ONCE DAILY AS DIRECTED 11/02/14   Eustaquio Boyden, MD  oxyCODONE-acetaminophen (PERCOCET/ROXICET) 5-325 MG tablet Take 1 tablet by mouth every 6 (six) hours as needed for moderate pain. 05/17/20   Evon Slack, PA-C  QUEtiapine (SEROQUEL) 25 MG tablet Take 25 mg by mouth at bedtime. 05/04/20   [provider]    Physical Exam: Vitals:   06/28/23 1206  BP: (!) 104/52  Pulse: 70  Resp: 16  Temp: 97.8 F (36.6 C)  TempSrc: Axillary  SpO2: 93%   Physical Exam Constitutional:      Comments: Underweight    HENT:     Head: Normocephalic and atraumatic.     Nose: Nose normal.     Mouth/Throat:     Mouth: Mucous membranes are moist.  Eyes:     Pupils: Pupils are equal, round, and reactive to light.  Cardiovascular:     Rate and Rhythm: Normal rate and regular rhythm.  Pulmonary:      Effort: Pulmonary effort is normal.  Abdominal:     General: Bowel sounds are normal.  Musculoskeletal:     Comments: + TTP and malrotation of RLE   Skin:    General: Skin is dry.  Neurological:     Comments: + generalized confusion    Psychiatric:        Mood and Affect: Mood normal.     Data Reviewed:   There are no new results to review at this time.    Family Communication: Family at the bedside  Primary team communication: Yes  Thank you very much for involving [place] in the care of your patient.  Author: Floydene Flock, MD 06/28/2023 3:58 PM  For on call review [email or web address].

## 2023-06-28 NOTE — Progress Notes (Signed)
[facility] liaison note     This patient is a current hospice patient with [facility].    Liaison will continue to follow for any discharge planning needs and to coordinate continuation of hospice care.    Please don't hesitate to call with any Hospice related questions or concerns.    Thank you for the opportunity to participate in this patient's care.  Glenna Fellows, BSN, RN, OCN [employer] [phone]

## 2023-06-28 NOTE — ED Provider Notes (Signed)
[facility] Provider Note   Event Date/Time   First MD Initiated Contact with Patient 06/28/23 1214     (approximate) History  Fall  HPI Sarah Velasquez is a 77 y.o. female with a history of Alzheimer's on palliative care who presents after a unwitnessed fall complaining of right hip pain.  Patient is confused at baseline and unable to answer any further questions other than stating that her right hip hurts.  Patient is unclear as to whether she hit her head or face however patient does have bruising to the left cheekbone ROS: Unable to assess   Physical Exam  Triage Vital Signs: ED Triage Vitals [06/28/23 1206]  Encounter Vitals Group     BP (!) 104/52     Systolic BP Percentile      Diastolic BP Percentile      Pulse Rate 70     Resp 16     Temp 97.8 F (36.6 C)     Temp Source Axillary     SpO2 93 %     Weight      Height      Head Circumference      Peak Flow      Pain Score      Pain Loc      Pain Education      Exclude from Growth Chart    Most recent vital signs: Vitals:   06/28/23 1206  BP: (!) 104/52  Pulse: 70  Resp: 16  Temp: 97.8 F (36.6 C)  SpO2: 93%   General: Awake, cooperative CV:  Good peripheral perfusion.  Resp:  Normal effort.  Abd:  No distention.  Other:  Elderly overweight Caucasian female resting comfortably in no acute distress.  Tenderness palpation over the right hip ED Results / Procedures / Treatments  Labs (all labs ordered are listed, but only abnormal results are displayed) Labs Reviewed  COMPREHENSIVE METABOLIC PANEL  CBC WITH DIFFERENTIAL/PLATELET  URINALYSIS, ROUTINE W REFLEX MICROSCOPIC   RADIOLOGY ED MD interpretation: CT of the head without contrast interpreted by me shows no evidence of acute abnormalities including no intracerebral hemorrhage, obvious masses, or significant edema  CT of the cervical spine interpreted by me does not show any evidence of acute abnormalities including no acute  fracture, malalignment, height loss, or dislocation  CT of the maxillofacial structures show no evidence of acute abnormalities  X-ray of the right hip shows a mildly displaced inferior pubic rami fracture  CT of the right hip pending however evaluation by me and Dr. Signa Kell shows an acetabular fracture on the right -Agree with radiology assessment Official radiology report(s): CT Head Wo Contrast  Result Date: 06/28/2023 CLINICAL DATA:  Neck trauma (Age >= 65y); Facial trauma, blunt; Head trauma, minor (Age >= 65y) EXAM: CT HEAD WITHOUT CONTRAST CT MAXILLOFACIAL WITHOUT CONTRAST CT CERVICAL SPINE WITHOUT CONTRAST TECHNIQUE: Multidetector CT imaging of the head, cervical spine, and maxillofacial structures were performed using the standard protocol without intravenous contrast. Multiplanar CT image reconstructions of the cervical spine and maxillofacial structures were also generated. RADIATION DOSE REDUCTION: This exam was performed according to the departmental dose-optimization program which includes automated exposure control, adjustment of the mA and/or kV according to patient size and/or use of iterative reconstruction technique. COMPARISON:  CT Head 05/15/20 FINDINGS: CT HEAD FINDINGS Brain: No hemorrhage. No hydrocephalus. No extra-axial fluid collection. No CT evidence of an acute cortical infarct. No mass effect. No mass lesion. Vascular: No hyperdense vessel or unexpected  calcification. Skull: Normal. Negative for fracture or focal lesion. Other: None. CT MAXILLOFACIAL FINDINGS Osseous: No fracture or mandibular dislocation. No destructive process. Orbits: Negative. No traumatic or inflammatory finding. Sinuses: No middle ear or mastoid effusion. Paranasal sinuses are clear. Orbits are unremarkable. Soft tissues: Negative. CT CERVICAL SPINE FINDINGS Alignment: Trace retrolisthesis of C4 on C5 Skull base and vertebrae: No acute fracture. No primary bone lesion or focal pathologic process.  Soft tissues and spinal canal: No prevertebral fluid or swelling. No visible canal hematoma. Disc levels:  No evidence of high-grade spinal canal stenosis Upper chest: Negative Other: Sebaceous cyst along the midline lower neck IMPRESSION: 1. No acute intracranial abnormality. 2. No acute facial bone fracture. 3. No acute fracture or traumatic subluxation of the cervical spine. Electronically Signed   By: Lorenza Cambridge M.D.   On: 06/28/2023 12:57   CT Cervical Spine Wo Contrast  Result Date: 06/28/2023 CLINICAL DATA:  Neck trauma (Age >= 65y); Facial trauma, blunt; Head trauma, minor (Age >= 65y) EXAM: CT HEAD WITHOUT CONTRAST CT MAXILLOFACIAL WITHOUT CONTRAST CT CERVICAL SPINE WITHOUT CONTRAST TECHNIQUE: Multidetector CT imaging of the head, cervical spine, and maxillofacial structures were performed using the standard protocol without intravenous contrast. Multiplanar CT image reconstructions of the cervical spine and maxillofacial structures were also generated. RADIATION DOSE REDUCTION: This exam was performed according to the departmental dose-optimization program which includes automated exposure control, adjustment of the mA and/or kV according to patient size and/or use of iterative reconstruction technique. COMPARISON:  CT Head 05/15/20 FINDINGS: CT HEAD FINDINGS Brain: No hemorrhage. No hydrocephalus. No extra-axial fluid collection. No CT evidence of an acute cortical infarct. No mass effect. No mass lesion. Vascular: No hyperdense vessel or unexpected calcification. Skull: Normal. Negative for fracture or focal lesion. Other: None. CT MAXILLOFACIAL FINDINGS Osseous: No fracture or mandibular dislocation. No destructive process. Orbits: Negative. No traumatic or inflammatory finding. Sinuses: No middle ear or mastoid effusion. Paranasal sinuses are clear. Orbits are unremarkable. Soft tissues: Negative. CT CERVICAL SPINE FINDINGS Alignment: Trace retrolisthesis of C4 on C5 Skull base and vertebrae: No  acute fracture. No primary bone lesion or focal pathologic process. Soft tissues and spinal canal: No prevertebral fluid or swelling. No visible canal hematoma. Disc levels:  No evidence of high-grade spinal canal stenosis Upper chest: Negative Other: Sebaceous cyst along the midline lower neck IMPRESSION: 1. No acute intracranial abnormality. 2. No acute facial bone fracture. 3. No acute fracture or traumatic subluxation of the cervical spine. Electronically Signed   By: Lorenza Cambridge M.D.   On: 06/28/2023 12:57   CT Maxillofacial WO CM  Result Date: 06/28/2023 CLINICAL DATA:  Neck trauma (Age >= 65y); Facial trauma, blunt; Head trauma, minor (Age >= 65y) EXAM: CT HEAD WITHOUT CONTRAST CT MAXILLOFACIAL WITHOUT CONTRAST CT CERVICAL SPINE WITHOUT CONTRAST TECHNIQUE: Multidetector CT imaging of the head, cervical spine, and maxillofacial structures were performed using the standard protocol without intravenous contrast. Multiplanar CT image reconstructions of the cervical spine and maxillofacial structures were also generated. RADIATION DOSE REDUCTION: This exam was performed according to the departmental dose-optimization program which includes automated exposure control, adjustment of the mA and/or kV according to patient size and/or use of iterative reconstruction technique. COMPARISON:  CT Head 05/15/20 FINDINGS: CT HEAD FINDINGS Brain: No hemorrhage. No hydrocephalus. No extra-axial fluid collection. No CT evidence of an acute cortical infarct. No mass effect. No mass lesion. Vascular: No hyperdense vessel or unexpected calcification. Skull: Normal. Negative for fracture or focal lesion. Other: None.  CT MAXILLOFACIAL FINDINGS Osseous: No fracture or mandibular dislocation. No destructive process. Orbits: Negative. No traumatic or inflammatory finding. Sinuses: No middle ear or mastoid effusion. Paranasal sinuses are clear. Orbits are unremarkable. Soft tissues: Negative. CT CERVICAL SPINE FINDINGS Alignment:  Trace retrolisthesis of C4 on C5 Skull base and vertebrae: No acute fracture. No primary bone lesion or focal pathologic process. Soft tissues and spinal canal: No prevertebral fluid or swelling. No visible canal hematoma. Disc levels:  No evidence of high-grade spinal canal stenosis Upper chest: Negative Other: Sebaceous cyst along the midline lower neck IMPRESSION: 1. No acute intracranial abnormality. 2. No acute facial bone fracture. 3. No acute fracture or traumatic subluxation of the cervical spine. Electronically Signed   By: Lorenza Cambridge M.D.   On: 06/28/2023 12:57   DG Hip Unilat W or Wo Pelvis 2-3 Views Right  Result Date: 06/28/2023 CLINICAL DATA:  fall w/severe r hip pain EXAM: DG HIP (WITH OR WITHOUT PELVIS) 2-3V RIGHT COMPARISON:  06/11/2012 FINDINGS: There is mildly displaced overlapping fracture of the right inferior pubic ramus. There is subtle discontinuity along the cortex of the lateral portion of the right femoral head; however, the trabecular pattern is intact, favoring probable projectional artifact. However, clinical correlation is recommended to determine the need for further imaging with CT scan pelvis. Otherwise no acute fracture or dislocation. Metallic hardware noted in the proximal left femur. The hardware is intact. No periprosthetic fracture or lucency. Old avulsed lesser trochanter noted. No aggressive osseous lesion. Normal and symmetric sacroiliac joints. Visualized sacral arcuate lines are unremarkable. Unremarkable symphysis pubis. There are mild degenerative changes of bilateral hip joints without significant joint space narrowing. Osteophytosis of the superior acetabulum. No radiopaque foreign bodies. IMPRESSION: *Mildly displaced fracture of the right inferior pubic ramus. *There is subtle discontinuity along the cortex of the lateral portion of the right femoral head; however, the trabecular pattern is intact, favoring probable projectional artifact. However, clinical  correlation is recommended to determine the need for further imaging with CT scan pelvis. Electronically Signed   By: Jules Schick M.D.   On: 06/28/2023 12:52   PROCEDURES: Critical Care performed: No .1-3 Lead EKG Interpretation  Performed by: Merwyn Katos, MD Authorized by: Merwyn Katos, MD     Interpretation: normal     ECG rate:  71   ECG rate assessment: normal     Rhythm: sinus rhythm     Ectopy: none     Conduction: normal    MEDICATIONS ORDERED IN ED: Medications  acetaminophen (TYLENOL) tablet 1,000 mg (has no administration in time range)  morphine (PF) 4 MG/ML injection 4 mg (has no administration in time range)  ondansetron (ZOFRAN) injection 4 mg (has no administration in time range)   IMPRESSION / MDM / ASSESSMENT AND PLAN / ED COURSE  I reviewed the triage vital signs and the nursing notes.                             The patient is on the cardiac monitor to evaluate for evidence of arrhythmia and/or significant heart rate changes. Patient's presentation is most consistent with acute presentation with potential threat to life or bodily function. Patient is a 77 year old female that presents for right hip pain Workup: XR hip Findings: Right acetabular fracture without dislocation Consult: Orthopedic Surgery-in discussion with Dr. Allena Katz, this would possibly require surgery however given the fracture pattern and family's reticence for surgery, she will  be admitted to the internal medicine service for further evaluation and management as well as likely rehab.  Hospitalist vs palliative medicine to admit depending on transportation  Patient does not currently demonstrate complications of fracture such as compartment syndrome, arterial or nerve injury.  Interventions: analgesia Disposition: Admit   FINAL CLINICAL IMPRESSION(S) / ED DIAGNOSES   Final diagnoses:  Fall, initial encounter  Closed nondisplaced fracture of right acetabulum, unspecified portion of  acetabulum, initial encounter (HCC)   Rx / DC Orders   ED Discharge Orders     None      Note:  This document was prepared using Dragon voice recognition software and may include unintentional dictation errors.   Merwyn Katos, MD 06/28/23 [phone]

## 2023-06-28 NOTE — ED Triage Notes (Signed)
Pt to ED via [facility] from [facility] for c/o unwitnessed fall. Pt complains of right hip pain, allergic to contrast dye. Hx dementia, alert to self.   BP 111/70 HR 72 94% room air

## 2023-06-28 NOTE — Assessment & Plan Note (Signed)
Noted hip fracture status post fall at memory care facility Preliminarily nonoperative after orthopedic surgery reviewed Family would like for patient to go to hospice facility and is declining admission at present [facility] care nurse Shawn has arranged transport for patient to hospice facility with plan for pain control at site Will otherwise sign off

## 2023-06-28 NOTE — ED Notes (Signed)
Report provided to [facility]. Pt stable at time of transport. Pt safely transferred to EMS stretcher. Pt belongings in possession of pt son Casimiro Needle. All required transfer documents provided.

## 2023-06-28 NOTE — ED Notes (Signed)
Consent for transport obtained from pt son and healthcare POA.

## 2023-08-05 DEATH — deceased
